# Patient Record
Sex: Male | Born: 1945 | Race: White | Hispanic: No | Marital: Married | Smoking: Never smoker
Health system: Southern US, Community
[De-identification: ages and names within clinical notes are randomized; demographics above are authoritative.]

## PROBLEM LIST (undated history)

## (undated) DIAGNOSIS — N189 Chronic kidney disease, unspecified: Secondary | ICD-10-CM

## (undated) DIAGNOSIS — G35 Multiple sclerosis: Secondary | ICD-10-CM

## (undated) HISTORY — PX: CHOLECYSTECTOMY: SHX55

## (undated) HISTORY — PX: BACK SURGERY: SHX140

## (undated) HISTORY — PX: EYE SURGERY: SHX253

## (undated) HISTORY — DX: Chronic kidney disease, unspecified: N18.9

---

## 2019-02-03 DIAGNOSIS — N312 Flaccid neuropathic bladder, not elsewhere classified: Secondary | ICD-10-CM | POA: Diagnosis present

## 2019-04-11 DIAGNOSIS — G35 Multiple sclerosis: Secondary | ICD-10-CM | POA: Insufficient documentation

## 2020-04-03 ENCOUNTER — Other Ambulatory Visit: Payer: Self-pay

## 2020-04-03 ENCOUNTER — Emergency Department (HOSPITAL_COMMUNITY)
Admission: EM | Admit: 2020-04-03 | Discharge: 2020-04-03 | Disposition: A | Discharge status: Home / Self Care | Payer: Medicare Other | Attending: Emergency Medicine | Admitting: Emergency Medicine

## 2020-04-03 ENCOUNTER — Encounter (HOSPITAL_COMMUNITY): Payer: Self-pay | Admitting: Emergency Medicine

## 2020-04-03 DIAGNOSIS — R008 Other abnormalities of heart beat: Secondary | ICD-10-CM | POA: Diagnosis not present

## 2020-04-03 DIAGNOSIS — F17200 Nicotine dependence, unspecified, uncomplicated: Secondary | ICD-10-CM | POA: Insufficient documentation

## 2020-04-03 DIAGNOSIS — R001 Bradycardia, unspecified: Secondary | ICD-10-CM | POA: Insufficient documentation

## 2020-04-03 DIAGNOSIS — I498 Other specified cardiac arrhythmias: Secondary | ICD-10-CM

## 2020-04-03 HISTORY — DX: Multiple sclerosis: G35

## 2020-04-03 LAB — BASIC METABOLIC PANEL
Anion gap: 9 (ref 5–15)
BUN: 10 mg/dL (ref 8–23)
CO2: 27 mmol/L (ref 22–32)
Calcium: 9.1 mg/dL (ref 8.9–10.3)
Chloride: 103 mmol/L (ref 98–111)
Creatinine, Ser: 0.81 mg/dL (ref 0.61–1.24)
GFR, Estimated: >60 mL/min (ref >60)
Glucose, Bld: 100 mg/dL — ABNORMAL HIGH (ref 70–99)
Potassium: 4.2 mmol/L (ref 3.5–5.1)
Sodium: 139 mmol/L (ref 135–145)

## 2020-04-03 LAB — CBC
HCT: 44.2 % (ref 39.0–52.0)
Hemoglobin: 14.1 g/dL (ref 13.0–17.0)
MCH: 31.7 pg (ref 26.0–34.0)
MCHC: 31.9 g/dL (ref 30.0–36.0)
MCV: 99.3 fL (ref 80.0–100.0)
Platelets: 236 10*3/uL (ref 150–400)
RBC: 4.45 MIL/uL (ref 4.22–5.81)
RDW: 13.1 % (ref 11.5–15.5)
WBC: 4.6 10*3/uL (ref 4.0–10.5)
nRBC: 0 % (ref 0.0–0.2)

## 2020-04-03 LAB — TSH: TSH: 1.718 u[IU]/mL (ref 0.350–4.500)

## 2020-04-03 LAB — TROPONIN I (HIGH SENSITIVITY): Troponin I (High Sensitivity): 5 ng/L (ref <18)

## 2020-04-03 LAB — MAGNESIUM: Magnesium: 2 mg/dL (ref 1.7–2.4)

## 2020-04-03 NOTE — Discharge Instructions (Addendum)
Contact a health care provider if you: Feel light-headed or dizzy. Almost faint. Feel weak or are easily fatigued during physical activity. Experience confusion or have memory problems. Get help right away if: You faint. You have: An irregular heartbeat (palpitations). Chest pain. Trouble breathing.

## 2020-04-03 NOTE — ED Provider Notes (Signed)
[facility] EMERGENCY DEPARTMENT Provider Note   CSN: [id number] Arrival date & time: 04/03/20  1247     History Chief Complaint  Patient presents with  . Bradycardia    Ronald Santiago is a 75 y.o. 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  The patient has had 2 prior episodes of the same with rates in the 40s and associated hypotension in July 2021 and August 2021.  Each time he was recommended to come to the ER however he did not want to go.  His wife who is at bedside states that it self resolved by the evening.  The patient has never been symptomatic from these episodes.  Today his nurse took his pulse which was in the 30s and stated that he needed to come to the emergency department immediately.  His blood pressure was in the 90s systolic.  He states that his normal blood pressure is about 110 systolic.  He denies feeling weak, lightheaded, presyncopal, having chest pain, shortness of breath, nausea or diaphoresis.  HPI     Past Medical History:  Diagnosis Date  . Multiple sclerosis, primary progressive (HCC)     There are no problems to display for this patient.   Past Surgical History:  Procedure Laterality Date  . BACK SURGERY    . CHOLECYSTECTOMY    . EYE SURGERY         History reviewed. No pertinent family history.  Social History   Tobacco Use  . Smoking status: Current Some Day Smoker  . Smokeless tobacco: Never Used    Home Medications Prior to Admission medications   Not on File    Allergies    Patient has no known allergies.  Review of Systems   Review of Systems Ten systems reviewed and are negative for acute change, except as noted in the HPI.   Physical Exam Updated Vital Signs BP 130/60   Pulse 66   Temp 98.2 F (36.8 C) (Oral)   Resp (!) 22    Ht 6' (1.829 m)   Wt 74.8 kg   SpO2 97%   BMI 22.38 kg/m   Physical Exam Vitals and nursing note reviewed.  Constitutional:      General: He is not in acute distress.    Appearance: He is well-developed and well-nourished. He is not diaphoretic.  HENT:     Head: Normocephalic and atraumatic.  Eyes:     General: No scleral icterus.    Conjunctiva/sclera: Conjunctivae normal.  Cardiovascular:     Rate and Rhythm: Normal rate and regular rhythm.     Heart sounds: Normal heart sounds.  Pulmonary:     Effort: Pulmonary effort is normal. No respiratory distress.     Breath sounds: Normal breath sounds.  Abdominal:     Palpations: Abdomen is soft.     Tenderness: There is no abdominal tenderness.     Comments: Suprapubic catheter in place  Musculoskeletal:        General: No edema.     Cervical back: Normal range of motion and neck supple.  Skin:    General: Skin is warm and dry.  Neurological:     Mental Status: He is alert.  Psychiatric:        Behavior: Behavior normal.     ED Results / Procedures / Treatments  Labs (all labs ordered are listed, but only abnormal results are displayed) Labs Reviewed  BASIC METABOLIC PANEL - Abnormal; Notable for the following components:      Result Value   Glucose, Bld 100 (*)    All other components within normal limits  CBC  MAGNESIUM  TSH  URINALYSIS, ROUTINE W REFLEX MICROSCOPIC  CBG MONITORING, ED  TROPONIN I (HIGH SENSITIVITY)  TROPONIN I (HIGH SENSITIVITY)    EKG EKG Interpretation  Date/Time:  Wednesday April 03 2020 18:29:45 EST Ventricular Rate:  67 PR Interval:    QRS Duration: 87 QT Interval:  400 QTC Calculation: 423 R Axis:   61 Text Interpretation: Sinus rhythm Ventricular trigeminy Abnormal ECG Confirmed by Gerhard Munch [phone]) on 04/03/2020 6:33:19 PM   Radiology No results found.  Procedures Procedures (including critical care time)  Medications Ordered in ED Medications - No data to  display  ED Course  I have reviewed the triage vital signs and the nursing notes.  Pertinent labs & imaging results that were available during my care of the patient were reviewed by me and considered in my medical decision making (see chart for details).    MDM Rules/Calculators/A&P                         75 year old male here with episodic bradycardia which is asymptomatic for the patient.  Patient appears to be having runs of ventricular bigeminy and trigeminy.  He does not feel presyncopal.  His blood pressures have been fairly stable here.  I ordered and reviewed labs which include troponin which is negative, CBC without abnormality, mag within normal limits, normal TSH, BMP without significant abnormality.  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  Patient seen and shared visit with Dr. Jeraldine Loots.  As the patient is asymptomatic do not feel that there is any significant need for intervention or admission at this time.  He may follow very closely in the outpatient setting with cardiology.  He should return to immediately if he becomes symptomatic in these states of slow heart rate. Final Clinical Impression(s) / ED Diagnoses Final diagnoses:  Bradycardia  Ventricular bigeminy    Rx / DC Orders ED Discharge Orders    None       Arthor Captain, PA-C 04/03/20 2347    Gerhard Munch, MD 04/04/20 [phone]

## 2020-04-03 NOTE — ED Notes (Signed)
Patient Alert and oriented to baseline. Stable and ambulatory to baseline. Patient verbalized understanding of the discharge instructions.  Patient belongings were taken by the patient.   

## 2020-04-03 NOTE — ED Triage Notes (Addendum)
Pt arrives to ED via [facility] with c/o of bradycardia. Pt lives at home where a [facility] nurse comes x1 a week, ran vitals today and found pts pulse to be 30. EMS found pt to be in ventricular bigeminy. Pt states he feels normal and at his baseline. Pt does have primary progressive MS. No CP, SOB, or increased lethargy. Has sacral wound and suprapubic catheter.

## 2020-06-20 DIAGNOSIS — R001 Bradycardia, unspecified: Secondary | ICD-10-CM | POA: Insufficient documentation

## 2020-06-20 DIAGNOSIS — I493 Ventricular premature depolarization: Secondary | ICD-10-CM | POA: Insufficient documentation

## 2020-06-24 ENCOUNTER — Emergency Department
Admission: EM | Admit: 2020-06-24 | Discharge: 2020-06-24 | Disposition: A | Discharge status: Home / Self Care | Payer: Medicare Other | Attending: Emergency Medicine | Admitting: Emergency Medicine

## 2020-06-24 ENCOUNTER — Emergency Department: Payer: Medicare Other

## 2020-06-24 ENCOUNTER — Other Ambulatory Visit: Payer: Self-pay

## 2020-06-24 DIAGNOSIS — Z9049 Acquired absence of other specified parts of digestive tract: Secondary | ICD-10-CM | POA: Diagnosis not present

## 2020-06-24 DIAGNOSIS — N390 Urinary tract infection, site not specified: Secondary | ICD-10-CM

## 2020-06-24 DIAGNOSIS — I700 Atherosclerosis of aorta: Secondary | ICD-10-CM | POA: Diagnosis not present

## 2020-06-24 DIAGNOSIS — F17200 Nicotine dependence, unspecified, uncomplicated: Secondary | ICD-10-CM | POA: Insufficient documentation

## 2020-06-24 DIAGNOSIS — R319 Hematuria, unspecified: Secondary | ICD-10-CM

## 2020-06-24 DIAGNOSIS — J984 Other disorders of lung: Secondary | ICD-10-CM | POA: Diagnosis not present

## 2020-06-24 DIAGNOSIS — D7389 Other diseases of spleen: Secondary | ICD-10-CM | POA: Diagnosis not present

## 2020-06-24 DIAGNOSIS — K7689 Other specified diseases of liver: Secondary | ICD-10-CM | POA: Insufficient documentation

## 2020-06-24 LAB — URINALYSIS, COMPLETE (UACMP) WITH MICROSCOPIC
Bacteria, UA: NONE SEEN
Bilirubin Urine: NEGATIVE
Glucose, UA: NEGATIVE mg/dL
Ketones, ur: NEGATIVE mg/dL
Nitrite: NEGATIVE
Protein, ur: 100 mg/dL — AB
Specific Gravity, Urine: 1.026 (ref 1.005–1.030)
Squamous Epithelial / HPF: NONE SEEN (ref 0–5)
WBC, UA: NONE SEEN WBC/hpf (ref 0–5)
pH: 5 (ref 5.0–8.0)

## 2020-06-24 LAB — CBC
HCT: 45.5 % (ref 39.0–52.0)
Hemoglobin: 15.8 g/dL (ref 13.0–17.0)
MCH: 33.3 pg (ref 26.0–34.0)
MCHC: 34.7 g/dL (ref 30.0–36.0)
MCV: 95.8 fL (ref 80.0–100.0)
Platelets: 244 10*3/uL (ref 150–400)
RBC: 4.75 MIL/uL (ref 4.22–5.81)
RDW: 13.5 % (ref 11.5–15.5)
WBC: 8 10*3/uL (ref 4.0–10.5)
nRBC: 0 % (ref 0.0–0.2)

## 2020-06-24 LAB — COMPREHENSIVE METABOLIC PANEL
ALT: 16 U/L (ref 0–44)
AST: 23 U/L (ref 15–41)
Albumin: 3.8 g/dL (ref 3.5–5.0)
Alkaline Phosphatase: 39 U/L (ref 38–126)
Anion gap: 9 (ref 5–15)
BUN: 12 mg/dL (ref 8–23)
CO2: 27 mmol/L (ref 22–32)
Calcium: 9.1 mg/dL (ref 8.9–10.3)
Chloride: 101 mmol/L (ref 98–111)
Creatinine, Ser: 0.9 mg/dL (ref 0.61–1.24)
GFR, Estimated: >60 mL/min (ref >60)
Glucose, Bld: 94 mg/dL (ref 70–99)
Potassium: 4.4 mmol/L (ref 3.5–5.1)
Sodium: 137 mmol/L (ref 135–145)
Total Bilirubin: 0.7 mg/dL (ref 0.3–1.2)
Total Protein: 7.7 g/dL (ref 6.5–8.1)

## 2020-06-24 MED ORDER — CEPHALEXIN 500 MG PO CAPS: 500.0000 mg | ORAL_CAPSULE | Freq: Two times a day (BID) | ORAL | 0 refills | Status: DC

## 2020-06-24 MED ORDER — PHENAZOPYRIDINE HCL 200 MG PO TABS: 200.0000 mg | ORAL_TABLET | Freq: Three times a day (TID) | ORAL | 0 refills | Status: DC | PRN

## 2020-06-24 MED ORDER — LEVOFLOXACIN 500 MG PO TABS: 500.0000 mg | ORAL_TABLET | Freq: Every day | ORAL | 0 refills | Status: AC

## 2020-06-24 MED ORDER — SODIUM CHLORIDE 0.9 % IV BOLUS
1000.0000 mL | Freq: Once | INTRAVENOUS | Status: AC
  Administered 2020-06-24: 1000 mL via INTRAVENOUS

## 2020-06-24 MED ORDER — IOHEXOL 300 MG/ML  SOLN
100.0000 mL | Freq: Once | INTRAMUSCULAR | Status: AC | PRN
  Administered 2020-06-24: 100 mL via INTRAVENOUS

## 2020-06-24 NOTE — ED Triage Notes (Addendum)
Pt comes with c/o blood in urine. Pt has suprapubic cath. Pt states last Tuesday his home health nurse came and attempted to change his foley multiple times. Pt states it was very painful and blood was present. Pt states she did it a 3rd time and left it in and advised them that if they didn't see any urine to seek medical attention.  Pt states 3 hours later he noticed urine but now it is cloudy and bloody. Pt states severe pain to penis area.  Pt denies any belly pain.

## 2020-06-24 NOTE — ED Notes (Signed)
Patient transported to CT 

## 2020-06-24 NOTE — ED Provider Notes (Addendum)
[facility] Emergency Department Provider Note  Time seen: 6:24 PM  I have reviewed the triage vital signs and the nursing notes.   HISTORY  Chief Complaint Hematuria   HPI Ronald Santiago is a 75 y.o. male with a past medical history of multiple sclerosis who presents to the emergency department for hematuria and lower abdominal discomfort.  According to the patient he had his catheter exchanged on Tuesday.  States normally when he has a suprapubic catheter exchange it is painless however during that exchange he states significant mental pain followed by bleeding.  The catheter was removed and a new catheter was placed patient has continued to have intermittent hematuria ever since.  Also states he is having lower abdominal pain and today noted blood coming from his penis.   Past Medical History:  Diagnosis Date  . Multiple sclerosis, primary progressive (HCC)     There are no problems to display for this patient.   Past Surgical History:  Procedure Laterality Date  . BACK SURGERY    . CHOLECYSTECTOMY    . EYE SURGERY      Prior to Admission medications   Not on File    No Known Allergies  No family history on file.  Social History Social History   Tobacco Use  . Smoking status: Current Some Day Smoker  . Smokeless tobacco: Never Used    Review of Systems Constitutional: Negative for fever. Cardiovascular: Negative for chest pain. Gastrointestinal: Lower abdominal discomfort. Genitourinary: Intermittent hematuria. Musculoskeletal: Negative for musculoskeletal complaints Neurological: Negative for headache All other ROS negative  ____________________________________________   PHYSICAL EXAM:  VITAL SIGNS: ED Triage Vitals  Enc Vitals Group     BP 06/24/20 1340 132/83     Pulse Rate 06/24/20 1340 (!) 58     Resp 06/24/20 1340 18     Temp 06/24/20 1340 98.2 F (36.8 C)     Temp Source 06/24/20 1340 Oral     SpO2 06/24/20 1340 93 %      Weight 06/24/20 1340 170 lb (77.1 kg)     Height 06/24/20 1340 6' (1.829 m)     Head Circumference --      Peak Flow --      Pain Score 06/24/20 1339 5     Pain Loc --      Pain Edu? --      Excl. in GC? --    Constitutional: Alert and oriented. Well appearing and in no distress. Eyes: Normal exam ENT      Head: Normocephalic and atraumatic.      Mouth/Throat: Mucous membranes are moist. Cardiovascular: Normal rate, regular rhythm.  Respiratory: Normal respiratory effort without tachypnea nor retractions. Breath sounds are clear  Gastrointestinal: Suprapubic catheter present very mild suprapubic tenderness.  No distention.  Abdomen otherwise benign. Musculoskeletal: Nontender with normal range of motion in all extremities.  Neurologic:  Normal speech and language. No gross focal neurologic deficits  Skin:  Skin is warm, dry and intact.  Psychiatric: Mood and affect are normal.   ____________________________________________   RADIOLOGY  CT scans essentially negative for acute abnormality besides mild bladder wall thickening.  Large stool burden.  ____________________________________________   INITIAL IMPRESSION / ASSESSMENT AND PLAN / ED COURSE  Pertinent labs & imaging results that were available during my care of the patient were reviewed by me and considered in my medical decision making (see chart for details).   Patient presents emergency department for intermittent hematuria and suprapubic discomfort.  Patient has a suprapubic catheter and states ever since it was replaced on Tuesday he has been having intermittent hematuria and discomfort.  Currently the patient appears well, very mild suprapubic tenderness.  Urine appears somewhat cloudy.  Urinalysis has resulted showing white blood cells but no significant red blood cells or bacteria.  We will send a urine culture.  I spoke to radiology regarding the patient's pain after Foley catheter was placed.  We decided proceed  with imaging of the abdomen including a CT cystogram to rule out bladder wall rupture or other abnormality.  Patient CT scans are essentially negative/reassuring besides mild bladder wall thickening along with large amount of leukocytes we will treat for possible urinary infection while the urine culture is pending.  Patient to follow-up with urology.  Patient states he has an allergy to Keflex in the past.  We will discontinue the Keflex prescription and changed to Levaquin.  Ronald Santiago was evaluated in Emergency Department on 06/24/2020 for the symptoms described in the history of present illness. He was evaluated in the context of the global COVID-19 pandemic, which necessitated consideration that the patient might be at risk for infection with the SARS-CoV-2 virus that causes COVID-19. Institutional protocols and algorithms that pertain to the evaluation of patients at risk for COVID-19 are in a state of rapid change based on information released by regulatory bodies including the CDC and federal and state organizations. These policies and algorithms were followed during the patient's care in the ED.  ____________________________________________   FINAL CLINICAL IMPRESSION(S) / ED DIAGNOSES  Hematuria UTI   Minna Antis, MD 06/24/20 Lacie Scotts, MD 06/24/20 1845

## 2020-06-27 LAB — URINE CULTURE: Culture: >=100000 — AB

## 2020-07-08 ENCOUNTER — Encounter: Payer: Self-pay | Admitting: Urology

## 2020-07-08 ENCOUNTER — Ambulatory Visit (INDEPENDENT_AMBULATORY_CARE_PROVIDER_SITE_OTHER): Payer: Medicare Other | Admitting: Urology

## 2020-07-08 ENCOUNTER — Other Ambulatory Visit: Payer: Self-pay

## 2020-07-08 VITALS — BP 110/73 | HR 56 | Ht 72.0 in | Wt 170.0 lb

## 2020-07-08 DIAGNOSIS — R300 Dysuria: Secondary | ICD-10-CM | POA: Diagnosis not present

## 2020-07-08 DIAGNOSIS — R310 Gross hematuria: Secondary | ICD-10-CM | POA: Diagnosis not present

## 2020-07-08 MED ORDER — CIPROFLOXACIN HCL 250 MG PO TABS: 250.0000 mg | ORAL_TABLET | Freq: Two times a day (BID) | ORAL | 0 refills | Status: DC

## 2020-07-08 NOTE — Progress Notes (Signed)
07/08/2020 12:57 PM   Ronald Santiago September 23, 1945 [id number]  Referring provider: Mick Sell, MD [address] [place],  [place] [address]  No chief complaint on file.   HPI: I was consulted to assess the patient who has a chronic suprapubic tube since December 2020 placed in [place].  Home health changes it uneventfully monthly.  On March 29 when it was changed the patient had blood from the penis blood in the urine and pain.  The bleeding gradually settle.  Bleeding came back April 4 and went to the emergency room.  He had a CT scan was placed on Levaquin which is now finished.  With certain movements or deep breath he feels some burning at the tip of the penis.  Otherwise he is back to baseline.  Sometimes his abdomen gets distended and then it goes down.  The catheter may or may not be draining well at the time but overall it appears to be draining well.  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  He has chronic sacral wounds.  He went to the emergency room with abdominal pain and blood from the penis.  ET scan demonstrated suprapubic catheter in place.  Mild bladder wall thickening.  No overdistended.  He had a positive urine culture sensitive to ciprofloxacin and no sensitivity done on Levaquin.  No other oral antibiotics sensitive to   PMH: Past Medical History:  Diagnosis Date  . Multiple sclerosis, primary progressive [facility])     Surgical History: Past Surgical History:  Procedure Laterality Date  . BACK SURGERY    . CHOLECYSTECTOMY    . EYE SURGERY      Home Medications:  Allergies as of 07/08/2020      Reactions   Cephalexin Anaphylaxis, Hives      Medication List       Accurate as of July 08, 2020 12:57 PM. If you have any questions, ask your nurse or doctor.        phenazopyridine 200 MG tablet Commonly known as: Pyridium Take 1 tablet (200 mg total) by mouth 3 (three) times daily as needed for pain.       Allergies:   Allergies  Allergen Reactions  . Cephalexin Anaphylaxis and Hives    Family History: No family history on file.  Social History:  reports that he has been smoking. He has never used smokeless tobacco. No history on file for alcohol use and drug use.  ROS:                                        Physical Exam: There were no vitals taken for this visit.  Constitutional:  Alert and oriented, No acute distress. HEENT: [place] AT, moist mucus membranes.  Trachea midline, no masses. Cardiovascular: No clubbing, cyanosis, or edema. Respiratory: Normal respiratory effort, no increased work of breathing. GI: Abdomen is soft, nontender, nondistended, no abdominal masses GU: No CVA tenderness.  Prepubic tube with clear urine Skin: No rashes, bruises or suspicious lesions. Lymph: No cervical or inguinal adenopathy. Neurologic: Grossly intact, no focal deficits, moving all 4 extremities. Psychiatric: Normal mood and affect.  Laboratory Data: Lab Results  Component Value Date   WBC 8.0 06/24/2020   HGB 15.8 06/24/2020   HCT 45.5 06/24/2020   MCV 95.8 06/24/2020   PLT 244 06/24/2020    Lab Results  Component Value Date   CREATININE  0.90 06/24/2020    No results found for: PSA  No results found for: TESTOSTERONE  No results found for: HGBA1C  Urinalysis    Component Value Date/Time   COLORURINE YELLOW (A) 06/24/2020 1342   APPEARANCEUR TURBID (A) 06/24/2020 1342   LABSPEC 1.026 06/24/2020 1342   PHURINE 5.0 06/24/2020 1342   GLUCOSEU NEGATIVE 06/24/2020 1342   HGBUR SMALL (A) 06/24/2020 1342   BILIRUBINUR NEGATIVE 06/24/2020 1342   KETONESUR NEGATIVE 06/24/2020 1342   PROTEINUR 100 (A) 06/24/2020 1342   NITRITE NEGATIVE 06/24/2020 1342   LEUKOCYTESUR LARGE (A) 06/24/2020 1342    Pertinent Imaging: Chart reviewed.  Urine reviewed.  Urine sent for culture.  Assessment & Plan: Patient is doing well and should continue with suprapubic catheter  changes.  Discomfort at tip of penis may be from residual cystitis with referred discomfort.  Because of the sensitivity I gave ciprofloxacin for another week.  We sent a urine for culture recognizing he could be colonized.  Recheck patient again in 6 months.  There are no diagnoses linked to this encounter.  No follow-ups on file.  Martina Sinner, MD  [facility] [address] [place], [place] [address] [phone]

## 2020-07-08 NOTE — Addendum Note (Signed)
Addended by: Milas Kocher A on: 07/08/2020 01:40 PM   Modules accepted: Orders

## 2020-07-11 LAB — CULTURE, URINE COMPREHENSIVE

## 2020-07-12 ENCOUNTER — Telehealth: Payer: Self-pay

## 2020-07-12 NOTE — Telephone Encounter (Signed)
-----   Message from Alfredo Martinez, MD sent at 07/12/2020 11:52 AM EDT -----   Please call patient and see if he still having pain at the tip of the penis since he took ciprofloxacin. If he still having the pain please have him try  fosfomycin 3 grm x1 dose.  There are not other medications that we can use orally          ----- Message ----- From: Lizbeth Bark, CMA Sent: 07/11/2020   2:14 PM EDT To: Alfredo Martinez, MD   ----- Message ----- From: Interface, [facility] Lab Results In Sent: 07/10/2020   7:37 AM EDT To: Jennette Kettle Clinical

## 2020-07-12 NOTE — Telephone Encounter (Signed)
Patient is no longer having pain.

## 2021-01-13 ENCOUNTER — Ambulatory Visit: Payer: Self-pay | Admitting: Urology

## 2021-01-24 ENCOUNTER — Other Ambulatory Visit: Payer: Self-pay

## 2021-01-24 ENCOUNTER — Emergency Department
Admission: EM | Admit: 2021-01-24 | Discharge: 2021-01-24 | Disposition: A | Discharge status: Home / Self Care | Payer: Medicare Other | Attending: Emergency Medicine | Admitting: Emergency Medicine

## 2021-01-24 DIAGNOSIS — Y833 Surgical operation with formation of external stoma as the cause of abnormal reaction of the patient, or of later complication, without mention of misadventure at the time of the procedure: Secondary | ICD-10-CM | POA: Diagnosis not present

## 2021-01-24 DIAGNOSIS — N99518 Other cystostomy complication: Secondary | ICD-10-CM | POA: Diagnosis not present

## 2021-01-24 DIAGNOSIS — F17200 Nicotine dependence, unspecified, uncomplicated: Secondary | ICD-10-CM | POA: Insufficient documentation

## 2021-01-24 DIAGNOSIS — N390 Urinary tract infection, site not specified: Secondary | ICD-10-CM | POA: Insufficient documentation

## 2021-01-24 DIAGNOSIS — T83010A Breakdown (mechanical) of cystostomy catheter, initial encounter: Secondary | ICD-10-CM

## 2021-01-24 DIAGNOSIS — N189 Chronic kidney disease, unspecified: Secondary | ICD-10-CM | POA: Diagnosis not present

## 2021-01-24 DIAGNOSIS — B9689 Other specified bacterial agents as the cause of diseases classified elsewhere: Secondary | ICD-10-CM | POA: Insufficient documentation

## 2021-01-24 LAB — URINALYSIS, ROUTINE W REFLEX MICROSCOPIC
Bacteria, UA: NONE SEEN
Bilirubin Urine: NEGATIVE
Glucose, UA: NEGATIVE mg/dL
Ketones, ur: NEGATIVE mg/dL
Nitrite: NEGATIVE
Protein, ur: 30 mg/dL — AB
RBC / HPF: >50 RBC/hpf — ABNORMAL HIGH (ref 0–5)
Specific Gravity, Urine: 1.008 (ref 1.005–1.030)
WBC, UA: >50 WBC/hpf — ABNORMAL HIGH (ref 0–5)
pH: 7 (ref 5.0–8.0)

## 2021-01-24 MED ORDER — CIPROFLOXACIN HCL 500 MG PO TABS
500.0000 mg | ORAL_TABLET | Freq: Once | ORAL | Status: AC
  Administered 2021-01-24: 500 mg via ORAL
  Filled 2021-01-24: qty 1

## 2021-01-24 MED ORDER — CIPROFLOXACIN HCL 500 MG PO TABS: 500.0000 mg | ORAL_TABLET | Freq: Two times a day (BID) | ORAL | 0 refills | Status: AC

## 2021-01-24 NOTE — ED Provider Notes (Signed)
[facility] Emergency Department Provider Note  ____________________________________________   Event Date/Time   First MD Initiated Contact with Patient 01/24/21 0217     (approximate)  I have reviewed the triage vital signs and the nursing notes.   HISTORY  Chief Complaint Urinary catheter problems     HPI Ronald Santiago is a 75 y.o. male with history of multiple sclerosis with a suprapubic catheter who presents to the emergency department with his suprapubic catheter being clogged.  States it was initially placed in 2020.  States that home health nurse came and replaced his catheter today at 5 PM.  States since that time he has not had any urine output other than a small amount of bloody appearing urine.  Wife attempted to irrigate his catheter multiple times and states that every time she irrigated his catheter fluid just came out of his penis.  He is starting to have lower abdominal discomfort.  Has a 8 [place] in place.        Past Medical History:  Diagnosis Date   Chronic kidney disease    Multiple sclerosis, primary progressive (HCC)     There are no problems to display for this patient.   Past Surgical History:  Procedure Laterality Date   BACK SURGERY     CHOLECYSTECTOMY     EYE SURGERY      Prior to Admission medications   Medication Sig Start Date End Date Taking? Authorizing Provider  ciprofloxacin (CIPRO) 500 MG tablet Take 1 tablet (500 mg total) by mouth 2 (two) times daily for 7 days. 01/24/21 01/31/21 Yes Kelci Petrella, Layla Maw, DO  baclofen (LIORESAL) 10 MG tablet Take 10 mg by mouth 4 (four) times daily. 05/21/20   [provider]  Cholecalciferol (D3-1000 PO) Take by mouth.    [provider]  clonazePAM (KLONOPIN) 1 MG tablet Take 1 mg by mouth at bedtime as needed. 07/02/20   [provider]  Docusate Sodium (DSS) 100 MG CAPS Take by mouth.    [provider]  gabapentin (NEURONTIN) 600 MG tablet Take  by mouth. 02/14/19   [provider]  Magnesium 400 MG TABS Take by mouth.    [provider]  MOVANTIK 25 MG TABS tablet Take 25 mg by mouth daily. 05/21/20   [provider]  nystatin (MYCOSTATIN/NYSTOP) powder SMARTSIG:1 Application Topical 2-3 Times Daily 06/13/20   [provider]  oxybutynin (DITROPAN) 5 MG tablet Take 5 mg by mouth 3 (three) times daily. 06/02/20   [provider]  Oxycodone HCl 10 MG TABS 10 mg. 04/11/19   [provider]  Probiotic Product (PROBIOTIC-10 PO) Take by mouth.    [provider]    Allergies Cephalexin  No family history on file.  Social History Social History   Tobacco Use   Smoking status: Some Days   Smokeless tobacco: Never    Review of Systems Constitutional: No fever. Eyes: No visual changes. ENT: No sore throat. Cardiovascular: Denies chest pain. Respiratory: Denies shortness of breath. Gastrointestinal: No nausea, vomiting, diarrhea. Genitourinary: Negative for dysuria. Musculoskeletal: Negative for back pain. Skin: Negative for rash. Neurological: Negative for focal weakness or numbness.  ____________________________________________   PHYSICAL EXAM:  VITAL SIGNS: ED Triage Vitals  Enc Vitals Group     BP 01/24/21 0201 101/63     Pulse Rate 01/24/21 0201 78     Resp 01/24/21 0201 20     Temp 01/24/21 0201 98.3 F (36.8 C)  Temp Source 01/24/21 0201 Oral     SpO2 01/24/21 0201 100 %     Weight 01/24/21 0202 180 lb (81.6 kg)     Height 01/24/21 0202 6' (1.829 m)     Head Circumference --      Peak Flow --      Pain Score 01/24/21 0201 7     Pain Loc --      Pain Edu? --      Excl. in GC? --    CONSTITUTIONAL: Alert and oriented and responds appropriately to questions.  Appears uncomfortable but not in distress, elderly HEAD: Normocephalic EYES: Conjunctivae clear, pupils appear equal, EOM appear intact ENT: normal nose; moist mucous membranes NECK:  Supple, normal ROM CARD: RRR; S1 and S2 appreciated; no murmurs, no clicks, no rubs, no gallops RESP: Normal chest excursion without splinting or tachypnea; breath sounds clear and equal bilaterally; no wheezes, no rhonchi, no rales, no hypoxia or respiratory distress, speaking full sentences ABD/GI: Normal bowel sounds; soft, slightly tender in the suprapubic area, suprapubic catheter in place without surrounding redness, warmth, drainage; small amount of bloody appearing urine in his catheter BACK: The back appears normal EXT: Normal ROM in all joints; no deformity noted, no edema; no cyanosis SKIN: Normal color for age and race; warm; no rash on exposed skin NEURO: Moves all extremities equally PSYCH: The patient's mood and manner are appropriate.  ____________________________________________   LABS (all labs ordered are listed, but only abnormal results are displayed)  Labs Reviewed  URINALYSIS, ROUTINE W REFLEX MICROSCOPIC - Abnormal; Notable for the following components:      Result Value   Color, Urine YELLOW (*)    APPearance CLOUDY (*)    Hgb urine dipstick LARGE (*)    Protein, ur 30 (*)    Leukocytes,Ua LARGE (*)    RBC / HPF >50 (*)    WBC, UA >50 (*)    All other components within normal limits  URINE CULTURE   ____________________________________________  EKG   ____________________________________________  RADIOLOGY I, Kinza Gouveia, personally viewed and evaluated these images (plain radiographs) as part of my medical decision making, as well as reviewing the written report by the radiologist.  ED MD interpretation:    Official radiology report(s): No results found.  ____________________________________________   PROCEDURES  Procedure(s) performed (including Critical Care):  Procedures   ____________________________________________   INITIAL IMPRESSION / ASSESSMENT AND PLAN / ED COURSE  As part of my medical decision making, I reviewed the  following data within the electronic MEDICAL RECORD NUMBER History obtained from family, Nursing notes reviewed and incorporated, Labs reviewed , Old chart reviewed, and Notes from prior ED visits         Patient here with suprapubic catheter being clogged.  Wife attempted to irrigate at home without success.  Nursing staff here has also attempted to irrigate.  Will likely need to have his catheter replaced as he has approximately 700 mL currently in his bladder on bladder scan.  ED PROGRESS  In the process of changing patient's catheter it appears that the tubing from the bag to the catheter may have been occluded due to the device that was holding the catheter on patient's leg.  Once we changed the bag he began having straw appearing urine draining freely and is feeling much better.  Suprapubic catheter was not exchanged in the ED.   4:07 AM  Pt continues to have urine output and has had over 2 L drained and reports  feeling better.  Urinalysis shows large leukocytes, greater than 50,000 red blood cells, greater than 50,000 white blood cells but no bacteria.  Previous urine cultures have grown Pseudomonas sensitive to fluoroquinolones.  Will discharge on Cipro.  I feel he is safe to be discharged home with outpatient follow-up.   At this time, I do not feel there is any life-threatening condition present. I have reviewed, interpreted and discussed all results (EKG, imaging, lab, urine as appropriate) and exam findings with patient/family. I have reviewed nursing notes and appropriate previous records.  I feel the patient is safe to be discharged home without further emergent workup and can continue workup as an outpatient as needed. Discussed usual and customary return precautions. Patient/family verbalize understanding and are comfortable with this plan.  Outpatient follow-up has been provided as needed. All questions have been answered.  ____________________________________________   FINAL  CLINICAL IMPRESSION(S) / ED DIAGNOSES  Final diagnoses:  Suprapubic catheter dysfunction, initial encounter (HCC)  Acute UTI     ED Discharge Orders          Ordered    ciprofloxacin (CIPRO) 500 MG tablet  2 times daily        01/24/21 0408            *Please note:  Ronald Santiago was evaluated in Emergency Department on 01/24/2021 for the symptoms described in the history of present illness. He was evaluated in the context of the global COVID-19 pandemic, which necessitated consideration that the patient might be at risk for infection with the SARS-CoV-2 virus that causes COVID-19. Institutional protocols and algorithms that pertain to the evaluation of patients at risk for COVID-19 are in a state of rapid change based on information released by regulatory bodies including the CDC and federal and state organizations. These policies and algorithms were followed during the patient's care in the ED.  Some ED evaluations and interventions may be delayed as a result of limited staffing during and the pandemic.*   Note:  This document was prepared using Dragon voice recognition software and may include unintentional dictation errors.    Tajuana Kniskern, Layla Maw, DO 01/24/21 [phone]

## 2021-01-24 NOTE — ED Triage Notes (Signed)
Pt had suprapubic catheter replaced yesterday by home health. Pt has catheter due to MS, states was changed at 1700 yesterday. Wife states it has not been draining normally, has had increased pain to penis and bladder area. Wife states bloody tinged fluid was coming from penis and not into catheter bag. As patient was loaded into EMS truck they noted blood tinged fluid in catheter tube.

## 2021-01-24 NOTE — ED Notes (Signed)
Pts SP catheter drainage bag d/c'd with positive urine drainage by using irrigation syringe. New drainage bag attached to pre-existing SP catheter and of urine returned. MD at bedside during and d/c of new SP catheter.

## 2021-01-24 NOTE — ED Notes (Signed)
Bladder scan shows of urine.

## 2021-01-24 NOTE — Discharge Instructions (Addendum)
I recommend taking over-the-counter probiotic daily while on antibiotics.

## 2021-01-26 LAB — URINE CULTURE: Culture: >=100000 — AB

## 2021-02-03 ENCOUNTER — Ambulatory Visit (INDEPENDENT_AMBULATORY_CARE_PROVIDER_SITE_OTHER): Payer: Medicare Other | Admitting: Urology

## 2021-02-03 ENCOUNTER — Other Ambulatory Visit: Payer: Self-pay

## 2021-02-03 ENCOUNTER — Encounter: Payer: Self-pay | Admitting: Urology

## 2021-02-03 DIAGNOSIS — R339 Retention of urine, unspecified: Secondary | ICD-10-CM | POA: Diagnosis not present

## 2021-02-03 DIAGNOSIS — N1330 Unspecified hydronephrosis: Secondary | ICD-10-CM

## 2021-02-03 MED ORDER — OXYBUTYNIN CHLORIDE 5 MG PO TABS: 5.0000 mg | ORAL_TABLET | Freq: Three times a day (TID) | ORAL | 11 refills | Status: DC

## 2021-02-03 NOTE — Progress Notes (Signed)
02/03/2021 1:02 PM   Ronald Santiago October 21, 1945 [id number]  Referring provider: Mick Sell, MD [address] [place],  [place] [address]  No chief complaint on file.   HPI: I was consulted to assess the patient who has a chronic suprapubic tube since December 2020 placed in [place].  Home health changes it uneventfully monthly.  On March 29 when it was changed the patient had blood from the penis blood in the urine and pain.  The bleeding gradually settle.  Bleeding came back April 4 and went to the emergency room.  He had a CT scan was placed on Levaquin which is now finished.  With certain movements or deep breath he feels some burning at the tip of the penis.  Otherwise he is back to baseline.  Sometimes his abdomen gets distended and then it goes down.  The catheter may or may not be draining well at the time but overall it appears to be draining well.   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  He has chronic sacral wounds.  He went to the emergency room with abdominal pain and blood from the penis.  CT scan demonstrated suprapubic catheter in place.  Mild bladder wall thickening.  No overdistended.  He had a positive urine culture sensitive to ciprofloxacin and no sensitivity done on Levaquin.  No other oral antibiotics sensitive to    Patient is doing well and should continue with suprapubic catheter changes.  Discomfort at tip of penis may be from residual cystitis with referred discomfort.  Because of the sensitivity I gave ciprofloxacin for another week.  We sent a urine for culture recognizing he could be colonized.  Recheck patient again in 6 months.  Today On January 24, 2021 he went to the emergency room with a clogged suprapubic catheter.  This occurs soon after nurse to change the tube. Wife irrigated unsuccessfully.  Clinically never had UTI symptoms.  Had a positive culture.  Is just finishing Augmentin.  Currently is asymptomatic in  regards to any infection.  Tube is draining well.  Still on oxybutynin.  Had a normal CT scan April 2022 and [facility] recommended annual ultrasound   PMH: Past Medical History:  Diagnosis Date   Chronic kidney disease    Multiple sclerosis, primary progressive (HCC)     Surgical History: Past Surgical History:  Procedure Laterality Date   BACK SURGERY     CHOLECYSTECTOMY     EYE SURGERY      Home Medications:  Allergies as of 02/03/2021       Reactions   Cephalexin Anaphylaxis, Hives        Medication List        Accurate as of February 03, 2021  1:02 PM. If you have any questions, ask your nurse or doctor.          baclofen 10 MG tablet Commonly known as: LIORESAL Take 10 mg by mouth 4 (four) times daily.   clonazePAM 1 MG tablet Commonly known as: KLONOPIN Take 1 mg by mouth at bedtime as needed.   D3-1000 PO Take by mouth.   DSS 100 MG Caps Take by mouth.   gabapentin 600 MG tablet Commonly known as: NEURONTIN Take by mouth.   Magnesium 400 MG Tabs Take by mouth.   Movantik 25 MG Tabs tablet Generic drug: naloxegol oxalate Take 25 mg by mouth daily.   nystatin powder Commonly known as: MYCOSTATIN/NYSTOP SMARTSIG:1 Application Topical 2-3 Times Daily   oxybutynin  5 MG tablet Commonly known as: DITROPAN Take 5 mg by mouth 3 (three) times daily.   Oxycodone HCl 10 MG Tabs 10 mg.   PROBIOTIC-10 PO Take by mouth.        Allergies:  Allergies  Allergen Reactions   Cephalexin Anaphylaxis and Hives    Family History: No family history on file.  Social History:  reports that he has been smoking. He has never used smokeless tobacco. No history on file for alcohol use and drug use.  ROS:                                        Physical Exam: There were no vitals taken for this visit.  Constitutional:  Alert and oriented, No acute distress. HEENT: [place] AT, moist mucus membranes.  Trachea midline, no  masses.  Laboratory Data: Lab Results  Component Value Date   WBC 8.0 06/24/2020   HGB 15.8 06/24/2020   HCT 45.5 06/24/2020   MCV 95.8 06/24/2020   PLT 244 06/24/2020    Lab Results  Component Value Date   CREATININE 0.90 06/24/2020    No results found for: PSA  No results found for: TESTOSTERONE  No results found for: HGBA1C  Urinalysis    Component Value Date/Time   COLORURINE YELLOW (A) 01/24/2021 0346   APPEARANCEUR CLOUDY (A) 01/24/2021 0346   LABSPEC 1.008 01/24/2021 0346   PHURINE 7.0 01/24/2021 0346   GLUCOSEU NEGATIVE 01/24/2021 0346   HGBUR LARGE (A) 01/24/2021 0346   BILIRUBINUR NEGATIVE 01/24/2021 0346   KETONESUR NEGATIVE 01/24/2021 0346   PROTEINUR 30 (A) 01/24/2021 0346   NITRITE NEGATIVE 01/24/2021 0346   LEUKOCYTESUR LARGE (A) 01/24/2021 0346    Pertinent Imaging:   Assessment & Plan: I did not want to retreat the positive culture to cause resistance.  He is asymptomatic.  I will see him in 6 months with a annual renal ultrasound.  90x3 oxybutynin sent to pharmacy.  Continue to change suprapubic tube monthly and irrigate at least once a week by his wife  There are no diagnoses linked to this encounter.  No follow-ups on file.  Martina Sinner, MD  [facility] [address], [address] [place], [place] [address] [phone]

## 2021-02-03 NOTE — Progress Notes (Signed)
This service is provided via telemedicine   No vital signs collected/recorded due to the encounter was a telemedicine visit.     Patient consents to a telephone visit:  Yes. Reviewed all medications, pharmacy, and medical history    Names of all persons participating in the telemedicine service and their role in the encounter: Milas Kocher, CMA

## 2021-04-10 ENCOUNTER — Emergency Department: Payer: Medicare Other

## 2021-04-10 ENCOUNTER — Inpatient Hospital Stay
Admission: EM | Admit: 2021-04-10 | Discharge: 2021-04-22 | DRG: 698 | Disposition: A | Discharge status: Home Health | Payer: Medicare Other | Attending: Internal Medicine | Admitting: Internal Medicine

## 2021-04-10 ENCOUNTER — Inpatient Hospital Stay: Payer: Medicare Other

## 2021-04-10 ENCOUNTER — Other Ambulatory Visit: Payer: Self-pay

## 2021-04-10 DIAGNOSIS — Z960 Presence of urogenital implants: Secondary | ICD-10-CM | POA: Diagnosis present

## 2021-04-10 DIAGNOSIS — Z79899 Other long term (current) drug therapy: Secondary | ICD-10-CM | POA: Diagnosis not present

## 2021-04-10 DIAGNOSIS — K56609 Unspecified intestinal obstruction, unspecified as to partial versus complete obstruction: Secondary | ICD-10-CM | POA: Diagnosis present

## 2021-04-10 DIAGNOSIS — Z7189 Other specified counseling: Secondary | ICD-10-CM | POA: Diagnosis not present

## 2021-04-10 DIAGNOSIS — I517 Cardiomegaly: Secondary | ICD-10-CM | POA: Diagnosis present

## 2021-04-10 DIAGNOSIS — R824 Acetonuria: Secondary | ICD-10-CM | POA: Diagnosis present

## 2021-04-10 DIAGNOSIS — N390 Urinary tract infection, site not specified: Secondary | ICD-10-CM | POA: Diagnosis present

## 2021-04-10 DIAGNOSIS — B964 Proteus (mirabilis) (morganii) as the cause of diseases classified elsewhere: Secondary | ICD-10-CM | POA: Diagnosis present

## 2021-04-10 DIAGNOSIS — K567 Ileus, unspecified: Secondary | ICD-10-CM | POA: Diagnosis present

## 2021-04-10 DIAGNOSIS — R008 Other abnormalities of heart beat: Secondary | ICD-10-CM | POA: Diagnosis present

## 2021-04-10 DIAGNOSIS — E876 Hypokalemia: Secondary | ICD-10-CM | POA: Diagnosis present

## 2021-04-10 DIAGNOSIS — G35 Multiple sclerosis: Secondary | ICD-10-CM | POA: Diagnosis present

## 2021-04-10 DIAGNOSIS — I700 Atherosclerosis of aorta: Secondary | ICD-10-CM | POA: Diagnosis present

## 2021-04-10 DIAGNOSIS — R809 Proteinuria, unspecified: Secondary | ICD-10-CM | POA: Diagnosis present

## 2021-04-10 DIAGNOSIS — N281 Cyst of kidney, acquired: Secondary | ICD-10-CM | POA: Diagnosis present

## 2021-04-10 DIAGNOSIS — Y846 Urinary catheterization as the cause of abnormal reaction of the patient, or of later complication, without mention of misadventure at the time of the procedure: Secondary | ICD-10-CM | POA: Diagnosis present

## 2021-04-10 DIAGNOSIS — Z9049 Acquired absence of other specified parts of digestive tract: Secondary | ICD-10-CM

## 2021-04-10 DIAGNOSIS — Z742 Need for assistance at home and no other household member able to render care: Secondary | ICD-10-CM

## 2021-04-10 DIAGNOSIS — Z20822 Contact with and (suspected) exposure to covid-19: Secondary | ICD-10-CM | POA: Diagnosis present

## 2021-04-10 DIAGNOSIS — L8994 Pressure ulcer of unspecified site, stage 4: Secondary | ICD-10-CM | POA: Diagnosis present

## 2021-04-10 DIAGNOSIS — Z1623 Resistance to quinolones and fluoroquinolones: Secondary | ICD-10-CM | POA: Diagnosis present

## 2021-04-10 DIAGNOSIS — G4700 Insomnia, unspecified: Secondary | ICD-10-CM | POA: Diagnosis present

## 2021-04-10 DIAGNOSIS — N179 Acute kidney failure, unspecified: Secondary | ICD-10-CM | POA: Diagnosis present

## 2021-04-10 DIAGNOSIS — L89314 Pressure ulcer of right buttock, stage 4: Secondary | ICD-10-CM | POA: Diagnosis present

## 2021-04-10 DIAGNOSIS — T83510A Infection and inflammatory reaction due to cystostomy catheter, initial encounter: Principal | ICD-10-CM | POA: Diagnosis present

## 2021-04-10 DIAGNOSIS — R112 Nausea with vomiting, unspecified: Secondary | ICD-10-CM | POA: Diagnosis present

## 2021-04-10 DIAGNOSIS — Z888 Allergy status to other drugs, medicaments and biological substances status: Secondary | ICD-10-CM | POA: Diagnosis not present

## 2021-04-10 LAB — COMPREHENSIVE METABOLIC PANEL
ALT: 13 U/L (ref 0–44)
AST: 27 U/L (ref 15–41)
Albumin: 3.7 g/dL (ref 3.5–5.0)
Alkaline Phosphatase: 49 U/L (ref 38–126)
Anion gap: 16 — ABNORMAL HIGH (ref 5–15)
BUN: 41 mg/dL — ABNORMAL HIGH (ref 8–23)
CO2: 27 mmol/L (ref 22–32)
Calcium: 9.1 mg/dL (ref 8.9–10.3)
Chloride: 94 mmol/L — ABNORMAL LOW (ref 98–111)
Creatinine, Ser: 1.56 mg/dL — ABNORMAL HIGH (ref 0.61–1.24)
GFR, Estimated: 47 mL/min — ABNORMAL LOW (ref >60)
Glucose, Bld: 189 mg/dL — ABNORMAL HIGH (ref 70–99)
Potassium: 3.9 mmol/L (ref 3.5–5.1)
Sodium: 137 mmol/L (ref 135–145)
Total Bilirubin: 0.8 mg/dL (ref 0.3–1.2)
Total Protein: 8.6 g/dL — ABNORMAL HIGH (ref 6.5–8.1)

## 2021-04-10 LAB — CBC
HCT: 46.8 % (ref 39.0–52.0)
Hemoglobin: 15.8 g/dL (ref 13.0–17.0)
MCH: 31.1 pg (ref 26.0–34.0)
MCHC: 33.8 g/dL (ref 30.0–36.0)
MCV: 92.1 fL (ref 80.0–100.0)
Platelets: 281 10*3/uL (ref 150–400)
RBC: 5.08 MIL/uL (ref 4.22–5.81)
RDW: 13.6 % (ref 11.5–15.5)
WBC: 9 10*3/uL (ref 4.0–10.5)
nRBC: 0 % (ref 0.0–0.2)

## 2021-04-10 LAB — URINALYSIS, ROUTINE W REFLEX MICROSCOPIC
Bilirubin Urine: NEGATIVE
Glucose, UA: NEGATIVE mg/dL
Hgb urine dipstick: NEGATIVE
Ketones, ur: 20 mg/dL — AB
Nitrite: NEGATIVE
Protein, ur: >=300 mg/dL — AB
Specific Gravity, Urine: 1.02 (ref 1.005–1.030)
pH: 9 — ABNORMAL HIGH (ref 5.0–8.0)

## 2021-04-10 LAB — RESP PANEL BY RT-PCR (FLU A&B, COVID) ARPGX2
Influenza A by PCR: NEGATIVE
Influenza B by PCR: NEGATIVE
SARS Coronavirus 2 by RT PCR: NEGATIVE

## 2021-04-10 LAB — LIPASE, BLOOD: Lipase: 28 U/L (ref 11–51)

## 2021-04-10 MED ORDER — ACETAMINOPHEN 325 MG RE SUPP: 650.0000 mg | Freq: Four times a day (QID) | RECTAL | Status: DC | PRN

## 2021-04-10 MED ORDER — BACLOFEN 10 MG PO TABS
40.0000 mg | ORAL_TABLET | Freq: Every day | ORAL | Status: DC
  Administered 2021-04-11 – 2021-04-21 (×11): 40 mg via ORAL
  Filled 2021-04-10 (×13): qty 4

## 2021-04-10 MED ORDER — BACLOFEN 10 MG PO TABS
10.0000 mg | ORAL_TABLET | Freq: Four times a day (QID) | ORAL | Status: DC
  Filled 2021-04-10 (×2): qty 1

## 2021-04-10 MED ORDER — MORPHINE SULFATE (PF) 2 MG/ML IV SOLN
2.0000 mg | INTRAVENOUS | Status: DC | PRN
  Administered 2021-04-10 – 2021-04-15 (×10): 2 mg via INTRAVENOUS
  Filled 2021-04-10 (×10): qty 1

## 2021-04-10 MED ORDER — SODIUM CHLORIDE 0.9 % IV SOLN: INTRAVENOUS | Status: DC

## 2021-04-10 MED ORDER — BACLOFEN 10 MG PO TABS
20.0000 mg | ORAL_TABLET | Freq: Two times a day (BID) | ORAL | Status: DC
  Administered 2021-04-11 – 2021-04-22 (×23): 20 mg via ORAL
  Filled 2021-04-10 (×26): qty 2

## 2021-04-10 MED ORDER — OXYBUTYNIN CHLORIDE 5 MG PO TABS: 5.0000 mg | ORAL_TABLET | Freq: Three times a day (TID) | ORAL | Status: DC

## 2021-04-10 MED ORDER — ACETAMINOPHEN 325 MG PO TABS
650.0000 mg | ORAL_TABLET | Freq: Four times a day (QID) | ORAL | Status: DC | PRN
  Administered 2021-04-12 – 2021-04-16 (×2): 650 mg via ORAL
  Filled 2021-04-10 (×2): qty 2

## 2021-04-10 MED ORDER — METOCLOPRAMIDE HCL 5 MG/ML IJ SOLN
10.0000 mg | Freq: Once | INTRAMUSCULAR | Status: AC
  Administered 2021-04-10: 10 mg via INTRAVENOUS
  Filled 2021-04-10: qty 2

## 2021-04-10 MED ORDER — CLONAZEPAM 1 MG PO TABS
1.0000 mg | ORAL_TABLET | Freq: Every evening | ORAL | Status: DC | PRN
  Administered 2021-04-11 – 2021-04-17 (×4): 1 mg via ORAL
  Filled 2021-04-10 (×5): qty 1

## 2021-04-10 MED ORDER — IOHEXOL 300 MG/ML  SOLN
100.0000 mL | Freq: Once | INTRAMUSCULAR | Status: AC | PRN
  Administered 2021-04-10: 100 mL via INTRAVENOUS

## 2021-04-10 MED ORDER — ONDANSETRON HCL 4 MG/2ML IJ SOLN
4.0000 mg | Freq: Four times a day (QID) | INTRAMUSCULAR | Status: DC | PRN
  Administered 2021-04-11: 4 mg via INTRAVENOUS
  Filled 2021-04-10: qty 2

## 2021-04-10 MED ORDER — DOCUSATE SODIUM 100 MG PO CAPS: 300.0000 mg | ORAL_CAPSULE | Freq: Every day | ORAL | Status: DC

## 2021-04-10 MED ORDER — SODIUM CHLORIDE 0.9 % IV BOLUS
1000.0000 mL | Freq: Once | INTRAVENOUS | Status: AC
  Administered 2021-04-10: 1000 mL via INTRAVENOUS

## 2021-04-10 MED ORDER — HYDROMORPHONE HCL 1 MG/ML IJ SOLN
1.0000 mg | Freq: Once | INTRAMUSCULAR | Status: AC
  Administered 2021-04-10: 1 mg via INTRAVENOUS
  Filled 2021-04-10: qty 1

## 2021-04-10 MED ORDER — GABAPENTIN 600 MG PO TABS: 600.0000 mg | ORAL_TABLET | Freq: Every day | ORAL | Status: DC

## 2021-04-10 MED ORDER — MAGNESIUM HYDROXIDE 400 MG/5ML PO SUSP: 30.0000 mL | Freq: Every day | ORAL | Status: DC | PRN

## 2021-04-10 MED ORDER — TRAZODONE HCL 50 MG PO TABS
25.0000 mg | ORAL_TABLET | Freq: Every evening | ORAL | Status: DC | PRN
  Administered 2021-04-19 – 2021-04-21 (×3): 25 mg via ORAL
  Filled 2021-04-10 (×3): qty 1

## 2021-04-10 MED ORDER — ACETAMINOPHEN 325 MG PO TABS: 650.0000 mg | ORAL_TABLET | Freq: Four times a day (QID) | ORAL | Status: DC | PRN

## 2021-04-10 MED ORDER — ONDANSETRON HCL 4 MG PO TABS: 4.0000 mg | ORAL_TABLET | Freq: Four times a day (QID) | ORAL | Status: DC | PRN

## 2021-04-10 MED ORDER — NYSTATIN 100000 UNIT/GM EX POWD
Freq: Two times a day (BID) | CUTANEOUS | Status: DC
  Administered 2021-04-13: 1 via TOPICAL
  Filled 2021-04-10: qty 15

## 2021-04-10 MED ORDER — ENOXAPARIN SODIUM 40 MG/0.4ML IJ SOSY
40.0000 mg | PREFILLED_SYRINGE | INTRAMUSCULAR | Status: DC
  Administered 2021-04-10 – 2021-04-21 (×12): 40 mg via SUBCUTANEOUS
  Filled 2021-04-10 (×12): qty 0.4

## 2021-04-10 MED ORDER — GABAPENTIN 600 MG PO TABS
1200.0000 mg | ORAL_TABLET | Freq: Three times a day (TID) | ORAL | Status: DC
  Administered 2021-04-11 – 2021-04-22 (×35): 1200 mg via ORAL
  Filled 2021-04-10 (×35): qty 2

## 2021-04-10 MED ORDER — ACETAMINOPHEN 650 MG RE SUPP: 650.0000 mg | Freq: Four times a day (QID) | RECTAL | Status: DC | PRN

## 2021-04-10 MED ORDER — OXYBUTYNIN CHLORIDE 5 MG PO TABS
10.0000 mg | ORAL_TABLET | Freq: Three times a day (TID) | ORAL | Status: DC
  Administered 2021-04-11 – 2021-04-22 (×35): 10 mg via ORAL
  Filled 2021-04-10 (×36): qty 2

## 2021-04-10 MED ORDER — ENOXAPARIN SODIUM 40 MG/0.4ML IJ SOSY: 40.0000 mg | PREFILLED_SYRINGE | INTRAMUSCULAR | Status: DC

## 2021-04-10 NOTE — ED Triage Notes (Signed)
Pt arrives via [facility] from home for abd pain, n/v- vomit was described as black but not coffee ground- pt has a hx of ms and trigeminy- pt also c/o of a HA- cbg was 229- pt was given 4mg  zofran and 200 ml NS

## 2021-04-10 NOTE — ED Notes (Signed)
Wife voicing additional concerns including decreased urinary output and sacral wound.  Reports sacral wound has been assessed by PCP via tele-visit and has been swapped by home health RN.  Also, Pt recently had some upper teeth pulled and wife is concerned for an abscess d/t Pt vomiting PO antibiotics.  Primary RN aware of these concerns.

## 2021-04-10 NOTE — Consult Note (Signed)
[facility] SURGICAL CONSULTATION NOTE (initial) - [id number]   HISTORY OF PRESENT ILLNESS (HPI):  76 y.o. male presented to [facility] ED today for evaluation of abdominal pain. Patient reports the acute onset of generalized abdominal pain since early Tuesday morning. He did have a fever on Tuesday as well which was reportedly 102F. His wife gave him Motrin with improvement in this. In the last 24 hours, he had the onset of nausea and "about 17" episodes of emesis. This was described as bilious and then transitioned to "very dark" in color. No CP, SOB. His last BM was reportedly on Monday morning, but he has been passing flatus, including this morning. Previous intra-abdominal surgeries include cholecystectomy in 1980s. Additionally, he does have a history of multiple sclerosis and does have an indwelling catheter. He is on chronic opioids and multiple pain medications. He does also take Movantik and multiple stool softeners; however, he reports it is normal for him to go 2-4 days without having a BM. Work up in the ED revealed a normal WBC at 9.0K, Hgb stable at 15.8, AKI with sCr - 1.56. UA concerning for UTI. He also underwent CT Abdomen/Pelvis which was concerning for significant gastric and small bowel dilation concerning for possible obstruction.   Surgery is consulted by emergency medicine physician Dr. Valora Piccolo, MD in this context for evaluation and management of SBO.  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 Authorizing Provider  amoxicillin-clavulanate (AUGMENTIN) 875-125 MG tablet Take 1 tablet by mouth 2 (two) times daily. 12/25/20    [provider]  baclofen (LIORESAL) 10 MG tablet Take 10 mg by mouth 4 (four) times daily. 05/21/20   [provider]  Cholecalciferol (D3-1000 PO) Take by mouth.    [provider]  clonazePAM (KLONOPIN) 1 MG tablet Take 1 mg by mouth at bedtime as needed. 07/02/20   [provider]  Docusate Sodium (DSS) 100 MG CAPS Take by mouth.    [provider]  gabapentin (NEURONTIN) 600 MG tablet Take by mouth. 02/14/19   [provider]  Magnesium 400 MG TABS Take by mouth.    [provider]  MOVANTIK 25 MG TABS tablet Take 25 mg by mouth daily. 05/21/20   [provider]  nystatin (MYCOSTATIN/NYSTOP) powder SMARTSIG:1 Application Topical 2-3 Times Daily 06/13/20   [provider]  oxybutynin (DITROPAN) 5 MG tablet Take 1 tablet (5 mg total) by mouth 3 (three) times daily. 02/03/21   Bjorn Loser, MD  Oxycodone HCl 10 MG TABS 10 mg. 04/11/19   [provider]  Probiotic Product (PROBIOTIC-10 PO) Take by mouth.    [provider]     ALLERGIES:  Allergies  Allergen Reactions   Cephalexin Anaphylaxis and Hives     SOCIAL HISTORY:  Social History   Socioeconomic History   Marital status: Married    Spouse name: Not on file   Number of children: Not on file   Years of education: Not on file   Highest education level: Not on file  Occupational History   Not on file  Tobacco Use   Smoking status: Some Days   Smokeless tobacco: Never  Substance and Sexual Activity   Alcohol use: Not on file   Drug use: Not on file   Sexual activity: Not on file  Other Topics Concern   Not on file  Social History Narrative   Not on file   Social Determinants of Health   Financial Resource Strain: Not on file  Food Insecurity: Not on file  Transportation Needs: Not on file  Physical Activity: Not on file  Stress: Not on file  Social Connections: Not on file  Intimate Partner Violence: Not on file      FAMILY HISTORY:  No family history on file.    REVIEW OF SYSTEMS:  Review of Systems  Constitutional:  Positive for fever. Negative for chills.  HENT:  Negative for congestion and sore throat.   Respiratory:  Negative for cough and shortness of breath.   Cardiovascular:  Negative for chest pain and palpitations.  Gastrointestinal:  Positive for abdominal pain, nausea and vomiting. Negative for blood in stool and constipation.  Genitourinary:  Negative for dysuria and urgency.  All other systems reviewed and are negative.  VITAL SIGNS:  Temp:  [98.2 F (36.8 C)] 98.2 F (36.8 C) (01/19 1150) Pulse Rate:  [29-100] 100 (01/19 1334) Resp:  [11-16] 14 (01/19 1334) BP: (116-146)/(66-88) 146/67 (01/19 1330) SpO2:  [93 %-96 %] 95 % (01/19 1334) Weight:  [90.1 kg] 90.1 kg (01/19 1150)     Height: 6' (182.9 cm) Weight: 90.1 kg BMI (Calculated): 26.94   INTAKE/OUTPUT:  No intake/output data recorded.  PHYSICAL EXAM:  Physical Exam Vitals and nursing note reviewed. Exam conducted with a chaperone present.  Constitutional:      General: He is not in acute distress.    Appearance: He is well-developed. He is ill-appearing.     Interventions: Nasal cannula in place.     Comments: Patient resting in bed, appears ill, NAD. Wife is at bedside  HENT:     Head: Normocephalic and atraumatic.  Eyes:     General: No scleral icterus.    Extraocular Movements: Extraocular movements intact.  Cardiovascular:     Rate and Rhythm: Normal rate and regular rhythm.  Pulmonary:     Effort: Pulmonary effort is normal. No respiratory distress.  Abdominal:     General: Abdomen is protuberant. A surgical scar is present. There is distension.     Palpations: Abdomen is soft.     Tenderness: There is abdominal tenderness in the right lower quadrant, epigastric area, periumbilical area, suprapubic area and left lower quadrant. There is no guarding or rebound.     Comments: Abdomen is soft, he is tender  diffusely but seems worse in an "inverted T" shape, mildly distended, no rebound/guarding. He does not appear peritonitic. He does have previous laparoscopic cholecystectomy scars. Suprapubic catheter in place   Genitourinary:    Comments: Chronic indwelling suprapubic catheter in place Skin:    General: Skin is warm and dry.     Coloration: Skin is not jaundiced.  Neurological:     General: No focal deficit present.     Mental Status: He is alert and oriented to person, place, and time.  Psychiatric:        Mood and Affect: Mood normal.        Behavior: Behavior normal.     Labs:  CBC Latest Ref Rng & Units 04/10/2021 06/24/2020 04/03/2020  WBC 4.0 - 10.5 K/uL 9.0 8.0  4.6  Hemoglobin 13.0 - 17.0 g/dL 15.8 15.8 14.1  Hematocrit 39.0 - 52.0 % 46.8 45.5 44.2  Platelets 150 - 400 K/uL 281 244 236   CMP Latest Ref Rng & Units 04/10/2021 06/24/2020 04/03/2020  Glucose 70 - 99 mg/dL 189(H) 94 100(H)  BUN 8 - 23 mg/dL 41(H) 12 10  Creatinine 0.61 - 1.24 mg/dL 1.56(H) 0.90 0.81  Sodium 135 - 145 mmol/L 137 137 139  Potassium 3.5 - 5.1 mmol/L 3.9 4.4 4.2  Chloride 98 - 111 mmol/L 94(L) 101 103  CO2 22 - 32 mmol/L 27 27 27   Calcium 8.9 - 10.3 mg/dL 9.1 9.1 9.1  Total Protein 6.5 - 8.1 g/dL 8.6(H) 7.7 -  Total Bilirubin 0.3 - 1.2 mg/dL 0.8 0.7 -  Alkaline Phos 38 - 126 U/L 49 39 -  AST 15 - 41 U/L 27 23 -  ALT 0 - 44 U/L 13 16 -     Imaging studies:   CT Abdomen/Pelvis (04/10/2021) personally reviewed showing stomach and small bowel distension with potential subtle gradual transition point, overall picture is concerning for obstruction, and radiologist report reviewed below:  IMPRESSION: 1. Marked distention of the stomach, duodenum, and jejunum. Although a discrete transition zone cannot be identified, the distal and terminal ileum is completely decompressed. Imaging features are compatible with small bowel obstruction although etiology is not apparent on this exam. No small bowel wall  thickening or pneumatosis. No intraperitoneal free fluid. 2. The appendix is fluid-filled and distended up to 9 mm diameter but there is no periappendiceal edema or inflammation. 3. Hepatic and renal cysts. 4. Aortic Atherosclerosis (ICD10-I70.0).   Assessment/Plan: (ICD-10's: K31.609) 76 y.o. male with abdominal pain, nausea, and emesis found to have probable small bowel obstruction -vs- ileus in setting of UTI, complicated by pertinent comorbidities including MS.   - Appreciate medicine admission - We will plan to manage this conservatively as outlined below. He, and his wife, understand that should he fail to improve (or deteriorate), we may need to proceed with surgical intervention - Recommend placement of NGT for decompression; LIS; monitor and record output   - Recommend NPO and IVF resuscitation   - continue Abx for UTI  - Monitor abdominal examination; on-going bowel function - Serial KUB as needed - Pain control prn; antiemetics prn - Monitor renal function; AKI   - Further management per primary service; we will follow  All of the above findings and recommendations were discussed with the patient and his family (wife at bedside), and all of their questions were answered to their expressed satisfaction.  Thank you for the opportunity to participate in this patient's care.   -- Edison Simon, PA-C [facility] 04/10/2021, 3:13 PM [phone] M-F: 7am - 4pm

## 2021-04-10 NOTE — ED Provider Notes (Signed)
[facility] Provider Note    Event Date/Time   First MD Initiated Contact with Patient 04/10/21 1517     (approximate)   History   Abdominal Pain   HPI  Ronald Santiago is a 76 y.o. male with stated past medical history of primary progressive multiple sclerosis and history of cholecystectomy who presents for nausea/vomiting over the last 24 hours.  Patient describes a generalized 10/10, nonradiating abdominal pain that he describes as sharp/burning with associated nausea/vomiting of initially yellow and now dark vomitus.  Denies any blood in his vomit.  Patient denies any symptoms similar to this in the past.  Patient endorses chills without fever.  Patient denies any chest pain/shortness of breath.  Patient states that he mostly uses his left hand at baseline as his right arm and bilateral lower extremities are severely debilitated.     Physical Exam   Triage Vital Signs: ED Triage Vitals  Enc Vitals Group     BP 04/10/21 1150 116/68     Pulse Rate 04/10/21 1150 92     Resp 04/10/21 1150 16     Temp 04/10/21 1150 98.2 F (36.8 C)     Temp Source 04/10/21 1150 Oral     SpO2 04/10/21 1150 95 %     Weight 04/10/21 1150 198 lb 11.2 oz (90.1 kg)     Height 04/10/21 1150 6' (1.829 m)     Head Circumference --      Peak Flow --      Pain Score 04/10/21 1412 6     Pain Loc --      Pain Edu? --      Excl. in GC? --     Most recent vital signs: Vitals:   04/10/21 1334 04/10/21 1617  BP:  127/64  Pulse: 100 88  Resp: 14 20  Temp:    SpO2: 95% 92%    General: Awake, no distress.  CV:  Good peripheral perfusion.  Resp:  Normal effort.  Abd:  Mildly distended with generalized tenderness to palpation Other:  3/5 strength in bilateral lower extremities and right upper extremity   ED Results / Procedures / Treatments   Labs (all labs ordered are listed, but only abnormal results are displayed) Labs Reviewed  COMPREHENSIVE METABOLIC PANEL - Abnormal;  Notable for the following components:      Result Value   Chloride 94 (*)    Glucose, Bld 189 (*)    BUN 41 (*)    Creatinine, Ser 1.56 (*)    Total Protein 8.6 (*)    GFR, Estimated 47 (*)    Anion gap 16 (*)    All other components within normal limits  URINALYSIS, ROUTINE W REFLEX MICROSCOPIC - Abnormal; Notable for the following components:   Color, Urine YELLOW (*)    APPearance CLOUDY (*)    pH 9.0 (*)    Ketones, ur 20 (*)    Protein, ur >=300 (*)    Leukocytes,Ua LARGE (*)    Bacteria, UA MANY (*)    All other components within normal limits  RESP PANEL BY RT-PCR (FLU A&B, COVID) ARPGX2  LIPASE, BLOOD  CBC     EKG ED ECG REPORT I, Merwyn Katos, the attending physician, personally viewed and interpreted this ECG.  Date: 04/10/2021 EKG Time: 1153 Rate: 90 Rhythm: normal sinus rhythm QRS Axis: normal Intervals: normal ST/T Wave abnormalities: normal Narrative Interpretation: no evidence of acute ischemia   RADIOLOGY ED MD interpretation:  CT of the abdomen and pelvis with IV contrast shows marked distention of the stomach, duodenum, and jejunum without a discrete transition zone but compatible with a small bowel obstruction.  Agree with radiology assessment  Official radiology report(s): CT Abdomen Pelvis W Contrast  Result Date: 04/10/2021 CLINICAL DATA:  Abdominal pain.  Nausea and vomiting. EXAM: CT ABDOMEN AND PELVIS WITH CONTRAST TECHNIQUE: Multidetector CT imaging of the abdomen and pelvis was performed using the standard protocol following bolus administration of intravenous contrast. RADIATION DOSE REDUCTION: This exam was performed according to the departmental dose-optimization program which includes automated exposure control, adjustment of the mA and/or kV according to patient size and/or use of iterative reconstruction technique. CONTRAST:  OMNIPAQUE IOHEXOL 300 MG/ML  SOLN COMPARISON:  CT stone study 06/24/2020 FINDINGS: Lower chest: Dependent  atelectasis noted both lower lungs. Hepatobiliary: Similar appearance multiple hepatic cysts. Gallbladder surgically absent. No intrahepatic or extrahepatic biliary dilation. Pancreas: No focal mass lesion. No dilatation of the main duct. No intraparenchymal cyst. No peripancreatic edema. Spleen: Several hypoattenuating lesions in the spleen appear stable in the interval, likely cysts or pseudocyst. Adrenals/Urinary Tract: No adrenal nodule or mass. Small cyst noted left kidney. Additional tiny hypoattenuating lesions in both kidneys are too small to characterize but likely benign. No evidence for hydroureter. Suprapubic tube decompresses the urinary bladder. Air in the bladder lumen is compatible with the presence of the catheter. Stomach/Bowel: Stomach is markedly distended with gas and fluid. Duodenum is dilated. Proximal jejunal loops are dilated up to 4.6 cm diameter. Although a discrete transition zone cannot be identified, the distal and terminal ileum is completely decompressed. The appendix is fluid-filled and distended up to 9 mm diameter but there is no periappendiceal edema or inflammation. Air in stool is seen scattered along the length of a nondilated colon. Vascular/Lymphatic: There is mild atherosclerotic calcification of the abdominal aorta without aneurysm. There is no gastrohepatic or hepatoduodenal ligament lymphadenopathy. No retroperitoneal or mesenteric lymphadenopathy. Orientation of the SMV and SMA is similar to the prior study. No pelvic sidewall lymphadenopathy. Reproductive: The prostate gland and seminal vesicles are unremarkable. Other: No intraperitoneal free fluid. Musculoskeletal: No worrisome lytic or sclerotic osseous abnormality. IMPRESSION: 1. Marked distention of the stomach, duodenum, and jejunum. Although a discrete transition zone cannot be identified, the distal and terminal ileum is completely decompressed. Imaging features are compatible with small bowel obstruction  although etiology is not apparent on this exam. No small bowel wall thickening or pneumatosis. No intraperitoneal free fluid. 2. The appendix is fluid-filled and distended up to 9 mm diameter but there is no periappendiceal edema or inflammation. 3. Hepatic and renal cysts. 4. Aortic Atherosclerosis (ICD10-I70.0). Electronically Signed   By: Kennith Center M.D.   On: 04/10/2021 14:51   DG Chest Port 1 View  Result Date: 04/10/2021 CLINICAL DATA:  Shortness of breath EXAM: PORTABLE CHEST 1 VIEW COMPARISON:  Abdominal pain, nausea, vomiting, shortness of breath FINDINGS: The heart size and mediastinal contours are within normal limits. Mild, diffuse interstitial pulmonary opacity. Elevation of the left hemidiaphragm. The visualized skeletal structures are unremarkable. IMPRESSION: 1. Mild, diffuse interstitial pulmonary opacity, which may reflect edema or infection. No focal airspace opacity. 2.  Elevation of the left hemidiaphragm. Electronically Signed   By: Jearld Lesch M.D.   On: 04/10/2021 14:04   DG Abd Portable 1 View  Result Date: 04/10/2021 CLINICAL DATA:  NG tube placement. EXAM: PORTABLE ABDOMEN - 1 VIEW COMPARISON:  None. FINDINGS: Nasogastric tube with the tip  projecting over the fundus of the stomach. Gaseous distension of the small bowel and colon. No bowel dilatation to suggest obstruction. No evidence of pneumoperitoneum, portal venous gas or pneumatosis. No pathologic calcifications along the expected course of the ureters. No acute osseous abnormality. IMPRESSION: Nasogastric tube with the tip projecting over the fundus of the stomach. Electronically Signed   By: Elige Ko M.D.   On: 04/10/2021 16:11      PROCEDURES:  Critical Care performed: No  .1-3 Lead EKG Interpretation Performed by: Merwyn Katos, MD Authorized by: Merwyn Katos, MD     Interpretation: normal     ECG rate:  87   ECG rate assessment: normal     Rhythm: sinus rhythm     Ectopy: none      Conduction: normal     MEDICATIONS ORDERED IN ED: Medications  oxybutynin (DITROPAN) tablet 5 mg (has no administration in time range)  baclofen (LIORESAL) tablet 10 mg (has no administration in time range)  clonazePAM (KLONOPIN) tablet 1 mg (has no administration in time range)  gabapentin (NEURONTIN) tablet 600 mg (has no administration in time range)  enoxaparin (LOVENOX) injection 40 mg (has no administration in time range)  0.9 %  sodium chloride infusion (has no administration in time range)  acetaminophen (TYLENOL) tablet 650 mg (has no administration in time range)    Or  acetaminophen (TYLENOL) suppository 650 mg (has no administration in time range)  traZODone (DESYREL) tablet 25 mg (has no administration in time range)  magnesium hydroxide (MILK OF MAGNESIA) suspension 30 mL (has no administration in time range)  ondansetron (ZOFRAN) tablet 4 mg (has no administration in time range)    Or  ondansetron (ZOFRAN) injection 4 mg (has no administration in time range)  morphine 2 MG/ML injection 2 mg (has no administration in time range)  sodium chloride 0.9 % bolus 1,000 mL (1,000 mLs Intravenous New Bag/Given 04/10/21 1412)  metoCLOPramide (REGLAN) injection 10 mg (10 mg Intravenous Given 04/10/21 1413)  HYDROmorphone (DILAUDID) injection 1 mg (1 mg Intravenous Given 04/10/21 1415)  iohexol (OMNIPAQUE) 300 MG/ML solution 100 mL (100 mLs Intravenous Contrast Given 04/10/21 1427)     IMPRESSION / MDM / ASSESSMENT AND PLAN / ED COURSE  I reviewed the triage vital signs and the nursing notes.                              Differential diagnosis includes, but is not limited to, appendicitis, SBO, sepsis, gastroenteritis The patient is on the cardiac monitor to evaluate for evidence of arrhythmia and/or significant heart rate changes. Given History, Exam I believe patient needs labs and imaging to evaluate for SBO vs other acute abdomen. ED Workup: CBC, BMP, LFTs, CT Abdomen/Pelvis ED  Findings: CT: Small Bowel Obstruction CMP concerning for creatinine of 1.56 up from baseline 1.9, anion gap of 16 UA shows evidence of leukocytes and bacteria likely due to patient suprapubic catheter History, Exam, and Workup show no overt evidence of mesenteric ischemia, bowel gangrene, abscess, peritonitis. ED Interventions: Analgesia. Defer ABX at this time. Consult: General Surgery Disposition: Admit       FINAL CLINICAL IMPRESSION(S) / ED DIAGNOSES   Final diagnoses:  SBO (small bowel obstruction) (HCC)  Nausea and vomiting, unspecified vomiting type     Rx / DC Orders   ED Discharge Orders     None        Note:  This document  was prepared using Conservation officer, historic buildings and may include unintentional dictation errors.   Merwyn Katos, MD 04/10/21 [phone]

## 2021-04-10 NOTE — H&P (Addendum)
[name]   PATIENT NAME: Ronald Santiago    MR#:  [id number]  DATE OF BIRTH:  1945/04/12  DATE OF ADMISSION:  04/10/2021  PRIMARY CARE PHYSICIAN: Leonel Ramsay, MD   Patient is coming from: Home  REQUESTING/REFERRING PHYSICIAN: Valora Piccolo, MD  CHIEF COMPLAINT:   Chief Complaint  Patient presents with   Abdominal Pain    HISTORY OF PRESENT ILLNESS:  Ronald Santiago is a 76 y.o. 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  There was no bright red blood and he denied any melena or bright red bleeding per rectum.  He has been having dry cough over the last couple days without dyspnea or wheezing or hemoptysis.  No chest pain or palpitations.  He admitted to headache.  His last bowel movement was fairly small on Tuesday and since then he had no bowel movements.  He has a right ischial large decubitus ulcer that is cared for by home health nurse.  He admitted to fever of 102 on Tuesday and chills.  No fever today.  ED Course: When the patient came to the ER pulse oximetry was 95% on 2 L of O2 by nasal cannula and vital signs were otherwise normal.  Labs revealed chloride of 94 and glucose of 189, BUN of 41 creatinine 1.56 previously normal, anion gap of 16 to obtain of 8.6.  CBC was within normal.  UA was positive for UTI and showed proteinuria and ketonuria.  EKG as reviewed by me : EKG showed normal sinus rhythm with a rate of 90 with ventricular bigeminy left atrial enlargement. Imaging: Portable chest ray showed mild diffuse interstitial pulmonary opacity that may reflect edema or infection and left hemidiaphragm elevation.  Abdominal pelvic CT scan revealed: 1. Marked distention of the stomach, duodenum, and jejunum. Although a discrete transition zone cannot  be identified, the distal and terminal ileum is completely decompressed. Imaging features are compatible with small bowel obstruction although etiology is not apparent on this exam. No small bowel wall thickening or pneumatosis. No intraperitoneal free fluid. 2. The appendix is fluid-filled and distended up to 9 mm diameter but there is no periappendiceal edema or inflammation. 3. Hepatic and renal cysts. 4. Aortic Atherosclerosis.  The patient was given 1 mg of IV Dilaudid, 10 mg of IV Reglan and 1 L bolus of IV normal saline.  He will be admitted to a medical bed for further evaluation and management.  PAST MEDICAL HISTORY:   Past Medical History:  Diagnosis Date   Chronic kidney disease    Multiple sclerosis, primary progressive ([place])     PAST SURGICAL HISTORY:   Past Surgical History:  Procedure Laterality Date   BACK SURGERY     CHOLECYSTECTOMY     EYE SURGERY      SOCIAL HISTORY:   Social History   Tobacco Use   Smoking status: Some Days   Smokeless tobacco: Never  Substance Use Topics   Alcohol use: Not on file    FAMILY HISTORY:  No family history on file.  DRUG ALLERGIES:   Allergies  Allergen Reactions   Cephalexin Anaphylaxis and Hives    REVIEW OF SYSTEMS:   ROS As per history of present illness. All pertinent systems were reviewed above. Constitutional, HEENT, cardiovascular, respiratory, GI, GU, musculoskeletal,  neuro, psychiatric, endocrine, integumentary and hematologic systems were reviewed and are otherwise negative/unremarkable except for positive findings mentioned above in the HPI.   MEDICATIONS AT HOME:   Prior to Admission medications   Medication Sig Start Date End Date Taking? Authorizing Provider  amoxicillin-clavulanate (AUGMENTIN) 875-125 MG tablet Take 1 tablet by mouth 2 (two) times daily. 12/25/20   [provider]  baclofen (LIORESAL) 10 MG tablet Take 10 mg by mouth 4 (four) times daily. 05/21/20   [provider]  Cholecalciferol (D3-1000 PO) Take by mouth.    [provider]  clonazePAM (KLONOPIN) 1 MG tablet Take 1 mg by mouth at bedtime as needed. 07/02/20   [provider]  Docusate Sodium (DSS) 100 MG CAPS Take by mouth.    [provider]  gabapentin (NEURONTIN) 600 MG tablet Take by mouth. 02/14/19   [provider]  Magnesium 400 MG TABS Take by mouth.    [provider]  MOVANTIK 25 MG TABS tablet Take 25 mg by mouth daily. 05/21/20   [provider]  nystatin (MYCOSTATIN/NYSTOP) powder SMARTSIG:1 Application Topical 2-3 Times Daily 06/13/20   [provider]  oxybutynin (DITROPAN) 5 MG tablet Take 1 tablet (5 mg total) by mouth 3 (three) times daily. 02/03/21   Bjorn Loser, MD  Oxycodone HCl 10 MG TABS 10 mg. 04/11/19   [provider]  Probiotic Product (PROBIOTIC-10 PO) Take by mouth.    [provider]      VITAL SIGNS:  Blood pressure (!) 146/67, pulse 100, temperature 98.2 F (36.8 C), temperature source Oral, resp. rate 14, height 6' (1.829 m), weight 90.1 kg, SpO2 95 %.  PHYSICAL EXAMINATION:  Physical Exam  GENERAL:  76 y.o.-year-old Caucasian male patient lying in the bed with no acute distress.  EYES: Pupils equal, round, reactive to light and accommodation. No scleral icterus. Extraocular muscles intact.  HEENT: Head atraumatic, normocephalic. Oropharynx and nasopharynx clear.  NECK:  Supple, no jugular venous distention. No thyroid enlargement, no tenderness.  LUNGS: Normal breath sounds bilaterally, no wheezing, rales,rhonchi or crepitation. No use of accessory muscles of respiration.  CARDIOVASCULAR: Regular rate and rhythm, S1, S2 normal. No murmurs, rubs, or gallops.  ABDOMEN: Soft, nondistended, nontender. Bowel sounds present. No organomegaly or mass.  EXTREMITIES: No pedal edema, cyanosis, or clubbing.  NEUROLOGIC: Cranial nerves II through XII are intact. Muscle strength  5/5 in all extremities. Sensation intact. Gait not checked.  PSYCHIATRIC: The patient is alert and oriented x 3.  Normal affect and good eye contact. SKIN: Clean right ischial large decubitus ulcer shown below.  No other lesions.     LABORATORY PANEL:   CBC Recent Labs  Lab 04/10/21 1155  WBC 9.0  HGB 15.8  HCT 46.8  PLT 281   ------------------------------------------------------------------------------------------------------------------  Chemistries  Recent Labs  Lab 04/10/21 1155  NA 137  K 3.9  CL 94*  CO2 27  GLUCOSE 189*  BUN 41*  CREATININE 1.56*  CALCIUM 9.1  AST 27  ALT 13  ALKPHOS 49  BILITOT 0.8   ------------------------------------------------------------------------------------------------------------------  Cardiac Enzymes No results for input(s): TROPONINI in the last 168 hours. ------------------------------------------------------------------------------------------------------------------  RADIOLOGY:  CT Abdomen Pelvis W Contrast  Result Date: 04/10/2021 CLINICAL DATA:  Abdominal pain.  Nausea and vomiting. EXAM: CT ABDOMEN AND PELVIS WITH CONTRAST TECHNIQUE: Multidetector CT imaging of the abdomen and pelvis was performed using the standard protocol following bolus administration of intravenous contrast. RADIATION DOSE REDUCTION: This exam was performed according to  the departmental dose-optimization program which includes automated exposure control, adjustment of the mA and/or kV according to patient size and/or use of iterative reconstruction technique. CONTRAST:  152mL OMNIPAQUE IOHEXOL 300 MG/ML  SOLN COMPARISON:  CT stone study 06/24/2020 FINDINGS: Lower chest: Dependent atelectasis noted both lower lungs. Hepatobiliary: Similar appearance multiple hepatic cysts. Gallbladder surgically absent. No intrahepatic or extrahepatic biliary dilation. Pancreas: No focal mass lesion. No dilatation of the main duct. No intraparenchymal cyst. No  peripancreatic edema. Spleen: Several hypoattenuating lesions in the spleen appear stable in the interval, likely cysts or pseudocyst. Adrenals/Urinary Tract: No adrenal nodule or mass. Small cyst noted left kidney. Additional tiny hypoattenuating lesions in both kidneys are too small to characterize but likely benign. No evidence for hydroureter. Suprapubic tube decompresses the urinary bladder. Air in the bladder lumen is compatible with the presence of the catheter. Stomach/Bowel: Stomach is markedly distended with gas and fluid. Duodenum is dilated. Proximal jejunal loops are dilated up to 4.6 cm diameter. Although a discrete transition zone cannot be identified, the distal and terminal ileum is completely decompressed. The appendix is fluid-filled and distended up to 9 mm diameter but there is no periappendiceal edema or inflammation. Air in stool is seen scattered along the length of a nondilated colon. Vascular/Lymphatic: There is mild atherosclerotic calcification of the abdominal aorta without aneurysm. There is no gastrohepatic or hepatoduodenal ligament lymphadenopathy. No retroperitoneal or mesenteric lymphadenopathy. Orientation of the SMV and SMA is similar to the prior study. No pelvic sidewall lymphadenopathy. Reproductive: The prostate gland and seminal vesicles are unremarkable. Other: No intraperitoneal free fluid. Musculoskeletal: No worrisome lytic or sclerotic osseous abnormality. IMPRESSION: 1. Marked distention of the stomach, duodenum, and jejunum. Although a discrete transition zone cannot be identified, the distal and terminal ileum is completely decompressed. Imaging features are compatible with small bowel obstruction although etiology is not apparent on this exam. No small bowel wall thickening or pneumatosis. No intraperitoneal free fluid. 2. The appendix is fluid-filled and distended up to 9 mm diameter but there is no periappendiceal edema or inflammation. 3. Hepatic and renal cysts.  4. Aortic Atherosclerosis (ICD10-I70.0). Electronically Signed   By: Misty Stanley M.D.   On: 04/10/2021 14:51   DG Chest Port 1 View  Result Date: 04/10/2021 CLINICAL DATA:  Shortness of breath EXAM: PORTABLE CHEST 1 VIEW COMPARISON:  Abdominal pain, nausea, vomiting, shortness of breath FINDINGS: The heart size and mediastinal contours are within normal limits. Mild, diffuse interstitial pulmonary opacity. Elevation of the left hemidiaphragm. The visualized skeletal structures are unremarkable. IMPRESSION: 1. Mild, diffuse interstitial pulmonary opacity, which may reflect edema or infection. No focal airspace opacity. 2.  Elevation of the left hemidiaphragm. Electronically Signed   By: Delanna Ahmadi M.D.   On: 04/10/2021 14:04      IMPRESSION AND PLAN:  Principal Problem:   SBO (small bowel obstruction) ([place])  1.  Small bowel obstruction. - The patient will be admitted to a medical bed. - We will continue hydration with IV normal saline. - We will optimize electrolytes. -Pain management will be provided. - We will follow two-view abdomen x-ray in AM. - General surgery consultation will be obtained. - Dr. Hampton Abbot was notified about the patient.  2.  Acute kidney injury likely prerenal due to nausea and vomiting. - The patient better with IV normal saline and will follow his BMP. -We will avoid nephrotoxins  3.  Primary progressive multiple sclerosis. - We will continue his baclofen, Ditropan, Movantik as well as Klonopin.  4.  Stage IV ischial decubitus ulcer. - His ulcer does not look infected. - We will continue same home care. - Wound care consult will be obtained.  DVT prophylaxis: Lovenox. Code Status: full code. Family Communication:  The plan of care was discussed in details with the patient (and family). I answered all questions. The patient agreed to proceed with the above mentioned plan. Further management will depend upon hospital course. Disposition Plan: Back to  previous home environment Consults called: General surgery. All the records are reviewed and case discussed with ED provider.  Status is: Inpatient   At the time of the admission, it appears that the appropriate admission status for this patient is inpatient.  This is judged to be reasonable and necessary in order to provide the required intensity of service to ensure the patient's safety given the presenting symptoms, physical exam findings and initial radiographic and laboratory data in the context of comorbid conditions.  The patient requires inpatient status due to high intensity of service, high risk of further deterioration and high frequency of surveillance required.  I certify that at the time of admission, it is my clinical judgment that the patient will require inpatient hospital care extending more than 2 midnights.                            Dispo: The patient is from: Home              Anticipated d/c is to: Home              Patient currently is not medically stable to d/c.              Difficult to place patient: No  Christel Mormon M.D on 04/10/2021 at 3:31 PM  [facility]   From 7 PM-7 AM, contact night-coverage [email or web address]  CC: Primary care physician; Leonel Ramsay, MD

## 2021-04-10 NOTE — ED Notes (Signed)
Pt transported to CT ?

## 2021-04-10 NOTE — ED Notes (Signed)
Pt's wife reported CT tech ran over the Pt's catheter drainage bag and it started leaking.  This writer placed a new standard drainage bag.

## 2021-04-11 ENCOUNTER — Inpatient Hospital Stay: Payer: Medicare Other

## 2021-04-11 LAB — BASIC METABOLIC PANEL
Anion gap: 9 (ref 5–15)
BUN: 44 mg/dL — ABNORMAL HIGH (ref 8–23)
CO2: 32 mmol/L (ref 22–32)
Calcium: 8.3 mg/dL — ABNORMAL LOW (ref 8.9–10.3)
Chloride: 101 mmol/L (ref 98–111)
Creatinine, Ser: 1.05 mg/dL (ref 0.61–1.24)
GFR, Estimated: >60 mL/min (ref >60)
Glucose, Bld: 145 mg/dL — ABNORMAL HIGH (ref 70–99)
Potassium: 3.2 mmol/L — ABNORMAL LOW (ref 3.5–5.1)
Sodium: 142 mmol/L (ref 135–145)

## 2021-04-11 LAB — PHOSPHORUS: Phosphorus: 3 mg/dL (ref 2.5–4.6)

## 2021-04-11 LAB — CBC
HCT: 44.6 % (ref 39.0–52.0)
Hemoglobin: 14.9 g/dL (ref 13.0–17.0)
MCH: 30.9 pg (ref 26.0–34.0)
MCHC: 33.4 g/dL (ref 30.0–36.0)
MCV: 92.5 fL (ref 80.0–100.0)
Platelets: 285 10*3/uL (ref 150–400)
RBC: 4.82 MIL/uL (ref 4.22–5.81)
RDW: 13.8 % (ref 11.5–15.5)
WBC: 9.3 10*3/uL (ref 4.0–10.5)
nRBC: 0 % (ref 0.0–0.2)

## 2021-04-11 LAB — MAGNESIUM: Magnesium: 2.8 mg/dL — ABNORMAL HIGH (ref 1.7–2.4)

## 2021-04-11 MED ORDER — PANTOPRAZOLE SODIUM 40 MG IV SOLR
40.0000 mg | Freq: Two times a day (BID) | INTRAVENOUS | Status: DC
Start: 2021-04-11 — End: 2021-04-11

## 2021-04-11 MED ORDER — POTASSIUM CHLORIDE 10 MEQ/100ML IV SOLN
10.0000 meq | INTRAVENOUS | Status: AC
  Administered 2021-04-11 (×4): 10 meq via INTRAVENOUS
  Filled 2021-04-11 (×4): qty 100

## 2021-04-11 MED ORDER — CIPROFLOXACIN IN D5W 400 MG/200ML IV SOLN
400.0000 mg | Freq: Two times a day (BID) | INTRAVENOUS | Status: DC
  Administered 2021-04-11 – 2021-04-13 (×5): 400 mg via INTRAVENOUS
  Filled 2021-04-11 (×5): qty 200

## 2021-04-11 MED ORDER — PANTOPRAZOLE SODIUM 40 MG IV SOLR
40.0000 mg | Freq: Two times a day (BID) | INTRAVENOUS | Status: DC
  Administered 2021-04-11 – 2021-04-19 (×16): 40 mg via INTRAVENOUS
  Filled 2021-04-11 (×16): qty 40

## 2021-04-11 MED ORDER — SODIUM CHLORIDE 0.9 % IV SOLN: 1.0000 g | INTRAVENOUS | Status: DC

## 2021-04-11 NOTE — TOC Progression Note (Signed)
Transition of Care [facility]) - Progression Note    Patient Details  Name: Ronald Santiago MRN: [id number] Date of Birth: 22-Apr-1945  [facility] of [facility]) CM/SW Contact  Eileen Stanford, LCSW Phone Number: 04/11/2021, 3:36 PM  Clinical Narrative: Pt states he doesn't feel like he could talk right now. CSW to check back with pt tomorrow. Per [place] pt is active with [employer] [employer]).           Expected Discharge Plan and Services                                                 Social Determinants of Health (SDOH) Interventions    Readmission Risk Interventions No flowsheet data found.

## 2021-04-11 NOTE — Progress Notes (Signed)
[facility] Progress Note  Patient: Ronald Santiago    [id number]  DOA: 04/10/2021     Date of Service: the patient was seen and examined on 04/11/2021  Chief Complaint  Patient presents with   Abdominal Pain   Brief hospital course: Ronald Santiago is a 76 y.o. 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  He has a right ischial large decubitus ulcer that is cared for by home health nurse.  He admitted to fever of 102 on Tuesday and chills.  No fever on the day of admission.  ED Course: When the patient came to the ER pulse oximetry was 95% on 2 L of O2 by nasal cannula and vital signs were otherwise normal.  Labs revealed chloride of 94 and glucose of 189, BUN of 41 creatinine 1.56 previously normal, anion gap of 16 to obtain of 8.6.  CBC was within normal.  UA was positive for UTI and showed proteinuria and ketonuria.   EKG as reviewed by me : EKG showed normal sinus rhythm with a rate of 90 with ventricular bigeminy left atrial enlargement. Imaging: Portable chest ray showed mild diffuse interstitial pulmonary opacity that may reflect edema or infection and left hemidiaphragm elevation.  Abdominal pelvic CT scan revealed: 1. Marked distention of the stomach, duodenum, and jejunum. Although a discrete transition zone cannot be identified, the distal and terminal ileum is completely decompressed. Imaging features are compatible with small bowel obstruction although etiology is not apparent on this exam. No small bowel wall thickening or pneumatosis. No intraperitoneal free fluid. 2. The appendix is fluid-filled and distended up to 9 mm diameter but there is no periappendiceal edema or inflammation. 3. Hepatic and renal cysts. 4. Aortic Atherosclerosis.   The patient was  given 1 mg of IV Dilaudid, 10 mg of IV Reglan and 1 L bolus of IV normal saline.  He will be admitted to a medical bed for further evaluation and management.  Assessment and Plan: Principal Problem:   SBO (small bowel obstruction) ([facility])   Small bowel obstruction. continue hydration with IV normal saline. optimize electrolytes. Pain management will be provided. abdomen x-ray did not show any difference in the SBO General surgery consult appreciated   Hypokalemia, potassium repleted.  UTI, started ciprofloxacin Follow urine culture  Acute kidney injury likely prerenal due to nausea and vomiting. - The patient better with IV normal saline and will follow his BMP. -We will avoid nephrotoxins  Primary progressive multiple sclerosis. - We will continue his baclofen, Ditropan, Movantik as well as Klonopin.  Stage IV ischial decubitus ulcer. - His ulcer does not look infected. - We will continue same home care. - Wound care consult will be obtained.   Body mass index is 26.95 kg/m.  Interventions:       Diet: NPO DVT Prophylaxis: Subcutaneous Lovenox   Advance goals of care discussion: Full code  Family Communication: family was present at bedside, at the time of interview.  The pt provided permission to discuss medical plan with the family. Opportunity was given to ask question and all questions were answered satisfactorily.   Disposition:  Pt is from Home, admitted with SBO, still has SBO, NG tube with LIS and NPO, IVF, which precludes a safe discharge. Discharge to Home, when clinically stable, may require 3 to 5  days.  Subjective: No significant events overnight, patient was admitted with SBO, feels improvement in the abdominal distention, NG tube intact with LIS. Patient denied any chest pain or palpitation, no shortness of breath.  Physical Exam: General:  alert oriented to time, place, and person.  Appear in mild distress, affect appropriate Eyes: PERRLA ENT:  Oral Mucosa Clear, dry  Neck: no JVD,  Cardiovascular: S1 and S2 Present, no Murmur,  Respiratory: good respiratory effort, Bilateral Air entry equal and Decreased, no Crackles, no wheezes Abdomen: Bowel Sound sluggish, firm, slightly distended, mild generalized tenderness Skin: No rashes Extremities: no Pedal edema, no calf tenderness Neurologic: without any new focal findings Gait not checked due to patient safety concerns  Vitals:   04/10/21 1334 04/10/21 1617 04/10/21 2043 04/11/21 0745  BP:  127/64 133/83 140/78  Pulse: 100 88 85 86  Resp: 14 20 20 18   Temp:   98.3 F (36.8 C) 98.6 F (37 C)  TempSrc:   Oral Oral  SpO2: 95% 92% 92% 92%  Weight:      Height:        Intake/Output Summary (Last 24 hours) at 04/11/2021 1522 Last data filed at 04/11/2021 1000 Gross per 24 hour  Intake 1000 ml  Output 2300 ml  Net -1300 ml   Filed Weights   04/10/21 1150  Weight: 90.1 kg    Data Reviewed: I have personally reviewed and interpreted daily labs, tele strips, imagings as discussed above. I reviewed all nursing notes, pharmacy notes, vitals, pertinent old records I have discussed plan of care as described above with RN and patient/family.  CBC: Recent Labs  Lab 04/10/21 1155 04/11/21 0534  WBC 9.0 9.3  HGB 15.8 14.9  HCT 46.8 44.6  MCV 92.1 92.5  PLT 281 [id number]   Basic Metabolic Panel: Recent Labs  Lab 04/10/21 1155 04/11/21 0534  NA 137 142  K 3.9 3.2*  CL 94* 101  CO2 27 32  GLUCOSE 189* 145*  BUN 41* 44*  CREATININE 1.56* 1.05  CALCIUM 9.1 8.3*  MG  --  2.8*  PHOS  --  3.0    Studies: DG Abd 1 View  Result Date: 04/11/2021 CLINICAL DATA:  Small bowel obstruction. EXAM: ABDOMEN - 1 VIEW COMPARISON:  04/10/2021 FINDINGS: The enteric tube tip and side port are below the level of the GE junction within the gastric fundus. Cholecystectomy clips. Suprapubic bladder catheter identified. No change in diffuse small bowel distension compatible with obstruction.  IMPRESSION: No change in small bowel obstruction pattern. Electronically Signed   By: Kerby Moors M.D.   On: 04/11/2021 07:19   DG Abd Portable 1 View  Result Date: 04/10/2021 CLINICAL DATA:  NG tube placement. EXAM: PORTABLE ABDOMEN - 1 VIEW COMPARISON:  None. FINDINGS: Nasogastric tube with the tip projecting over the fundus of the stomach. Gaseous distension of the small bowel and colon. No bowel dilatation to suggest obstruction. No evidence of pneumoperitoneum, portal venous gas or pneumatosis. No pathologic calcifications along the expected course of the ureters. No acute osseous abnormality. IMPRESSION: Nasogastric tube with the tip projecting over the fundus of the stomach. Electronically Signed   By: Kathreen Devoid M.D.   On: 04/10/2021 16:11    Scheduled Meds:  baclofen  20 mg Oral BID   baclofen  40 mg Oral QHS   docusate sodium  300 mg Oral QHS   enoxaparin (LOVENOX) injection  40 mg Subcutaneous Q24H   gabapentin  1,200 mg Oral TID  nystatin   Topical BID   oxybutynin  10 mg Oral TID   Continuous Infusions:  sodium chloride 100 mL/hr at 04/11/21 0754   ciprofloxacin 400 mg (04/11/21 1211)   potassium chloride 10 mEq (04/11/21 1444)   PRN Meds: acetaminophen **OR** acetaminophen, clonazePAM, magnesium hydroxide, morphine injection, ondansetron **OR** ondansetron (ZOFRAN) IV, traZODone  Time spent: 35 minutes  Author: Val Riles. MD [facility] 04/11/2021 3:22 PM  To reach On-call, see care teams to locate the attending and reach out to them via [email or web address]. If 7PM-7AM, please contact night-coverage If you still have difficulty reaching the attending provider, please page the [facility] (Director on Call) for [facility] on amion for assistance.

## 2021-04-11 NOTE — Progress Notes (Signed)
[facility] SURGICAL PROGRESS NOTE (cpt [phone])  Hospital Day(s): 1.   Interval History: Patient seen and examined, no acute events or new complaints overnight. Patient reports he is feeling somewhat better this morning but had a tough time with his XR this morning. Still with abdominal discomfort; worse at umbilicus, but abdomen is softer. He denied any fever, chills, nausea, or emesis. His labs are reassuring aside from mild hypokalemia to 3.2. NGT with 1000 ccs out. KUB remains unchanged. He is NPO.   Review of Systems:  Constitutional: denies fever, chills  HEENT: denies cough or congestion  Respiratory: denies any shortness of breath  Cardiovascular: denies chest pain or palpitations  Gastrointestinal: + abdominal pain, denied N/V Genitourinary: denies burning with urination or urinary frequency Musculoskeletal: denies pain, decreased motor or sensation  Vital signs in last 24 hours: [min-max] current  Temp:  [98.2 F (36.8 C)-98.3 F (36.8 C)] 98.3 F (36.8 C) (01/19 2043) Pulse Rate:  [29-100] 85 (01/19 2043) Resp:  [11-20] 20 (01/19 2043) BP: (116-146)/(64-88) 133/83 (01/19 2043) SpO2:  [92 %-96 %] 92 % (01/19 2043) Weight:  [90.1 kg] 90.1 kg (01/19 1150)     Height: 6' (182.9 cm) Weight: 90.1 kg BMI (Calculated): 26.94   Intake/Output last 2 shifts:  01/19 0701 - 01/20 0700 In: 1000 [IV Piggyback:1000] Out: 1000 [Emesis/NG output:1000]   Physical Exam:  Constitutional: alert, cooperative and no distress  HENT: normocephalic without obvious abnormality  Eyes: PERRL, EOM's grossly intact and symmetric  Respiratory: Breathing non-labored at rest  Cardiovascular: Regular rate and sinus rhythm  Gastrointestinal: Soft, soreness worse at umbilicus, distension improved, no rebound/guarding Genitourinary: Suprapubic foley in place  Labs:  CBC Latest Ref Rng & Units 04/11/2021 04/10/2021 06/24/2020  WBC 4.0 - 10.5 K/uL 9.3 9.0 8.0  Hemoglobin 13.0 - 17.0 g/dL  14.9 15.8 15.8  Hematocrit 39.0 - 52.0 % 44.6 46.8 45.5  Platelets 150 - 400 K/uL 285 281 244   CMP Latest Ref Rng & Units 04/11/2021 04/10/2021 06/24/2020  Glucose 70 - 99 mg/dL 145(H) 189(H) 94  BUN 8 - 23 mg/dL 44(H) 41(H) 12  Creatinine 0.61 - 1.24 mg/dL 1.05 1.56(H) 0.90  Sodium 135 - 145 mmol/L 142 137 137  Potassium 3.5 - 5.1 mmol/L 3.2(L) 3.9 4.4  Chloride 98 - 111 mmol/L 101 94(L) 101  CO2 22 - 32 mmol/L 32 27 27  Calcium 8.9 - 10.3 mg/dL 8.3(L) 9.1 9.1  Total Protein 6.5 - 8.1 g/dL - 8.6(H) 7.7  Total Bilirubin 0.3 - 1.2 mg/dL - 0.8 0.7  Alkaline Phos 38 - 126 U/L - 49 39  AST 15 - 41 U/L - 27 23  ALT 0 - 44 U/L - 13 16    Imaging studies:   KUB (04/11/2021) personally reviewed with persistent appearance to small bowel, and radiologist report reviewed:  IMPRESSION: No change in small bowel obstruction pattern.   Assessment/Plan: (ICD-10's: K53.609) 76 y.o. male with probable small bowel obstruction -vs- ileus in setting of UTI -vs- chronic constipation -vs- complication of MS  - We will continue conservative management as outlined below. He, and his wife, understand that should he fail to improve (or deteriorate), we may need to proceed with surgical intervention - Continue NGT for decompression; LIS; monitor and record output              - Recommend NPO and IVF resuscitation              - continue Abx for UTI             -  Monitor abdominal examination; on-going bowel function - Serial KUB as needed; may need gastrografin over the weekend if fail to improve - Pain control prn; antiemetics prn - Monitor renal function; AKI improved             - Further management per primary service; we will follow  All of the above findings and recommendations were discussed with the patient, patient's family (wife), and the medical team, and all of patient's and family's questions were answered to their expressed satisfaction.  -- Edison Simon, PA-C [facility] 04/11/2021, 7:33 AM [id number] M-F: 7am - 4pm

## 2021-04-11 NOTE — Consult Note (Signed)
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 Have used silver hydrofiber and [place]uka honey in the past, were in the process of changing to hydrofera blue when patient was admitted.  I explain that we use only the silver hydrofiber in house and they are fine with this POC at this time. We will change dressing daily and keep HOB at or a 30 degree angle to facilitate healing as tolerated. We explore the previus pressure injury that has healed on the sacrum and I will order the use of a prophylactic foam dressing in that location to protect the skin.  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  Wife is using Desitin cream in the inguinal area for intertriginous dermatitis. This is typically effective for him. She will reach out to the MD hospitalist should she feel it is not responding.  [facility] nursing team will not follow, but will remain available to this patient, the nursing and medical teams.  Please re-consult if needed. Thanks, Maudie Flakes, MSN, RN, [place], Arther Abbott  Pager# [phone]

## 2021-04-12 ENCOUNTER — Inpatient Hospital Stay: Payer: Medicare Other

## 2021-04-12 LAB — PHOSPHORUS: Phosphorus: 2.4 mg/dL — ABNORMAL LOW (ref 2.5–4.6)

## 2021-04-12 LAB — BASIC METABOLIC PANEL
Anion gap: 13 (ref 5–15)
BUN: 32 mg/dL — ABNORMAL HIGH (ref 8–23)
CO2: 29 mmol/L (ref 22–32)
Calcium: 8.2 mg/dL — ABNORMAL LOW (ref 8.9–10.3)
Chloride: 100 mmol/L (ref 98–111)
Creatinine, Ser: 0.82 mg/dL (ref 0.61–1.24)
GFR, Estimated: >60 mL/min (ref >60)
Glucose, Bld: 128 mg/dL — ABNORMAL HIGH (ref 70–99)
Potassium: 3.2 mmol/L — ABNORMAL LOW (ref 3.5–5.1)
Sodium: 142 mmol/L (ref 135–145)

## 2021-04-12 LAB — CBC
HCT: 43.9 % (ref 39.0–52.0)
Hemoglobin: 14.5 g/dL (ref 13.0–17.0)
MCH: 31.5 pg (ref 26.0–34.0)
MCHC: 33 g/dL (ref 30.0–36.0)
MCV: 95.2 fL (ref 80.0–100.0)
Platelets: 280 10*3/uL (ref 150–400)
RBC: 4.61 MIL/uL (ref 4.22–5.81)
RDW: 13.7 % (ref 11.5–15.5)
WBC: 7.2 10*3/uL (ref 4.0–10.5)
nRBC: 0 % (ref 0.0–0.2)

## 2021-04-12 LAB — MAGNESIUM: Magnesium: 3 mg/dL — ABNORMAL HIGH (ref 1.7–2.4)

## 2021-04-12 MED ORDER — GUAIFENESIN-DM 100-10 MG/5ML PO SYRP
10.0000 mL | ORAL_SOLUTION | Freq: Four times a day (QID) | ORAL | Status: DC | PRN
  Administered 2021-04-12 – 2021-04-17 (×3): 10 mL via ORAL
  Filled 2021-04-12 (×3): qty 10

## 2021-04-12 MED ORDER — POTASSIUM CHLORIDE 10 MEQ/100ML IV SOLN
10.0000 meq | INTRAVENOUS | Status: AC
  Administered 2021-04-12 (×2): 10 meq via INTRAVENOUS
  Filled 2021-04-12: qty 100

## 2021-04-12 MED ORDER — ALBUMIN HUMAN 25 % IV SOLN
25.0000 g | Freq: Once | INTRAVENOUS | Status: AC
  Administered 2021-04-12: 25 g via INTRAVENOUS
  Filled 2021-04-12: qty 100

## 2021-04-12 MED ORDER — LORATADINE 10 MG PO TABS
10.0000 mg | ORAL_TABLET | Freq: Every day | ORAL | Status: DC
  Administered 2021-04-12 – 2021-04-22 (×11): 10 mg via ORAL
  Filled 2021-04-12 (×11): qty 1

## 2021-04-12 MED ORDER — POTASSIUM PHOSPHATES 15 MMOLE/5ML IV SOLN
30.0000 mmol | Freq: Once | INTRAVENOUS | Status: AC
  Administered 2021-04-12: 30 mmol via INTRAVENOUS
  Filled 2021-04-12: qty 10

## 2021-04-12 NOTE — Progress Notes (Signed)
CC: SBO Subjective: Feels a bit better NGT 600cc Kub w persistent bowel dilation, no free air  Objective: Vital signs in last 24 hours: Temp:  [98.2 F (36.8 C)-99.1 F (37.3 C)] 98.2 F (36.8 C) (01/21 0829) Pulse Rate:  [51-92] 79 (01/21 0829) Resp:  [16-20] 18 (01/21 0829) BP: (131-142)/(64-80) 131/69 (01/21 0829) SpO2:  [93 %-95 %] 94 % (01/21 0829) Last BM Date: 04/10/21  Intake/Output from previous day: 01/20 0701 - 01/21 0700 In: 2000 [I.V.:800; NG/GT:700; IV Piggyback:500] Out: 2100 [Urine:1500; Emesis/NG output:600] Intake/Output this shift: No intake/output data recorded.  Physical exam:  NAD alert, chronically ill Abd: soft, decrease bs mildly distended. Non tender w/o peritonitis  Lab Results: CBC  Recent Labs    04/11/21 0534 04/12/21 0605  WBC 9.3 7.2  HGB 14.9 14.5  HCT 44.6 43.9  PLT 285 280   BMET Recent Labs    04/11/21 0534 04/12/21 0605  NA 142 142  K 3.2* 3.2*  CL 101 100  CO2 32 29  GLUCOSE 145* 128*  BUN 44* 32*  CREATININE 1.05 0.82  CALCIUM 8.3* 8.2*   PT/INR No results for input(s): LABPROT, INR in the last 72 hours. ABG No results for input(s): PHART, HCO3 in the last 72 hours.  Invalid input(s): PCO2, PO2  Studies/Results: DG Abd 1 View  Result Date: 04/12/2021 CLINICAL DATA:  Small-bowel obstruction re-evaluation. EXAM: ABDOMEN - 1 VIEW COMPARISON:  Abdominal radiograph 04/19/2021. FINDINGS: Redemonstrated multiple gaseous distended loops of small bowel within the central abdomen, not significantly changed from prior exam. Enteric tube visualized within the upper abdomen. Cholecystectomy clips. Lumbar spine degenerative changes. IMPRESSION: Similar-appearing small-bowel obstruction. Electronically Signed   By: Lovey Newcomer M.D.   On: 04/12/2021 09:23   DG Abd 1 View  Result Date: 04/11/2021 CLINICAL DATA:  Small bowel obstruction. EXAM: ABDOMEN - 1 VIEW COMPARISON:  04/10/2021 FINDINGS: The enteric tube tip and side port  are below the level of the GE junction within the gastric fundus. Cholecystectomy clips. Suprapubic bladder catheter identified. No change in diffuse small bowel distension compatible with obstruction. IMPRESSION: No change in small bowel obstruction pattern. Electronically Signed   By: Kerby Moors M.D.   On: 04/11/2021 07:19   DG Abd Portable 1 View  Result Date: 04/10/2021 CLINICAL DATA:  NG tube placement. EXAM: PORTABLE ABDOMEN - 1 VIEW COMPARISON:  None. FINDINGS: Nasogastric tube with the tip projecting over the fundus of the stomach. Gaseous distension of the small bowel and colon. No bowel dilatation to suggest obstruction. No evidence of pneumoperitoneum, portal venous gas or pneumatosis. No pathologic calcifications along the expected course of the ureters. No acute osseous abnormality. IMPRESSION: Nasogastric tube with the tip projecting over the fundus of the stomach. Electronically Signed   By: Kathreen Devoid M.D.   On: 04/10/2021 16:11    Anti-infectives: Anti-infectives (From admission, onward)    Start     Dose/Rate Route Frequency Ordered Stop   04/11/21 1200  ciprofloxacin (CIPRO) IVPB 400 mg        400 mg 200 mL/hr over 60 Minutes Intravenous Every 12 hours 04/11/21 1102     04/11/21 1115  cefTRIAXone (ROCEPHIN) 1 g in sodium chloride 0.9 % 100 mL IVPB  Status:  Discontinued        1 g 200 mL/hr over 30 Minutes Intravenous Every 24 hours 04/11/21 1027 04/11/21 1101       Assessment/Plan:  SBO continue NGT May need gastrografin Not peritonitic nor septic No need  for emergent surgical intervention We will follow  Caroleen Hamman, MD, [facility]  04/12/2021

## 2021-04-12 NOTE — Plan of Care (Signed)
  Problem: Clinical Measurements: Goal: Ability to maintain clinical measurements within normal limits will improve Outcome: Progressing   

## 2021-04-12 NOTE — Progress Notes (Signed)
[facility] Progress Note  Patient: Ronald Santiago    [id number]  DOA: 04/10/2021     Date of Service: the patient was seen and examined on 04/12/2021  Chief Complaint  Patient presents with   Abdominal Pain   Brief hospital course: Clee Comar is a 76 y.o. 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  He has a right ischial large decubitus ulcer that is cared for by home health nurse.  He admitted to fever of 102 on Tuesday and chills.  No fever on the day of admission.  ED Course: When the patient came to the ER pulse oximetry was 95% on 2 L of O2 by nasal cannula and vital signs were otherwise normal.  Labs revealed chloride of 94 and glucose of 189, BUN of 41 creatinine 1.56 previously normal, anion gap of 16 to obtain of 8.6.  CBC was within normal.  UA was positive for UTI and showed proteinuria and ketonuria.   EKG as reviewed by me : EKG showed normal sinus rhythm with a rate of 90 with ventricular bigeminy left atrial enlargement. Imaging: Portable chest ray showed mild diffuse interstitial pulmonary opacity that may reflect edema or infection and left hemidiaphragm elevation.  Abdominal pelvic CT scan revealed: 1. Marked distention of the stomach, duodenum, and jejunum. Although a discrete transition zone cannot be identified, the distal and terminal ileum is completely decompressed. Imaging features are compatible with small bowel obstruction although etiology is not apparent on this exam. No small bowel wall thickening or pneumatosis. No intraperitoneal free fluid. 2. The appendix is fluid-filled and distended up to 9 mm diameter but there is no periappendiceal edema or inflammation. 3. Hepatic and renal cysts. 4. Aortic Atherosclerosis.   The patient was  given 1 mg of IV Dilaudid, 10 mg of IV Reglan and 1 L bolus of IV normal saline.  He will be admitted to a medical bed for further evaluation and management.  Assessment and Plan: Principal Problem:   SBO (small bowel obstruction) (HCC)   Small bowel obstruction. continue hydration with IV normal saline. optimize electrolytes. Pain management will be provided. abdomen x-ray did not show any difference in the SBO General surgery consult appreciated Patient may need Gastrografin, no emergent surgical intervention at this time.  Hypokalemia, potassium repleted.  UTI, started ciprofloxacin Follow urine culture growing Proteus vulgaris, susceptibility pending  Acute kidney injury likely prerenal due to nausea and vomiting. - The patient better with IV normal saline and will follow his BMP. -We will avoid nephrotoxins  Primary progressive multiple sclerosis. - We will continue his baclofen, Ditropan, Movantik as well as Klonopin.  Stage IV ischial decubitus ulcer. - His ulcer does not look infected. - We will continue same home care. - Wound care consult will be obtained. Frequent turning every 2 hourly  Body mass index is 26.95 kg/m.  Interventions:       Diet: NPO DVT Prophylaxis: Subcutaneous Lovenox   Advance goals of care discussion: Full code  Family Communication: family was present at bedside, at the time of interview.  The pt provided permission to discuss medical plan with the family. Opportunity was given to ask question and all questions were answered satisfactorily.   Disposition:  Pt is from Home, admitted with SBO, still has SBO, NG tube with LIS  and NPO, IVF, which precludes a safe discharge. Discharge to Home, when clinically stable, may require 3 to 5 days.  Subjective: No significant events overnight, patient still has off-and-on abdominal pain,  NG tube intact with intermittent suction, patient is not passing gas and no BM yet. Patient denies any  chest pain or palpitations, no shortness of breath.   Physical Exam: General:  alert oriented to time, place, and person.  Appear in mild distress, affect appropriate Eyes: PERRLA ENT: Oral Mucosa Clear, dry  Neck: no JVD,  Cardiovascular: S1 and S2 Present, no Murmur,  Respiratory: good respiratory effort, Bilateral Air entry equal and Decreased, no Crackles, no wheezes Abdomen: Bowel Sound sluggish, firm, slightly distended, mild generalized tenderness Skin: No rashes Extremities: no Pedal edema, no calf tenderness Neurologic: without any new focal findings Gait not checked due to patient safety concerns  Vitals:   04/11/21 1537 04/11/21 2025 04/12/21 0458 04/12/21 0829  BP: (!) 142/80 137/64 132/78 131/69  Pulse: 92 (!) 51 75 79  Resp: 18 16 20 18   Temp: 99.1 F (37.3 C) 98.5 F (36.9 C) 98.9 F (37.2 C) 98.2 F (36.8 C)  TempSrc:    Oral  SpO2: 95% 95% 93% 94%  Weight:      Height:        Intake/Output Summary (Last 24 hours) at 04/12/2021 1533 Last data filed at 04/12/2021 [id number] Gross per 24 hour  Intake 2000.03 ml  Output 800 ml  Net 1200.03 ml   Filed Weights   04/10/21 1150  Weight: 90.1 kg    Data Reviewed: I have personally reviewed and interpreted daily labs, tele strips, imagings as discussed above. I reviewed all nursing notes, pharmacy notes, vitals, pertinent old records I have discussed plan of care as described above with RN and patient/family.  CBC: Recent Labs  Lab 04/10/21 1155 04/11/21 0534 04/12/21 0605  WBC 9.0 9.3 7.2  HGB 15.8 14.9 14.5  HCT 46.8 44.6 43.9  MCV 92.1 92.5 95.2  PLT 281 285 [id number]   Basic Metabolic Panel: Recent Labs  Lab 04/10/21 1155 04/11/21 0534 04/12/21 0605  NA 137 142 142  K 3.9 3.2* 3.2*  CL 94* 101 100  CO2 27 32 29  GLUCOSE 189* 145* 128*  BUN 41* 44* 32*  CREATININE 1.56* 1.05 0.82  CALCIUM 9.1 8.3* 8.2*  MG  --  2.8* 3.0*  PHOS  --  3.0 2.4*    Studies: DG Abd 1 View  Result Date:  04/12/2021 CLINICAL DATA:  Small-bowel obstruction re-evaluation. EXAM: ABDOMEN - 1 VIEW COMPARISON:  Abdominal radiograph 04/19/2021. FINDINGS: Redemonstrated multiple gaseous distended loops of small bowel within the central abdomen, not significantly changed from prior exam. Enteric tube visualized within the upper abdomen. Cholecystectomy clips. Lumbar spine degenerative changes. IMPRESSION: Similar-appearing small-bowel obstruction. Electronically Signed   By: Lovey Newcomer M.D.   On: 04/12/2021 09:23    Scheduled Meds:  baclofen  20 mg Oral BID   baclofen  40 mg Oral QHS   enoxaparin (LOVENOX) injection  40 mg Subcutaneous Q24H   gabapentin  1,200 mg Oral TID   nystatin   Topical BID   oxybutynin  10 mg Oral TID   pantoprazole (PROTONIX) IV  40 mg Intravenous Q12H   Continuous Infusions:  sodium chloride 100 mL/hr at 04/12/21 0948   albumin human     ciprofloxacin 400 mg (04/12/21 1159)   potassium chloride 10 mEq (04/12/21 1448)   potassium PHOSPHATE IVPB (in mmol) 30 mmol (  04/12/21 1501)   PRN Meds: acetaminophen **OR** acetaminophen, clonazePAM, magnesium hydroxide, morphine injection, ondansetron **OR** ondansetron (ZOFRAN) IV, traZODone  Time spent: 35 minutes  Author: Val Riles. MD [facility] 04/12/2021 3:33 PM  To reach On-call, see care teams to locate the attending and reach out to them via [email or web address]. If 7PM-7AM, please contact night-coverage If you still have difficulty reaching the attending provider, please page the [facility] (Director on Call) for [facility] on amion for assistance.

## 2021-04-13 ENCOUNTER — Inpatient Hospital Stay: Payer: Medicare Other

## 2021-04-13 LAB — CBC
HCT: 42.5 % (ref 39.0–52.0)
Hemoglobin: 14.1 g/dL (ref 13.0–17.0)
MCH: 31.6 pg (ref 26.0–34.0)
MCHC: 33.2 g/dL (ref 30.0–36.0)
MCV: 95.3 fL (ref 80.0–100.0)
Platelets: 248 10*3/uL (ref 150–400)
RBC: 4.46 MIL/uL (ref 4.22–5.81)
RDW: 13.2 % (ref 11.5–15.5)
WBC: 7 10*3/uL (ref 4.0–10.5)
nRBC: 0 % (ref 0.0–0.2)

## 2021-04-13 LAB — URINE CULTURE: Culture: >=100000 — AB

## 2021-04-13 LAB — MAGNESIUM: Magnesium: 2.9 mg/dL — ABNORMAL HIGH (ref 1.7–2.4)

## 2021-04-13 LAB — PHOSPHORUS: Phosphorus: 2 mg/dL — ABNORMAL LOW (ref 2.5–4.6)

## 2021-04-13 LAB — BASIC METABOLIC PANEL
Anion gap: 8 (ref 5–15)
BUN: 23 mg/dL (ref 8–23)
CO2: 25 mmol/L (ref 22–32)
Calcium: 7.9 mg/dL — ABNORMAL LOW (ref 8.9–10.3)
Chloride: 105 mmol/L (ref 98–111)
Creatinine, Ser: 0.59 mg/dL — ABNORMAL LOW (ref 0.61–1.24)
GFR, Estimated: >60 mL/min (ref >60)
Glucose, Bld: 122 mg/dL — ABNORMAL HIGH (ref 70–99)
Potassium: 3 mmol/L — ABNORMAL LOW (ref 3.5–5.1)
Sodium: 138 mmol/L (ref 135–145)

## 2021-04-13 MED ORDER — POTASSIUM CHLORIDE 10 MEQ/100ML IV SOLN
10.0000 meq | INTRAVENOUS | Status: AC
  Administered 2021-04-13 (×4): 10 meq via INTRAVENOUS
  Filled 2021-04-13: qty 100

## 2021-04-13 MED ORDER — DIPHENHYDRAMINE HCL 50 MG/ML IJ SOLN: 25.0000 mg | Freq: Four times a day (QID) | INTRAMUSCULAR | Status: DC | PRN

## 2021-04-13 MED ORDER — SODIUM CHLORIDE 0.9 % IV SOLN
2.0000 g | Freq: Three times a day (TID) | INTRAVENOUS | Status: DC
  Administered 2021-04-13 – 2021-04-18 (×15): 2 g via INTRAVENOUS
  Filled 2021-04-13 (×17): qty 2

## 2021-04-13 MED ORDER — MELATONIN 5 MG PO TABS
5.0000 mg | ORAL_TABLET | Freq: Every day | ORAL | Status: DC
  Administered 2021-04-13 – 2021-04-21 (×9): 5 mg via ORAL
  Filled 2021-04-13 (×9): qty 1

## 2021-04-13 MED ORDER — POTASSIUM PHOSPHATES 15 MMOLE/5ML IV SOLN
30.0000 mmol | Freq: Once | INTRAVENOUS | Status: AC
  Administered 2021-04-13: 30 mmol via INTRAVENOUS
  Filled 2021-04-13: qty 10

## 2021-04-13 NOTE — TOC Initial Note (Signed)
Transition of Care [facility]) - Initial/Assessment Note    Patient Details  Name: Ronald Santiago MRN: [id number] Date of Birth: 1945/09/14  [facility] CM/SW Contact:    Ronald Price, RN Phone Number: 04/13/2021, 4:21 PM  Clinical Narrative:  1/22: Ronald Santiago with spouse, Ronald Santiago, who was in the patient's room. PCP and Pharmacy confirmed with what is in chart. Patient already open with [facility] services with PT/OT/HH aide. Able to pay for and obtain medications. Patient has a suprapubic catheter present. Pressure wound present PTA. Spouse said transportation was becoming an issue as patient MS progression is worsening his ability to transfer to wheelchair to even use Medical Transport. This is an area they may need help with and spouse said it was day by day right now. Explained TOC will monitor for progress/needs and  a weekday CM will reach out to her as well. Patient is unable to participate in therapies yet due to blocked bowel and she is concerned about that making him weaker. TOC to follow this situation. Ronald Davies RN CM        PCP: Dr. Adrian Prows    [id number] - [place], [place] (Spouse)  [phone] (Mobile)       Patient Goals and CMS Choice        Expected Discharge Plan and Services                                                Prior Living Arrangements/Services                       Activities of Daily Living Home Assistive Devices/Equipment: Electric scooter ADL Screening (condition at time of admission) Patient's cognitive ability adequate to safely complete daily activities?: Yes Is the patient deaf or have difficulty hearing?: No Does the patient have difficulty seeing, even when wearing glasses/contacts?: No Does the patient have difficulty concentrating, remembering, or making decisions?: No Patient able to express need for assistance with ADLs?:  Yes Does the patient have difficulty dressing or bathing?: Yes Independently performs ADLs?: No Communication: Independent Dressing (OT): Needs assistance Is this a change from baseline?: Pre-admission baseline Grooming: Needs assistance Is this a change from baseline?: Pre-admission baseline Feeding: Independent Bathing: Needs assistance Is this a change from baseline?: Pre-admission baseline Toileting: Needs assistance Is this a change from baseline?: Pre-admission baseline In/Out Bed: Needs assistance Is this a change from baseline?: Pre-admission baseline Walks in Home: Needs assistance, Dependent Is this a change from baseline?: Pre-admission baseline Does the patient have difficulty walking or climbing stairs?: Yes Weakness of Legs: Both Weakness of Arms/Hands: Both  Permission Sought/Granted                  Emotional Assessment              Admission diagnosis:  SBO (small bowel obstruction) (HCC) [K56.609] Nausea and vomiting, unspecified vomiting type [R11.2] Patient Active Problem List   Diagnosis Date Noted   SBO (small bowel obstruction) ([place]) 04/10/2021   PCP:  Leonel Ramsay, MD Pharmacy:   [id number] - [place], [place] [address] [place] [address] Phone: [phone] Fax: [phone]     Social Determinants of Health (  SDOH) Interventions    Readmission Risk Interventions No flowsheet data found.

## 2021-04-13 NOTE — Progress Notes (Signed)
[facility] Progress Note  Patient: Ronald Santiago    [id number]  DOA: 04/10/2021     Date of Service: the patient was seen and examined on 04/13/2021  Chief Complaint  Patient presents with   Abdominal Pain   Brief hospital course: Olof Fadely is a 76 y.o. 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  He has a right ischial large decubitus ulcer that is cared for by home health nurse.  He admitted to fever of 102 on Tuesday and chills.  No fever on the day of admission.  ED Course: When the patient came to the ER pulse oximetry was 95% on 2 L of O2 by nasal cannula and vital signs were otherwise normal.  Labs revealed chloride of 94 and glucose of 189, BUN of 41 creatinine 1.56 previously normal, anion gap of 16 to obtain of 8.6.  CBC was within normal.  UA was positive for UTI and showed proteinuria and ketonuria.   EKG as reviewed by me : EKG showed normal sinus rhythm with a rate of 90 with ventricular bigeminy left atrial enlargement. Imaging: Portable chest ray showed mild diffuse interstitial pulmonary opacity that may reflect edema or infection and left hemidiaphragm elevation.  Abdominal pelvic CT scan revealed: 1. Marked distention of the stomach, duodenum, and jejunum. Although a discrete transition zone cannot be identified, the distal and terminal ileum is completely decompressed. Imaging features are compatible with small bowel obstruction although etiology is not apparent on this exam. No small bowel wall thickening or pneumatosis. No intraperitoneal free fluid. 2. The appendix is fluid-filled and distended up to 9 mm diameter but there is no periappendiceal edema or inflammation. 3. Hepatic and renal cysts. 4. Aortic Atherosclerosis.   The patient was  given 1 mg of IV Dilaudid, 10 mg of IV Reglan and 1 L bolus of IV normal saline.  He will be admitted to a medical bed for further evaluation and management.  Assessment and Plan: Principal Problem:   SBO (small bowel obstruction) (HCC)   Small bowel obstruction. continue hydration with IV normal saline. optimize electrolytes. Pain management will be provided. abdomen x-ray did not show any difference in the SBO General surgery consult appreciated Patient may need Gastrografin, no emergent surgical intervention at this time.  Hypokalemia, potassium repleted.  UTI,  S/p ciprofloxacin 1/19--1/22 urine culture growing Proteus vulgaris, resistant to Cipro, sensitive to cefepime and imipenem 1/22 started cefepime 2 g IV daily   Hypokalemia, potassium repleted. Hypophosphatemia, phosphorus repleted.  Acute kidney injury likely prerenal due to nausea and vomiting. AKI resolved with IV fluids, creatinine within normal range  Primary progressive multiple sclerosis. - We will continue his baclofen, Ditropan, Movantik as well as Klonopin.  Stage IV ischial decubitus ulcer. - His ulcer does not look infected. - We will continue same home care. - Wound care consult will be obtained. Frequent turning every 2 hourly  Body mass index is 26.95 kg/m.  Interventions:       Diet: NPO DVT Prophylaxis: Subcutaneous Lovenox   Advance goals of care discussion: Full code  Family Communication: family was present at bedside, at the time of interview.  The pt provided permission to discuss medical plan with the family. Opportunity was given to ask question and all questions were answered satisfactorily.   Disposition:  Pt is from Home, admitted with SBO, still has SBO, NG tube with LIS and NPO, IVF, which precludes a safe discharge. Discharge to Home, when clinically stable, may require 3 to 5 days.  Subjective: No significant events overnight, patient stated that he is passing gas and  had 1 bowel movement.  Mild abdominal pain, denied any chest pain or palpitations, no shortness of breath.  Patient was having difficulty sleeping at nighttime, agreed for the melatonin tonight.   Physical Exam: General:  alert oriented to time, place, and person.  Appear in mild distress, affect appropriate Eyes: PERRLA ENT: Oral Mucosa Clear, dry  Neck: no JVD,  Cardiovascular: S1 and S2 Present, no Murmur,  Respiratory: good respiratory effort, Bilateral Air entry equal and Decreased, no Crackles, no wheezes Abdomen: Bowel Sound sluggish, firm, slightly distended, mild generalized tenderness Skin: No rashes Extremities: no Pedal edema, no calf tenderness Neurologic: without any new focal findings Gait not checked due to patient safety concerns  Vitals:   04/12/21 0829 04/12/21 2038 04/13/21 0459 04/13/21 0809  BP: 131/69 139/61 (!) 144/78 125/72  Pulse: 79 74 83 76  Resp: 18 16 16 17   Temp: 98.2 F (36.8 C) 98.1 F (36.7 C) 98.4 F (36.9 C) 98 F (36.7 C)  TempSrc: Oral Oral Oral Oral  SpO2: 94% 94% 96% 91%  Weight:      Height:        Intake/Output Summary (Last 24 hours) at 04/13/2021 1432 Last data filed at 04/13/2021 1133 Gross per 24 hour  Intake 2030.76 ml  Output 2250 ml  Net -219.24 ml   Filed Weights   04/10/21 1150  Weight: 90.1 kg    Data Reviewed: I have personally reviewed and interpreted daily labs, tele strips, imagings as discussed above. I reviewed all nursing notes, pharmacy notes, vitals, pertinent old records I have discussed plan of care as described above with RN and patient/family.  CBC: Recent Labs  Lab 04/10/21 1155 04/11/21 0534 04/12/21 0605 04/13/21 0625  WBC 9.0 9.3 7.2 7.0  HGB 15.8 14.9 14.5 14.1  HCT 46.8 44.6 43.9 42.5  MCV 92.1 92.5 95.2 95.3  PLT 281 285 280 [id number]   Basic Metabolic Panel: Recent Labs  Lab 04/10/21 1155 04/11/21 0534 04/12/21 0605 04/13/21 0625  NA 137 142 142 138  K 3.9 3.2* 3.2* 3.0*  CL 94*  101 100 105  CO2 27 32 29 25  GLUCOSE 189* 145* 128* 122*  BUN 41* 44* 32* 23  CREATININE 1.56* 1.05 0.82 0.59*  CALCIUM 9.1 8.3* 8.2* 7.9*  MG  --  2.8* 3.0* 2.9*  PHOS  --  3.0 2.4* 2.0*    Studies: DG ABD ACUTE 2+V W 1V CHEST  Result Date: 04/13/2021 CLINICAL DATA:  Small bowel obstruction. EXAM: DG ABDOMEN ACUTE WITH 1 VIEW CHEST COMPARISON:  04/12/2021 FINDINGS: The enteric tube tip and side port course below the level of the GE junction terminating in the gastric fundus. Dilated small bowel loops are again noted and appear unchanged from the previous exam. Lungs remain clear. Mild asymmetric elevation of the left hemidiaphragm. IMPRESSION: 1. No change in small bowel obstruction pattern. 2. The enteric tube tip is in the gastric fundus. Electronically Signed   By: Kerby Moors M.D.   On: 04/13/2021 09:19    Scheduled Meds:  baclofen  20 mg Oral BID   baclofen  40 mg Oral QHS   enoxaparin (LOVENOX) injection  40 mg Subcutaneous Q24H   gabapentin  1,200  mg Oral TID   loratadine  10 mg Oral Daily   melatonin  5 mg Oral QHS   nystatin   Topical BID   oxybutynin  10 mg Oral TID   pantoprazole (PROTONIX) IV  40 mg Intravenous Q12H   Continuous Infusions:  sodium chloride 100 mL/hr at 04/13/21 0402   ceFEPime (MAXIPIME) IV     potassium PHOSPHATE IVPB (in mmol) 30 mmol (04/13/21 1240)   PRN Meds: acetaminophen **OR** acetaminophen, clonazePAM, diphenhydrAMINE, guaiFENesin-dextromethorphan, magnesium hydroxide, morphine injection, ondansetron **OR** ondansetron (ZOFRAN) IV, traZODone  Time spent: 35 minutes  Author: Val Riles. MD [facility] 04/13/2021 2:32 PM  To reach On-call, see care teams to locate the attending and reach out to them via [email or web address]. If 7PM-7AM, please contact night-coverage If you still have difficulty reaching the attending provider, please page the [facility] (Director on Call) for [facility] on amion for assistance.

## 2021-04-13 NOTE — Progress Notes (Signed)
CC: sbo Subjective: Had large BM and feels better xray pers revieweed still dilated loops Wbc nml  Objective: Vital signs in last 24 hours: Temp:  [98 F (36.7 C)-98.7 F (37.1 C)] 98.4 F (36.9 C) (01/22 2212) Pulse Rate:  [45-83] 72 (01/22 2212) Resp:  [16-18] 16 (01/22 2212) BP: (125-144)/(64-78) 130/71 (01/22 2212) SpO2:  [91 %-96 %] 94 % (01/22 2212) Last BM Date: 04/13/21  Intake/Output from previous day: 01/21 0701 - 01/22 0700 In: 2030.8 [I.V.:1566.8; IV Piggyback:464] Out: 2300 [Urine:1000; Emesis/NG output:1300] Intake/Output this shift: No intake/output data recorded.  Physical exam:  NAD alert, chronically ill Abd: soft, decrease bs mildly distended. Non tender w/o peritonitis  Lab Results: CBC  Recent Labs    04/12/21 0605 04/13/21 0625  WBC 7.2 7.0  HGB 14.5 14.1  HCT 43.9 42.5  PLT 280 248   BMET Recent Labs    04/12/21 0605 04/13/21 0625  NA 142 138  K 3.2* 3.0*  CL 100 105  CO2 29 25  GLUCOSE 128* 122*  BUN 32* 23  CREATININE 0.82 0.59*  CALCIUM 8.2* 7.9*   PT/INR No results for input(s): LABPROT, INR in the last 72 hours. ABG No results for input(s): PHART, HCO3 in the last 72 hours.  Invalid input(s): PCO2, PO2  Studies/Results: DG Abd 1 View  Result Date: 04/12/2021 CLINICAL DATA:  Small-bowel obstruction re-evaluation. EXAM: ABDOMEN - 1 VIEW COMPARISON:  Abdominal radiograph 04/19/2021. FINDINGS: Redemonstrated multiple gaseous distended loops of small bowel within the central abdomen, not significantly changed from prior exam. Enteric tube visualized within the upper abdomen. Cholecystectomy clips. Lumbar spine degenerative changes. IMPRESSION: Similar-appearing small-bowel obstruction. Electronically Signed   By: Annia Belt M.D.   On: 04/12/2021 09:23   DG ABD ACUTE 2+V W 1V CHEST  Result Date: 04/13/2021 CLINICAL DATA:  Small bowel obstruction. EXAM: DG ABDOMEN ACUTE WITH 1 VIEW CHEST COMPARISON:  04/12/2021 FINDINGS: The  enteric tube tip and side port course below the level of the GE junction terminating in the gastric fundus. Dilated small bowel loops are again noted and appear unchanged from the previous exam. Lungs remain clear. Mild asymmetric elevation of the left hemidiaphragm. IMPRESSION: 1. No change in small bowel obstruction pattern. 2. The enteric tube tip is in the gastric fundus. Electronically Signed   By: Signa Kell M.D.   On: 04/13/2021 09:19    Anti-infectives: Anti-infectives (From admission, onward)    Start     Dose/Rate Route Frequency Ordered Stop   04/13/21 1500  ceFEPIme (MAXIPIME) 2 g in sodium chloride 0.9 % 100 mL IVPB        2 g 200 mL/hr over 30 Minutes Intravenous Every 8 hours 04/13/21 1412     04/11/21 1200  ciprofloxacin (CIPRO) IVPB 400 mg  Status:  Discontinued        400 mg 200 mL/hr over 60 Minutes Intravenous Every 12 hours 04/11/21 1102 04/13/21 1412   04/11/21 1115  cefTRIAXone (ROCEPHIN) 1 g in sodium chloride 0.9 % 100 mL IVPB  Status:  Discontinued        1 g 200 mL/hr over 30 Minutes Intravenous Every 24 hours 04/11/21 1027 04/11/21 1101       Assessment/Plan: SBO continue NGT Improving slowly Not peritonitic nor septic No need for emergent surgical intervention We will follow   Sterling Big, MD, FACS  04/13/2021

## 2021-04-14 ENCOUNTER — Ambulatory Visit: Payer: Medicare Other | Admitting: Cardiovascular Disease

## 2021-04-14 ENCOUNTER — Inpatient Hospital Stay: Payer: Medicare Other

## 2021-04-14 DIAGNOSIS — Z7189 Other specified counseling: Secondary | ICD-10-CM | POA: Diagnosis not present

## 2021-04-14 DIAGNOSIS — K56609 Unspecified intestinal obstruction, unspecified as to partial versus complete obstruction: Secondary | ICD-10-CM | POA: Diagnosis not present

## 2021-04-14 LAB — BASIC METABOLIC PANEL
Anion gap: 5 (ref 5–15)
BUN: 21 mg/dL (ref 8–23)
CO2: 21 mmol/L — ABNORMAL LOW (ref 22–32)
Calcium: 7.6 mg/dL — ABNORMAL LOW (ref 8.9–10.3)
Chloride: 110 mmol/L (ref 98–111)
Creatinine, Ser: 0.6 mg/dL — ABNORMAL LOW (ref 0.61–1.24)
GFR, Estimated: >60 mL/min (ref >60)
Glucose, Bld: 118 mg/dL — ABNORMAL HIGH (ref 70–99)
Potassium: 3.6 mmol/L (ref 3.5–5.1)
Sodium: 136 mmol/L (ref 135–145)

## 2021-04-14 LAB — CBC
HCT: 40.9 % (ref 39.0–52.0)
Hemoglobin: 13.5 g/dL (ref 13.0–17.0)
MCH: 30.5 pg (ref 26.0–34.0)
MCHC: 33 g/dL (ref 30.0–36.0)
MCV: 92.5 fL (ref 80.0–100.0)
Platelets: 216 10*3/uL (ref 150–400)
RBC: 4.42 MIL/uL (ref 4.22–5.81)
RDW: 12.9 % (ref 11.5–15.5)
WBC: 7.2 10*3/uL (ref 4.0–10.5)
nRBC: 0 % (ref 0.0–0.2)

## 2021-04-14 LAB — PHOSPHORUS: Phosphorus: 2.1 mg/dL — ABNORMAL LOW (ref 2.5–4.6)

## 2021-04-14 LAB — MAGNESIUM: Magnesium: 2.7 mg/dL — ABNORMAL HIGH (ref 1.7–2.4)

## 2021-04-14 MED ORDER — POTASSIUM PHOSPHATES 15 MMOLE/5ML IV SOLN
15.0000 mmol | Freq: Once | INTRAVENOUS | Status: AC
  Administered 2021-04-14: 15 mmol via INTRAVENOUS
  Filled 2021-04-14 (×2): qty 5

## 2021-04-14 NOTE — Progress Notes (Addendum)
[facility] SURGICAL PROGRESS NOTE (cpt [phone])  Hospital Day(s): 4.   Interval History: Patient seen and examined, no acute events or new complaints overnight. Patient reports he continues to feel better. He denied any fever, chills, abdominal pain, nausea, emesis. His laboratory work up this morning is relatively reassuring aside from mild hypermagnesemia and hypophosphatemia. NGT with 800 ccs out recorded; bilious. KUB pending this morning. He is NPO. He did have two more BMs recorded.   Review of Systems:  Constitutional: denies fever, chills  HEENT: denies cough or congestion  Respiratory: denies any shortness of breath  Cardiovascular: denies chest pain or palpitations  Gastrointestinal: denied abdominal pain N/V Genitourinary: denies burning with urination or urinary frequency Musculoskeletal: denies pain, decreased motor or sensation  Vital signs in last 24 hours: [min-max] current  Temp:  [97.7 F (36.5 C)-98.7 F (37.1 C)] 97.7 F (36.5 C) (01/23 0748) Pulse Rate:  [45-78] 78 (01/23 0748) Resp:  [16-18] 18 (01/23 0748) BP: (121-144)/(64-85) 144/85 (01/23 0748) SpO2:  [91 %-96 %] 95 % (01/23 0748)     Height: 6' (182.9 cm) Weight: 90.1 kg BMI (Calculated): 26.94   Intake/Output last 2 shifts:  01/22 0701 - 01/23 0700 In: 2533.4 [I.V.:2333.4; IV Piggyback:200] Out: 1400 [Urine:600; Emesis/NG output:800]   Physical Exam:  Constitutional: alert, cooperative and no distress  HENT: normocephalic without obvious abnormality; NGT in place; bilious  Eyes: PERRL, EOM's grossly intact and symmetric  Respiratory: Breathing non-labored at rest  Cardiovascular: Regular rate and sinus rhythm  Gastrointestinal: Soft, non-tender this morning, he does not appear distended but still tympanic, no rebound/guarding Genitourinary: Suprapubic foley in place  Labs:  CBC Latest Ref Rng & Units 04/14/2021 04/13/2021 04/12/2021  WBC 4.0 - 10.5 K/uL 7.2 7.0 7.2  Hemoglobin 13.0 -  17.0 g/dL 13.5 14.1 14.5  Hematocrit 39.0 - 52.0 % 40.9 42.5 43.9  Platelets 150 - 400 K/uL 216 248 280   CMP Latest Ref Rng & Units 04/14/2021 04/13/2021 04/12/2021  Glucose 70 - 99 mg/dL 118(H) 122(H) 128(H)  BUN 8 - 23 mg/dL 21 23 32(H)  Creatinine 0.61 - 1.24 mg/dL 0.60(L) 0.59(L) 0.82  Sodium 135 - 145 mmol/L 136 138 142  Potassium 3.5 - 5.1 mmol/L 3.6 3.0(L) 3.2(L)  Chloride 98 - 111 mmol/L 110 105 100  CO2 22 - 32 mmol/L 21(L) 25 29  Calcium 8.9 - 10.3 mg/dL 7.6(L) 7.9(L) 8.2(L)  Total Protein 6.5 - 8.1 g/dL - - -  Total Bilirubin 0.3 - 1.2 mg/dL - - -  Alkaline Phos 38 - 126 U/L - - -  AST 15 - 41 U/L - - -  ALT 0 - 44 U/L - - -    Imaging studies:   KUB (04/14/2021) pending   Assessment/Plan: (ICD-10's: K56.609) 76 y.o. male with probable small bowel obstruction -vs- ileus in setting of UTI -vs- chronic constipation -vs- complication of MS  - He is certainly clinically improving and having return of bowel function. NGT output still somewhat high overnight. I will await repeat KUB this morning. If his bowel dilation is improved; we will proceed with clamping trial of NGT.               - Recommend NPO and IVF resuscitation              - Continue Abx for UTI             - Monitor abdominal examination; on-going bowel function - Serial KUB as needed -  Pain control prn; antiemetics prn - Monitor renal function; AKI improved             - Further management per primary service; we will follow  All of the above findings and recommendations were discussed with the patient, patient's family (wife), and the medical team, and all of patient's and family's questions were answered to their expressed satisfaction.  -- Edison Simon, PA-C [facility] 04/14/2021, 7:53 AM [phone] M-F: 7am - 4pm

## 2021-04-14 NOTE — Consult Note (Addendum)
Consultation Note Date: 04/14/2021   Patient Name: Ronald Santiago  DOB: July 02, 1945  MRN: [id number]  Age / Sex: 76 y.o., male  PCP: Mick Sell, MD Referring Physician: Gillis Santa, MD  Reason for Consultation: Establishing goals of care  HPI/Patient Profile: Madhav Mohon is a 76 y.o. 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  There was no bright red blood and he denied any melena or bright red bleeding per rectum.  He has been having dry cough over the last couple days without dyspnea or wheezing or hemoptysis.  No chest pain or palpitations.  He admitted to headache.  His last bowel movement was fairly small on Tuesday and since then he had no bowel movements.  He has a right ischial large decubitus ulcer that is cared for by home health nurse.   Clinical Assessment and Goals of Care: Patient is resting in bed with NGt in place. His wife is at his bedside. He has 2 children from a previous marriage and she has children from a previous marriage as well.  They tell me that they were her boyfriend and girlfriend back in their early teens and then went there own way for much of their life.  She states they found each other again on Facebook, got together, got married, and moved to [place].  Patient states he has had MS since 31 years.  He states he has been told for years that his prognosis is limited.  He states he knows MS is progressive, but states that had he listen to his doctors, he would have been dead years ago.    He states 2 years ago he was able to use forearm crutches and could walk half a mile at a time.  He states he began using a walker and was able to do laps around the house.  He  states now that he has the decubitus ulcers on his backside, he has been told to remain in the bed 22 hours out of each day.  This has been of great impact to his quality of life.  He states he realizes with being in bed, this deconditions him.  He no longer has home PT/OT as he states he knows the exercises that need to be done to try to preserve strength.  He states he has good days and bad days.  He states on good days he gets in his wheelchair and he will go outside.  On bad days he spends his time in bed.  Upon broaching goals of care, wife states "we both have advanced directives, so I know his wishes as long as they have not changed".  She states the advance directive says that he would not want to be kept alive by artificial means; he agrees with this statement. He states he would not want a feeding tube.  When trying to discuss the advance directives in more detail, patient did not speak.  Wife states he used to have a DNR status, but once they got back together, he threw his DNR form in the trash.  Upon trying to discuss this with patient, he just states he did not want it anymore.  Encouraged them to speak together so that she is sure of his wishes.  She states that she is a woman of faith, and she does believe that there are things worse than death. Discussed them talking about any boundaries he may have in care.  He then asked his wife to bathe him, and stated "we need to go ahead and get with it."  Wife does discuss that she needs help with his mobility, or  wheelchair conversion.  She states that his new wheelchair is too heavy to go on the back of the vehicle.  Offered for outpatient PMT to follow, patient did not speak.   SUMMARY OF RECOMMENDATIONS   Would recommend outpatient palliative if patient is amenable.  Full code/full scope.       Primary Diagnoses: Present on Admission:  SBO (small bowel obstruction) (HCC)   I have reviewed the medical record, interviewed the patient and  family, and examined the patient. The following aspects are pertinent.  Past Medical History:  Diagnosis Date   Chronic kidney disease    Multiple sclerosis, primary progressive (HCC)    Social History   Socioeconomic History   Marital status: Married    Spouse name: Not on file   Number of children: Not on file   Years of education: Not on file   Highest education level: Not on file  Occupational History   Not on file  Tobacco Use   Smoking status: Some Days   Smokeless tobacco: Never  Substance and Sexual Activity   Alcohol use: Not on file   Drug use: Not on file   Sexual activity: Not on file  Other Topics Concern   Not on file  Social History Narrative   Not on file   Social Determinants of Health   Financial Resource Strain: Not on file  Food Insecurity: Not on file  Transportation Needs: Not on file  Physical Activity: Not on file  Stress: Not on file  Social Connections: Not on file   History reviewed. No pertinent family history. Scheduled Meds:  baclofen  20 mg Oral BID   baclofen  40 mg Oral QHS   enoxaparin (LOVENOX) injection  40 mg Subcutaneous Q24H   gabapentin  1,200 mg Oral TID   loratadine  10 mg Oral Daily   melatonin  5 mg Oral QHS   nystatin   Topical BID   oxybutynin  10 mg Oral TID   pantoprazole (PROTONIX) IV  40 mg Intravenous Q12H   Continuous Infusions:  sodium chloride 100 mL/hr at 04/14/21 9509   ceFEPime (MAXIPIME) IV 2 g (04/14/21 0631)   potassium PHOSPHATE IVPB (in mmol)     PRN Meds:.acetaminophen **OR** acetaminophen, clonazePAM, diphenhydrAMINE, guaiFENesin-dextromethorphan, magnesium hydroxide, morphine injection, ondansetron **OR** ondansetron (ZOFRAN) IV, traZODone Medications Prior to Admission:  Prior to Admission medications   Medication Sig Start Date End Date Taking? Authorizing Provider  amoxicillin-clavulanate (AUGMENTIN) 875-125 MG tablet Take 1 tablet by mouth 2 (two) times daily. 12/25/20  Yes [provider]  baclofen (LIORESAL) 20 MG tablet Take 20-40 mg by mouth 3 (three) times daily. 05/21/20  Yes [provider]  cetirizine (ZYRTEC) 10 MG  tablet Take 10 mg by mouth daily.   Yes [provider]  Cholecalciferol (D3-1000 PO) Take 1,000 Units by mouth daily.   Yes [provider]  clonazePAM (KLONOPIN) 1 MG tablet Take 1 mg by mouth at bedtime as needed. 07/02/20  Yes [provider]  Docusate Sodium (DSS) 100 MG CAPS Take 300 mg by mouth at bedtime.   Yes [provider]  gabapentin (NEURONTIN) 600 MG tablet Take 1,200 mg by mouth 3 (three) times daily. 02/14/19  Yes [provider]  Magnesium 400 MG TABS Take 400 mg by mouth daily.   Yes [provider]  MOVANTIK 25 MG TABS tablet Take 25 mg by mouth daily. 05/21/20  Yes [provider]  nystatin (MYCOSTATIN/NYSTOP) powder SMARTSIG:1 Application Topical 2-3 Times Daily 06/13/20  Yes [provider]  oxybutynin (DITROPAN-XL) 10 MG 24 hr tablet Take 10 mg by mouth 3 (three) times daily.   Yes [provider]  oxyCODONE (OXY IR/ROXICODONE) 5 MG immediate release tablet Take 10 mg by mouth 3 (three) times daily. 03/13/21  Yes [provider]  Probiotic Product (PROBIOTIC-10 PO) Take by mouth.   Yes [provider]  Pumpkin Seed 500 MG CAPS Take 1,000 mg by mouth daily.   Yes [provider]  oxybutynin (DITROPAN) 5 MG tablet Take 1 tablet (5 mg total) by mouth 3 (three) times daily. Patient not taking: Reported on 04/10/2021 02/03/21   Alfredo Martinez, MD  Oxycodone HCl 10 MG TABS Take 10 mg by mouth 3 (three) times daily. Patient not taking: Reported on 04/10/2021 04/11/19   [provider]   Allergies  Allergen Reactions   Cephalexin Anaphylaxis and Hives   Review of Systems  Constitutional:        States "I have felt better".   Physical Exam Pulmonary:     Effort: Pulmonary effort is normal.  Neurological:      Mental Status: He is alert.    Vital Signs: BP (!) 144/85    Pulse 78    Temp 97.7 F (36.5 C)    Resp 18    Ht 6' (1.829 m)    Wt 90.1 kg    SpO2 95%    BMI 26.95 kg/m  Pain Scale: 0-10   Pain Score: 0-No pain   SpO2: SpO2: 95 % O2 Device:SpO2: 95 % O2 Flow Rate: .O2 Flow Rate (L/min): 2 L/min  IO: Intake/output summary:  Intake/Output Summary (Last 24 hours) at 04/14/2021 1351 Last data filed at 04/14/2021 1610 Gross per 24 hour  Intake 2533.36 ml  Output 1050 ml  Net 1483.36 ml    LBM: Last BM Date: 04/14/21 Baseline Weight: Weight: 90.1 kg Most recent weight: Weight: 90.1 kg       Time In: 1:10 Time Out: 1:45 Time Total: 35 min Greater than 50%  of this time was spent counseling and coordinating care related to the above assessment and plan.  Signed by: Morton Stall, NP   Please contact Palliative Medicine Team phone at [phone] for questions and concerns.  For individual provider: See Loretha Stapler

## 2021-04-14 NOTE — Plan of Care (Signed)

## 2021-04-14 NOTE — Progress Notes (Signed)
[facility] Progress Note  Patient: Ronald Santiago    TMA:[id number]  DOA: 04/10/2021     Date of Service: the patient was seen and examined on 04/14/2021  Chief Complaint  Patient presents with   Abdominal Pain   Brief hospital course: Ronald Santiago is a 76 y.o. 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  He has a right ischial large decubitus ulcer that is cared for by home health nurse.  He admitted to fever of 102 on Tuesday and chills.  No fever on the day of admission.  ED Course: When the patient came to the ER pulse oximetry was 95% on 2 L of O2 by nasal cannula and vital signs were otherwise normal.  Labs revealed chloride of 94 and glucose of 189, BUN of 41 creatinine 1.56 previously normal, anion gap of 16 to obtain of 8.6.  CBC was within normal.  UA was positive for UTI and showed proteinuria and ketonuria.   EKG as reviewed by me : EKG showed normal sinus rhythm with a rate of 90 with ventricular bigeminy left atrial enlargement. Imaging: Portable chest ray showed mild diffuse interstitial pulmonary opacity that may reflect edema or infection and left hemidiaphragm elevation.  Abdominal pelvic CT scan revealed: 1. Marked distention of the stomach, duodenum, and jejunum. Although a discrete transition zone cannot be identified, the distal and terminal ileum is completely decompressed. Imaging features are compatible with small bowel obstruction although etiology is not apparent on this exam. No small bowel wall thickening or pneumatosis. No intraperitoneal free fluid. 2. The appendix is fluid-filled and distended up to 9 mm diameter but there is no periappendiceal edema or inflammation. 3. Hepatic and renal cysts. 4. Aortic Atherosclerosis.   The patient was  given 1 mg of IV Dilaudid, 10 mg of IV Reglan and 1 L bolus of IV normal saline.  He will be admitted to a medical bed for further evaluation and management.  Assessment and Plan: Principal Problem:   SBO (small bowel obstruction) (HCC)   Small bowel obstruction. continue hydration with IV normal saline. optimize electrolytes. Pain management will be provided. abdomen x-ray did not show any difference in the SBO General surgery consult appreciated Patient may need Gastrografin, no emergent surgical intervention at this time. 1/23AXR persistent small bowel obstruction   UTI,  S/p ciprofloxacin 1/19--1/22 urine culture growing Proteus vulgaris, resistant to Cipro, sensitive to cefepime and imipenem 1/22 started cefepime 2 g IV daily   Hypokalemia, potassium repleted. Hypophosphatemia, phosphorus repleted.  Acute kidney injury likely prerenal due to nausea and vomiting. AKI resolved with IV fluids, creatinine within normal range  Primary progressive multiple sclerosis. - We will continue his baclofen, Ditropan, Movantik as well as Klonopin.  Stage IV ischial decubitus ulcer. - His ulcer does not look infected. - We will continue same home care. - Wound care consult will be obtained. Frequent turning every 2 hourly  Body mass index is 26.95 kg/m.  Interventions:       Diet: NPO DVT Prophylaxis: Subcutaneous Lovenox   Advance goals of care discussion: Full code  Family Communication: family was present at bedside, at the time of interview.  The pt provided permission to discuss medical plan with the family. Opportunity was given to ask question and all questions were answered satisfactorily.  Disposition:  Pt is from Home, admitted with SBO, still has SBO, NG tube with LIS and NPO, IVF, which precludes a safe discharge. Discharge to Home, when clinically stable, may require 3 to 5 days.  Subjective: No significant events overnight, patient was complaining of  generalized body ache and pain is 6/10, pain gets better after IV morphine.  Patient is passing gas and had 1 bowel movement yesterday morning and 2 bowel movement today morning.  Patient did not get enough sleep last night.  Denied any chest pain or palpitations, no shortness of breath.    Physical Exam: General:  alert oriented to time, place, and person.  Appear in mild distress, affect appropriate Eyes: PERRLA ENT: Oral Mucosa Clear, dry  Neck: no JVD,  Cardiovascular: S1 and S2 Present, no Murmur,  Respiratory: good respiratory effort, Bilateral Air entry equal and Decreased, no Crackles, no wheezes Abdomen: Bowel Sound sluggish, firm, slightly distended, mild generalized tenderness Skin: No rashes Extremities: no Pedal edema, no calf tenderness Neurologic: without any new focal findings Gait not checked due to patient safety concerns  Vitals:   04/13/21 1602 04/13/21 2212 04/14/21 0400 04/14/21 0748  BP: 138/64 130/71 121/68 (!) 144/85  Pulse: (!) 45 72 72 78  Resp: 18 16 18 18   Temp: 98.7 F (37.1 C) 98.4 F (36.9 C) 98 F (36.7 C) 97.7 F (36.5 C)  TempSrc: Oral Oral Oral   SpO2: 96% 94% 96% 95%  Weight:      Height:        Intake/Output Summary (Last 24 hours) at 04/14/2021 1513 Last data filed at 04/14/2021 1353 Gross per 24 hour  Intake 3046.95 ml  Output 1050 ml  Net 1996.95 ml   Filed Weights   04/10/21 1150  Weight: 90.1 kg    Data Reviewed: I have personally reviewed and interpreted daily labs, tele strips, imagings as discussed above. I reviewed all nursing notes, pharmacy notes, vitals, pertinent old records I have discussed plan of care as described above with RN and patient/family.  CBC: Recent Labs  Lab 04/10/21 1155 04/11/21 0534 04/12/21 0605 04/13/21 0625 04/14/21 0454  WBC 9.0 9.3 7.2 7.0 7.2  HGB 15.8 14.9 14.5 14.1 13.5  HCT 46.8 44.6 43.9 42.5 40.9  MCV 92.1 92.5 95.2 95.3 92.5  PLT 281 285 280 248 [id number]   Basic Metabolic  Panel: Recent Labs  Lab 04/10/21 1155 04/11/21 0534 04/12/21 0605 04/13/21 0625 04/14/21 0454  NA 137 142 142 138 136  K 3.9 3.2* 3.2* 3.0* 3.6  CL 94* 101 100 105 110  CO2 27 32 29 25 21*  GLUCOSE 189* 145* 128* 122* 118*  BUN 41* 44* 32* 23 21  CREATININE 1.56* 1.05 0.82 0.59* 0.60*  CALCIUM 9.1 8.3* 8.2* 7.9* 7.6*  MG  --  2.8* 3.0* 2.9* 2.7*  PHOS  --  3.0 2.4* 2.0* 2.1*    Studies: DG ABD ACUTE 2+V W 1V CHEST  Result Date: 04/14/2021 CLINICAL DATA:  Pain, small-bowel obstruction EXAM: DG ABDOMEN ACUTE WITH 1 VIEW CHEST COMPARISON:  04/13/2021 FINDINGS: Nasogastric tube coiled in proximal stomach. Normal heart size, mediastinal contours, and pulmonary vascularity. Atherosclerotic calcification aorta. Bronchitic changes and minimal bibasilar atelectasis. No pulmonary infiltrate, pleural effusion or pneumothorax. Persistent gaseous distension of small bowel loops consistent with small bowel obstruction. Small amounts of colonic gas. No bowel wall thickening or free air. Suprapubic catheter. Bones demineralized. IMPRESSION: Persistent small bowel obstruction. Bronchitic changes with minimal bibasilar atelectasis. Electronically Signed  By: Lavonia Dana M.D.   On: 04/14/2021 08:41    Scheduled Meds:  baclofen  20 mg Oral BID   baclofen  40 mg Oral QHS   enoxaparin (LOVENOX) injection  40 mg Subcutaneous Q24H   gabapentin  1,200 mg Oral TID   loratadine  10 mg Oral Daily   melatonin  5 mg Oral QHS   nystatin   Topical BID   oxybutynin  10 mg Oral TID   pantoprazole (PROTONIX) IV  40 mg Intravenous Q12H   Continuous Infusions:  sodium chloride Stopped (04/14/21 0801)   ceFEPime (MAXIPIME) IV 2 g (04/14/21 1433)   potassium PHOSPHATE IVPB (in mmol)     PRN Meds: acetaminophen **OR** acetaminophen, clonazePAM, diphenhydrAMINE, guaiFENesin-dextromethorphan, magnesium hydroxide, morphine injection, ondansetron **OR** ondansetron (ZOFRAN) IV, traZODone  Time spent: 35  minutes  Author: Val Riles. MD [facility] 04/14/2021 3:13 PM  To reach On-call, see care teams to locate the attending and reach out to them via [email or web address]. If 7PM-7AM, please contact night-coverage If you still have difficulty reaching the attending provider, please page the [facility] (Director on Call) for [facility] on amion for assistance.

## 2021-04-14 NOTE — Progress Notes (Signed)
After approximately four and a half hours of clamped NGT, patient had ~51mL residual output. Zach PA notified.

## 2021-04-15 DIAGNOSIS — K56609 Unspecified intestinal obstruction, unspecified as to partial versus complete obstruction: Secondary | ICD-10-CM | POA: Diagnosis not present

## 2021-04-15 LAB — MAGNESIUM: Magnesium: 2.4 mg/dL (ref 1.7–2.4)

## 2021-04-15 LAB — CBC
HCT: 38.2 % — ABNORMAL LOW (ref 39.0–52.0)
Hemoglobin: 12.7 g/dL — ABNORMAL LOW (ref 13.0–17.0)
MCH: 30.6 pg (ref 26.0–34.0)
MCHC: 33.2 g/dL (ref 30.0–36.0)
MCV: 92 fL (ref 80.0–100.0)
Platelets: 205 10*3/uL (ref 150–400)
RBC: 4.15 MIL/uL — ABNORMAL LOW (ref 4.22–5.81)
RDW: 12.9 % (ref 11.5–15.5)
WBC: 5.3 10*3/uL (ref 4.0–10.5)
nRBC: 0 % (ref 0.0–0.2)

## 2021-04-15 LAB — BASIC METABOLIC PANEL
Anion gap: 7 (ref 5–15)
BUN: 18 mg/dL (ref 8–23)
CO2: 20 mmol/L — ABNORMAL LOW (ref 22–32)
Calcium: 7.8 mg/dL — ABNORMAL LOW (ref 8.9–10.3)
Chloride: 108 mmol/L (ref 98–111)
Creatinine, Ser: 0.54 mg/dL — ABNORMAL LOW (ref 0.61–1.24)
GFR, Estimated: >60 mL/min (ref >60)
Glucose, Bld: 99 mg/dL (ref 70–99)
Potassium: 3.8 mmol/L (ref 3.5–5.1)
Sodium: 135 mmol/L (ref 135–145)

## 2021-04-15 LAB — PHOSPHORUS: Phosphorus: 2 mg/dL — ABNORMAL LOW (ref 2.5–4.6)

## 2021-04-15 MED ORDER — OXYCODONE HCL 5 MG PO TABS
5.0000 mg | ORAL_TABLET | Freq: Four times a day (QID) | ORAL | Status: DC | PRN
  Administered 2021-04-16 – 2021-04-20 (×9): 5 mg via ORAL
  Filled 2021-04-15 (×9): qty 1

## 2021-04-15 MED ORDER — K PHOS MONO-SOD PHOS DI & MONO 155-852-130 MG PO TABS
500.0000 mg | ORAL_TABLET | Freq: Three times a day (TID) | ORAL | Status: AC
  Administered 2021-04-15 (×3): 500 mg via ORAL
  Filled 2021-04-15 (×3): qty 2

## 2021-04-15 MED ORDER — SODIUM CHLORIDE 0.9 % IV SOLN: INTRAVENOUS | Status: DC | PRN

## 2021-04-15 NOTE — Progress Notes (Signed)
PT Cancellation Note  Patient Details Name: Ronald Santiago MRN: [id number] DOB: [birth year]   Cancelled Treatment:    Reason Eval/Treat Not Completed: Patient and spouse both present and both agreed that they do not wish to receive PT services in the hospital. Will complete PT orders at this time but will reassess pt pending a change in status upon receipt of new PT orders.    Ovidio Hanger PT, DPT 04/15/21, 4:03 PM

## 2021-04-15 NOTE — Progress Notes (Signed)
[facility] SURGICAL PROGRESS NOTE (cpt [phone])  Hospital Day(s): 5.   Interval History: Patient seen and examined, no acute events or new complaints overnight. Patient reports he continues to make improvements. He denied any fever, chills, abdominal pain, nausea, emesis. His laboratory work up this morning is reassuring aside from mild hypophosphatemia. NGT removed yesterday (01/23) after passing clamping trial. He has been on CLD and tolerating this without issues. Continues to endorse bowel function and has BM recorded.   Review of Systems:  Constitutional: denies fever, chills  HEENT: denies cough or congestion  Respiratory: denies any shortness of breath  Cardiovascular: denies chest pain or palpitations  Gastrointestinal: denied abdominal pain N/V Genitourinary: denies burning with urination or urinary frequency Musculoskeletal: denies pain, decreased motor or sensation  Vital signs in last 24 hours: [min-max] current  Temp:  [97.9 F (36.6 C)-99.5 F (37.5 C)] 97.9 F (36.6 C) (01/24 0530) Pulse Rate:  [69-80] 69 (01/24 0530) Resp:  [16-18] 16 (01/24 0530) BP: (122-135)/(65-80) 131/76 (01/24 0530) SpO2:  [95 %-97 %] 96 % (01/24 0530)     Height: 6' (182.9 cm) Weight: 90.1 kg BMI (Calculated): 26.94   Intake/Output last 2 shifts:  01/23 0701 - 01/24 0700 In: 1293.6 [P.O.:780; I.V.:413.5; IV Piggyback:100.1] Out: 300 [Urine:300]   Physical Exam:  Constitutional: alert, cooperative and no distress  HENT: normocephalic without obvious abnormality Eyes: PERRL, EOM's grossly intact and symmetric  Respiratory: Breathing non-labored at rest  Cardiovascular: Regular rate and sinus rhythm  Gastrointestinal: Soft, non-tender this morning, he does not appear distended but still tympanic, no rebound/guarding Genitourinary: Suprapubic foley in place  Labs:  CBC Latest Ref Rng & Units 04/15/2021 04/14/2021 04/13/2021  WBC 4.0 - 10.5 K/uL 5.3 7.2 7.0  Hemoglobin 13.0 -  17.0 g/dL 12.7(L) 13.5 14.1  Hematocrit 39.0 - 52.0 % 38.2(L) 40.9 42.5  Platelets 150 - 400 K/uL 205 216 248   CMP Latest Ref Rng & Units 04/15/2021 04/14/2021 04/13/2021  Glucose 70 - 99 mg/dL 99 118(H) 122(H)  BUN 8 - 23 mg/dL 18 21 23   Creatinine 0.61 - 1.24 mg/dL 0.54(L) 0.60(L) 0.59(L)  Sodium 135 - 145 mmol/L 135 136 138  Potassium 3.5 - 5.1 mmol/L 3.8 3.6 3.0(L)  Chloride 98 - 111 mmol/L 108 110 105  CO2 22 - 32 mmol/L 20(L) 21(L) 25  Calcium 8.9 - 10.3 mg/dL 7.8(L) 7.6(L) 7.9(L)  Total Protein 6.5 - 8.1 g/dL - - -  Total Bilirubin 0.3 - 1.2 mg/dL - - -  Alkaline Phos 38 - 126 U/L - - -  AST 15 - 41 U/L - - -  ALT 0 - 44 U/L - - -    Imaging studies: No new pertinent imaging studies    Assessment/Plan: (ICD-10's: K35.609) 76 y.o. male with probable small bowel obstruction -vs- ileus in setting of UTI -vs- chronic constipation -vs- complication of MS              - Will advance to full liquid diet; may leave here until at least dinner vs tomorrow morning, then advance to soft diet   - Discontinue fluids as diet advances              - Monitor abdominal examination; on-going bowel function - Pain control prn; antiemetics prn - Monitor renal function; AKI resolved             - Further management per primary service; we will follow    - Discharge planning; pending advancement of  diet, anticipate ready for home tomorrow (01/25)  All of the above findings and recommendations were discussed with the patient, patient's family (wife), and the medical team, and all of patient's and family's questions were answered to their expressed satisfaction.  -- Edison Simon, PA-C [facility] 04/15/2021, 8:14 AM [phone] M-F: 7am - 4pm

## 2021-04-15 NOTE — Evaluation (Signed)
Occupational Therapy Evaluation Patient Details Name: Ronald Santiago MRN: [id number] DOB: [birth year] Today's Date: 04/15/2021   History of Present Illness Ronald Santiago is a 76 y.o. Caucasian male with medical history significant for primary progressive multiple sclerosis, R ischial decubitus ulcer, and chronic kidney disease, who presented to the ER with acute onset of recurrent nausea and vomiting and abdominal pain. Surgery consulted for probable small bowel obstruction -vs- ileus in setting of UTI -vs- chronic constipation -vs- complication of MS. NGT placed. Pt admitted for further mgt.   Clinical Impression   Ronald Santiago was seen for OT evaluation this date. Prior to hospital admission, pt required some assistance with transfers to/from his power WC. Otherwise he was able to complete ADL tasks with set-up assist from his spouse. Pt lives with his spouse in a 1 level home with a ramped entrance. Currently pt demonstrates impairments as described below (See OT problem list) which functionally limit his ability to perform ADL/self-care tasks. Pt currently requires MOD A for UB ADL management at bed level. He has been unable to attempt functional transfers while acutely hospitalized 2/2 not having his leg brace and grab bars as he does at home. Pt declines functional mobility at this time. Anticipate MAX A +2 for bed/functional mobility. MAX A for bed level LB ADL management. Pt would benefit from skilled OT services to address noted impairments and functional limitations (see below for any additional details) in order to maximize safety and independence while minimizing falls risk and caregiver burden. Upon hospital discharge, recommend HHOT to maximize pt safety and return to functional independence during meaningful occupations of daily life.        Recommendations for follow up therapy are one component of a multi-disciplinary discharge planning process, led by the attending physician.  Recommendations may  be updated based on patient status, additional functional criteria and insurance authorization.   Follow Up Recommendations  Home health OT    Assistance Recommended at Discharge Frequent or constant Supervision/Assistance  Patient can return home with the following Two people to help with walking and/or transfers;A lot of help with bathing/dressing/bathroom;Assistance with cooking/housework;Assistance with feeding;Assist for transportation;Help with stairs or ramp for entrance    Functional Status Assessment  Patient has had a recent decline in their functional status and demonstrates the ability to make significant improvements in function in a reasonable and predictable amount of time.  Equipment Recommendations  None recommended by OT (Pt has necessary equipment.)    Recommendations for Other Services       Precautions / Restrictions Precautions Precautions: Fall Restrictions Weight Bearing Restrictions: No Other Position/Activity Restrictions: suprapubic catheter      Mobility Bed Mobility Overal bed mobility: Needs Assistance             General bed mobility comments: Deferred. Pt declines 2/2 R hip wound pain.    Transfers                          Balance Overall balance assessment: Needs assistance                                         ADL either performed or assessed with clinical judgement   ADL Overall ADL's : Needs assistance/impaired  General ADL Comments: Functionally limited 2/2 generalized weakness in setting of prolonged hospitalization in addition to baseline functional deficits. MOD A for UB ADL management including feeding, grooming, etc. Anticipate MAX A +2 for bed/functional mobility.     Vision Baseline Vision/History: 1 Wears glasses Patient Visual Report: No change from baseline       Perception     Praxis      Pertinent Vitals/Pain Pain  Assessment Pain Assessment: 0-10 Pain Score: 3  Pain Location: R hip wound Pain Descriptors / Indicators: Constant, Discomfort Pain Intervention(s): Limited activity within patient's tolerance, Monitored during session     Hand Dominance Left   Extremity/Trunk Assessment Upper Extremity Assessment Upper Extremity Assessment: RUE deficits/detail;LUE deficits/detail RUE Deficits / Details: Active shoulder flexion/elbow extension against gravity. Grossly 2+ to 3/5. Grip notably diminished 2/5. LUE Deficits / Details: Improved functional strength in LUE (baseline per pt). Has adapted utensils, etc.   Lower Extremity Assessment Lower Extremity Assessment: Generalized weakness       Communication Communication Communication: No difficulties   Cognition Arousal/Alertness: Awake/alert Behavior During Therapy: WFL for tasks assessed/performed Overall Cognitive Status: Within Functional Limits for tasks assessed                                       General Comments       Exercises Other Exercises Other Exercises: Pt/caregiver educated on role of OT in acute setting, considerations for DC home with HH vs. STR, Safe use of AE/DME and routines modifications to support safety and functional independence upon hospital DC.   Shoulder Instructions      Home Living Family/patient expects to be discharged to:: Private residence Living Arrangements: Spouse/significant other Available Help at Discharge: Family;Available 24 hours/day Type of Home: House Home Access: Ramped entrance     Home Layout: One level     Bathroom Shower/Tub: Tub/shower unit;Walk-in shower     Bathroom Accessibility: Yes How Accessible: Accessible via wheelchair Home Equipment: Wheelchair - power;Tub bench;Hand held shower head;Grab bars - tub/shower;Grab bars - toilet   Additional Comments: Multiple grab bars placed t/o the home to facilitate t/fs.      Prior Functioning/Environment  Prior Level of Function : Needs assist  Cognitive Assist : ADLs (cognitive);Mobility (cognitive)     Physical Assist : ADLs (physical);Mobility (physical) Mobility (physical): Transfers ADLs (physical): IADLs;Toileting;Dressing;Bathing;Grooming;Feeding Mobility Comments: t/fs to Power [facility] for functional mobility. Generally requires only supervision for t/f to/from power WC from bed, commodde, etc. ADLs Comments: Up until ~ 1 week PTA, pt was able to complete grooming independently, taking a weekly shower with assist from spouse to t/f in/out but no physical assist for bathing. Generally limited with amt of time in WC 2/2 pressure ulcer.        OT Problem List: Decreased strength;Decreased coordination;Decreased range of motion;Decreased activity tolerance;Decreased safety awareness;Impaired balance (sitting and/or standing);Decreased knowledge of use of DME or AE;Impaired UE functional use      OT Treatment/Interventions: Self-care/ADL training;Therapeutic exercise;Therapeutic activities;DME and/or AE instruction;Patient/family education;Energy conservation;Cognitive remediation/compensation    OT Goals(Current goals can be found in the care plan section) Acute Rehab OT Goals Patient Stated Goal: To get my strength back OT Goal Formulation: With patient Time For Goal Achievement: 04/29/21 Potential to Achieve Goals: Good ADL Goals Pt Will Perform Grooming: bed level;sitting;with set-up;with supervision;with caregiver independent in assisting Pt Will Perform Lower Body Dressing: with adaptive equipment;with caregiver independent in  assisting;with mod assist;sitting/lateral leans Pt Will Transfer to Toilet: bedside commode;squat pivot transfer;with min assist  OT Frequency: Min 2X/week    Co-evaluation              AM-PAC OT "6 Clicks" Daily Activity     Outcome Measure Help from another person eating meals?: A Little Help from another person taking care of personal grooming?: A  Little Help from another person toileting, which includes using toliet, bedpan, or urinal?: A Lot Help from another person bathing (including washing, rinsing, drying)?: A Lot Help from another person to put on and taking off regular upper body clothing?: A Lot Help from another person to put on and taking off regular lower body clothing?: A Lot 6 Click Score: 14   End of Session    Activity Tolerance: Patient limited by fatigue;Patient limited by pain Patient left: in bed;with call bell/phone within reach;with bed alarm set;with family/visitor present  OT Visit Diagnosis: Other abnormalities of gait and mobility (R26.89);Other symptoms and signs involving the nervous system (R29.898);Muscle weakness (generalized) (M62.81)                Time: 1330-1400 OT Time Calculation (min): 30 min Charges:  OT General Charges $OT Visit: 1 Visit OT Evaluation $OT Eval High Complexity: 1 High OT Treatments $Self Care/Home Management : 8-22 mins  Rockney Ghee, M.S., OTR/L Feeding Team - [facility] Ascom: [phone] 04/15/21, 4:07 PM

## 2021-04-15 NOTE — Progress Notes (Signed)
[facility] Progress Note  Patient: Ronald Santiago    [id number]  DOA: 04/10/2021     Date of Service: the patient was seen and examined on 04/15/2021  Chief Complaint  Patient presents with   Abdominal Pain   Brief hospital course: Ronald Santiago is a 76 y.o. 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  He has a right ischial large decubitus ulcer that is cared for by home health nurse.  He admitted to fever of 102 on Tuesday and chills.  No fever on the day of admission. In the ED,  VSS, labs showed BUN of 41, creatinine 1.56 previously normal, UA was positive for UTI.  CT abdomen/pelvis showed SBO.  General surgery consulted.  Patient admitted for further management.    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  Stage IV ischial decubitus ulcer No signs of infection Wound care Frequent turning every 2 hours     Body mass index is 26.95 kg/m.  Interventions:       Diet: Full liquid diet DVT Prophylaxis: Subcutaneous Lovenox   Advance goals of care discussion: Full code  Family Communication: Discussed with wife at bedside   Disposition:   Discharge to home with possible home health PT/OT/aide-wife requiring a lot of assistance PT/OT ordered   Subjective:  Denies any new complaints, denies any worsening abdominal pain, nausea/vomiting, fever/chills, chest pain, shortness of breath.  Wife would need more home health assistance to care for patient.    Physical Exam: General: NAD  Cardiovascular: S1, S2 present Respiratory: CTAB Abdomen: Soft, nontender, nondistended, bowel sounds present Musculoskeletal: No bilateral pedal edema noted Skin: Noted decubitus ulcer Psychiatry: Normal mood   Vitals:   04/14/21 1545 04/14/21 2052 04/15/21 0530 04/15/21 0903  BP: 135/80 122/65 131/76 131/72  Pulse: 77 80 69 68  Resp: 18 18 16 16   Temp: 98.3 F (36.8 C) 99.5 F (37.5 C) 97.9 F (36.6 C) 98.1 F (36.7 C)  TempSrc: Oral   Oral  SpO2: 97% 95% 96% 95%  Weight:      Height:        Intake/Output Summary (Last 24 hours) at 04/15/2021 1042 Last data filed at 04/15/2021 0857 Gross per 24 hour  Intake 1293.59 ml  Output 1100 ml  Net 193.59 ml   Filed Weights   04/10/21 1150  Weight: 90.1 kg    CBC: Recent Labs  Lab 04/11/21 0534 04/12/21 0605 04/13/21 0625 04/14/21 0454 04/15/21 0521  WBC 9.3 7.2 7.0 7.2 5.3  HGB 14.9 14.5 14.1 13.5 12.7*  HCT 44.6 43.9 42.5 40.9 38.2*  MCV 92.5 95.2 95.3 92.5 92.0  PLT 285 280 248 [phone]   Basic Metabolic Panel: Recent Labs  Lab 04/11/21 0534 04/12/21 0605 04/13/21 [id number]  04/14/21 0454 04/15/21 0521  NA 142 142 138 136 135  K 3.2* 3.2* 3.0* 3.6 3.8  CL 101 100 105 110 108  CO2 32 29 25 21* 20*  GLUCOSE 145* 128* 122* 118* 99  BUN 44* 32* 23 21 18   CREATININE 1.05 0.82 0.59* 0.60* 0.54*  CALCIUM 8.3* 8.2* 7.9* 7.6* 7.8*  MG 2.8* 3.0* 2.9* 2.7* 2.4  PHOS 3.0 2.4* 2.0* 2.1* 2.0*    Studies: No results found.  Scheduled Meds:  baclofen  20 mg Oral BID   baclofen  40 mg Oral QHS   enoxaparin (LOVENOX) injection  40 mg Subcutaneous Q24H   gabapentin  1,200  mg Oral TID   loratadine  10 mg Oral Daily   melatonin  5 mg Oral QHS   nystatin   Topical BID   oxybutynin  10 mg Oral TID   pantoprazole (PROTONIX) IV  40 mg Intravenous Q12H   Continuous Infusions:  sodium chloride 100 mL/hr at 04/14/21 2127   ceFEPime (MAXIPIME) IV 2 g (04/15/21 0759)   PRN Meds: acetaminophen **OR** acetaminophen, clonazePAM, diphenhydrAMINE, guaiFENesin-dextromethorphan, magnesium hydroxide, morphine injection, ondansetron **OR** ondansetron (ZOFRAN) IV, traZODone    Author: Alma Friendly, MD [facility] 04/15/2021 10:42 AM  To reach On-call, see care teams to locate the attending and reach out to them via [email or web address]. If 7PM-7AM, please contact night-coverage

## 2021-04-15 NOTE — TOC Progression Note (Signed)
Transition of Care [facility]) - Progression Note    Patient Details  Name: Ronald Santiago MRN: [id number] Date of Birth: April 01, 1945  Transition of [facility]) CM/SW Contact  Maree Krabbe, LCSW Phone Number: 04/15/2021, 4:30 PM  Clinical Narrative:   CSW open with [facility] per CSW last note on 1/22. CSW notified pt will need EMS transport home at d/c. CSW will arrange at d/c.         Expected Discharge Plan and Services                                                 Social Determinants of Health (SDOH) Interventions    Readmission Risk Interventions No flowsheet data found.

## 2021-04-16 DIAGNOSIS — N179 Acute kidney failure, unspecified: Secondary | ICD-10-CM | POA: Diagnosis present

## 2021-04-16 DIAGNOSIS — K56609 Unspecified intestinal obstruction, unspecified as to partial versus complete obstruction: Secondary | ICD-10-CM | POA: Diagnosis not present

## 2021-04-16 DIAGNOSIS — L8994 Pressure ulcer of unspecified site, stage 4: Secondary | ICD-10-CM | POA: Diagnosis present

## 2021-04-16 DIAGNOSIS — G35B0 Primary progressive multiple sclerosis, unspecified: Secondary | ICD-10-CM | POA: Diagnosis present

## 2021-04-16 DIAGNOSIS — N390 Urinary tract infection, site not specified: Secondary | ICD-10-CM | POA: Diagnosis present

## 2021-04-16 DIAGNOSIS — G35 Multiple sclerosis: Secondary | ICD-10-CM | POA: Diagnosis present

## 2021-04-16 LAB — CBC
HCT: 41.4 % (ref 39.0–52.0)
Hemoglobin: 14.1 g/dL (ref 13.0–17.0)
MCH: 31.2 pg (ref 26.0–34.0)
MCHC: 34.1 g/dL (ref 30.0–36.0)
MCV: 91.6 fL (ref 80.0–100.0)
Platelets: 235 10*3/uL (ref 150–400)
RBC: 4.52 MIL/uL (ref 4.22–5.81)
RDW: 12.9 % (ref 11.5–15.5)
WBC: 6.7 10*3/uL (ref 4.0–10.5)
nRBC: 0 % (ref 0.0–0.2)

## 2021-04-16 LAB — BASIC METABOLIC PANEL
Anion gap: 6 (ref 5–15)
BUN: 13 mg/dL (ref 8–23)
CO2: 20 mmol/L — ABNORMAL LOW (ref 22–32)
Calcium: 8.1 mg/dL — ABNORMAL LOW (ref 8.9–10.3)
Chloride: 108 mmol/L (ref 98–111)
Creatinine, Ser: 0.59 mg/dL — ABNORMAL LOW (ref 0.61–1.24)
GFR, Estimated: >60 mL/min (ref >60)
Glucose, Bld: 120 mg/dL — ABNORMAL HIGH (ref 70–99)
Potassium: 3.6 mmol/L (ref 3.5–5.1)
Sodium: 134 mmol/L — ABNORMAL LOW (ref 135–145)

## 2021-04-16 MED ORDER — K PHOS MONO-SOD PHOS DI & MONO 155-852-130 MG PO TABS
500.0000 mg | ORAL_TABLET | Freq: Two times a day (BID) | ORAL | Status: AC
  Administered 2021-04-16 (×2): 500 mg via ORAL
  Filled 2021-04-16 (×2): qty 2

## 2021-04-16 MED ORDER — PSYLLIUM 95 % PO PACK
1.0000 | PACK | Freq: Every day | ORAL | Status: DC
  Administered 2021-04-16 – 2021-04-22 (×7): 1 via ORAL
  Filled 2021-04-16 (×8): qty 1

## 2021-04-16 NOTE — Assessment & Plan Note (Addendum)
Continue baclofen, Ditropan, Movantik, gabapentin, Klonopin, oxycodone PRN

## 2021-04-16 NOTE — Care Management Important Message (Signed)
Important Message  Patient Details  Name: Ronald Santiago MRN: [id number] Date of Birth: [birth year]   Medicare Important Message Given:  Yes     Johnell Comings 04/16/2021, 11:44 AM

## 2021-04-16 NOTE — Assessment & Plan Note (Addendum)
Resolved with replacement. Replace phos as needed.

## 2021-04-16 NOTE — Assessment & Plan Note (Addendum)
No signs of infection Wound care Frequent turning every 2 hours -- New hospital bed with undulating mattress, hoyer lift ordered.   Patient and wife report improvement in his wound since admission and think his undulating mattress at home is not functioning correctly.

## 2021-04-16 NOTE — Progress Notes (Signed)
[facility] SURGICAL PROGRESS NOTE (cpt [phone])  Hospital Day(s): 6.   Interval History: Patient seen and examined, no acute events or new complaints overnight. Patient reports he continues to do well. He denied any fever, chills, abdominal pain, nausea, emesis. Main issue this morning is looser stools. His laboratory work up this morning is reassuring. NGT removed yesterday (01/23) after passing clamping trial. He has been on soft and tolerating this without issues. Continues to endorse bowel function and has BM recorded.   Review of Systems:  Constitutional: denies fever, chills  HEENT: denies cough or congestion  Respiratory: denies any shortness of breath  Cardiovascular: denies chest pain or palpitations  Gastrointestinal: denied abdominal pain N/V Genitourinary: denies burning with urination or urinary frequency Musculoskeletal: denies pain, decreased motor or sensation  Vital signs in last 24 hours: [min-max] current  Temp:  [98.1 F (36.7 C)-99 F (37.2 C)] 98.4 F (36.9 C) (01/25 0750) Pulse Rate:  [68-87] 77 (01/25 0750) Resp:  [14-16] 15 (01/25 0750) BP: (122-131)/(71-79) 125/77 (01/25 0750) SpO2:  [93 %-96 %] 93 % (01/25 0750)     Height: 6' (182.9 cm) Weight: 90.1 kg BMI (Calculated): 26.94   Intake/Output last 2 shifts:  01/24 0701 - 01/25 0700 In: 558.5 [I.V.:58.5; IV Piggyback:500] Out: [id number] [Urine:3450]   Physical Exam:  Constitutional: alert, cooperative and no distress  HENT: normocephalic without obvious abnormality Eyes: PERRL, EOM's grossly intact and symmetric  Respiratory: Breathing non-labored at rest  Cardiovascular: Regular rate and sinus rhythm  Gastrointestinal: Soft, non-tender this morning, he does not appear distended but still tympanic, no rebound/guarding Genitourinary: Suprapubic foley in place  Labs:  CBC Latest Ref Rng & Units 04/16/2021 04/15/2021 04/14/2021  WBC 4.0 - 10.5 K/uL 6.7 5.3 7.2  Hemoglobin 13.0 - 17.0 g/dL 14.1  12.7(L) 13.5  Hematocrit 39.0 - 52.0 % 41.4 38.2(L) 40.9  Platelets 150 - 400 K/uL 235 205 216   CMP Latest Ref Rng & Units 04/16/2021 04/15/2021 04/14/2021  Glucose 70 - 99 mg/dL 120(H) 99 118(H)  BUN 8 - 23 mg/dL 13 18 21   Creatinine 0.61 - 1.24 mg/dL 0.59(L) 0.54(L) 0.60(L)  Sodium 135 - 145 mmol/L 134(L) 135 136  Potassium 3.5 - 5.1 mmol/L 3.6 3.8 3.6  Chloride 98 - 111 mmol/L 108 108 110  CO2 22 - 32 mmol/L 20(L) 20(L) 21(L)  Calcium 8.9 - 10.3 mg/dL 8.1(L) 7.8(L) 7.6(L)  Total Protein 6.5 - 8.1 g/dL - - -  Total Bilirubin 0.3 - 1.2 mg/dL - - -  Alkaline Phos 38 - 126 U/L - - -  AST 15 - 41 U/L - - -  ALT 0 - 44 U/L - - -    Imaging studies: No new pertinent imaging studies    Assessment/Plan: (ICD-10's: K11.609) 76 y.o. male with clinically resolved probable small bowel obstruction -vs- ileus in setting of UTI -vs- chronic constipation -vs- complication of MS              - Okay to continue soft/regular diet; Encouraged adding fiber to diet to help with loose stools               - Monitor abdominal examination; on-going bowel function - Pain control prn; antiemetics prn             - Further management per primary service; we will follow    - Discharge planning: Okay for discharge from surgical standpoint. He does NOT need to follow up with surgery.   All  of the above findings and recommendations were discussed with the patient, patient's family (wife), and the medical team, and all of patient's and family's questions were answered to their expressed satisfaction.  -- Edison Simon, PA-C [facility] 04/16/2021, 8:18 AM [phone] M-F: 7am - 4pm

## 2021-04-16 NOTE — Assessment & Plan Note (Addendum)
Overall improving.  NG tube removed and patient's been resumed on diet and overall tolerated. --General surgery following --S/p NG tube, removed on 04/14/2021 --Tolerating soft diet, advance to regular. --Metamucil for fiber supplement and stool bulking. --Pain management as needed --Monitor hydration and electrolytes  --Resumed on home meds for constipation related to MS -- Monitor abdominal exam and bowel activity  1/30: had a good BM. Tolerating diet, belly is softer.

## 2021-04-16 NOTE — Progress Notes (Signed)
°  Progress Note   Patient: Ronald Santiago [id number] DOB: [birth year] DOA: 04/10/2021     6 DOS: the patient was seen and examined on 04/16/2021   Brief hospital course: Ronald Santiago is a 76 y.o. 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  He has a right ischial large decubitus ulcer that is cared for by home health nurse.  He admitted to fever of 102 on Tuesday and chills.  No fever on the day of admission. In the ED,  VSS, labs showed BUN of 41, creatinine 1.56 previously normal, UA was positive for UTI.  CT abdomen/pelvis showed SBO.  General surgery consulted.  Patient admitted for further management.  Assessment and Plan * SBO (small bowel obstruction) ([place])- (present on admission) Mgmt per general surgery. S/p NG tube, removed on 04/14/2021 Tolerating soft diet. Add Metamucil for fiber supplement and stool bulking. Pt having post-obstructive diarrhea. Pain management as needed  Acute lower UTI- (present on admission) POA. Urine culture growing Proteus vulagris resistant to Cipro, sensitive to Cefepime and imipenem.   --Continue Cefepime (started 1/22)  AKI (acute kidney injury) ([place])- (present on admission) POA, resolved with IV hydration.  Hypophosphatemia- (present on admission) Replace phos as needed.  Multiple sclerosis, primary progressive ([place])- (present on admission) Continue baclofen, Ditropan, Movantik, gabapentin, Klonopin  Stage IV decubitus ulcer ([place])- (present on admission) No signs of infection Wound care Frequent turning every 2 hours     Subjective: Pt awake in bed, wife at bedside when seen this AM.  He reports ongoing diarrhea, and severe pain related to his MS spasticity and nerve pain.  No other acute complaints.  No abdominal pain or  nausea/vomiting.  He reports morphine makes me nauseous.  Objective Vital signs were reviewed and unremarkable.  General exam: awake, alert, no acute distress HEENT: atraumatic, clear conjunctiva, anicteric sclera, moist mucus membranes, hearing grossly normal  Respiratory system: CTAB, no wheezes, rales or rhonchi, normal respiratory effort. Cardiovascular system: normal S1/S2,  RRR, no JVD, murmurs, rubs, gallops,  no pedal edema.   Gastrointestinal system: soft, NT, ND, no HSM felt, +bowel sounds. Central nervous system: A&O x3. no gross focal neurologic deficits, normal speech Extremities: hypertonicity noted in UE's, no edema, normal tone Skin: dry, intact, normal temperature, normal color, No rashes, lesions or ulcers Psychiatry: normal mood, congruent affect, judgement and insight appear normal   Data Reviewed:  Labs notable for Na 134, CO2 20, Cr 0.59, CBC normal, glucose 120  Family Communication: wife at bedside on rounds  Disposition: Status is: Inpatient  Remains inpatient appropriate because: ongoing diarrhea with resulting electrolyte abnormalities requiring close monitoring and replacement until stable.         Time spent: 35 minutes  Author: Ezekiel Slocumb, DO 04/16/2021 2:10 PM  For on call review [email or web address].

## 2021-04-16 NOTE — Assessment & Plan Note (Addendum)
POA, resolved. Urine culture growing Proteus vulagris resistant to Cipro, sensitive to Cefepime and imipenem.   -- Completed course of cefepime -- Monitor clinically

## 2021-04-16 NOTE — Assessment & Plan Note (Addendum)
POA, resolved with IV hydration. Monitor BMP.

## 2021-04-16 NOTE — Hospital Course (Signed)
Ronald Santiago is a 76 y.o. 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  He has a right ischial large decubitus ulcer that is cared for by home health nurse.  He admitted to fever of 102 on Tuesday and chills.  No fever on the day of admission. In the ED,  VSS, labs showed BUN of 41, creatinine 1.56 previously normal, UA was positive for UTI.  CT abdomen/pelvis showed SBO.  General surgery consulted.  Patient admitted for further management.

## 2021-04-17 ENCOUNTER — Inpatient Hospital Stay: Payer: Medicare Other

## 2021-04-17 DIAGNOSIS — K56609 Unspecified intestinal obstruction, unspecified as to partial versus complete obstruction: Secondary | ICD-10-CM | POA: Diagnosis not present

## 2021-04-17 LAB — PHOSPHORUS: Phosphorus: 3.3 mg/dL (ref 2.5–4.6)

## 2021-04-17 LAB — MAGNESIUM: Magnesium: 2 mg/dL (ref 1.7–2.4)

## 2021-04-17 LAB — BASIC METABOLIC PANEL
Anion gap: 7 (ref 5–15)
BUN: 14 mg/dL (ref 8–23)
CO2: 24 mmol/L (ref 22–32)
Calcium: 8.3 mg/dL — ABNORMAL LOW (ref 8.9–10.3)
Chloride: 105 mmol/L (ref 98–111)
Creatinine, Ser: 0.61 mg/dL (ref 0.61–1.24)
GFR, Estimated: >60 mL/min (ref >60)
Glucose, Bld: 110 mg/dL — ABNORMAL HIGH (ref 70–99)
Potassium: 3.5 mmol/L (ref 3.5–5.1)
Sodium: 136 mmol/L (ref 135–145)

## 2021-04-17 NOTE — TOC Progression Note (Signed)
Transition of Care [facility]) - Progression Note    Patient Details  Name: Ronald Santiago MRN: [id number] Date of Birth: 10-25-1945  Transition of Care [facility]) CM/SW Contact  Maree Krabbe, LCSW Phone Number: 04/17/2021, 3:24 PM  Clinical Narrative:   Spoke with pt's spouse and pt's spouse is requesting additional services for pt. CSW explained that pt is open with [facility] and pt's spouse confirmed. However, pt's spouse states he was getting a Charity fundraiser about once a month. CSW explained new orders would be placed and the [facility] agency would receive them and the schedule would change including frequency. Pt's spouse also asking about private care givers. CSW printed information and will be provided to bedside.         Expected Discharge Plan and Services                                                 Social Determinants of Health (SDOH) Interventions    Readmission Risk Interventions No flowsheet data found.

## 2021-04-17 NOTE — Progress Notes (Signed)
°  Progress Note   Patient: Ronald Santiago [id number] DOB: Aug 01, 1945 DOA: 04/10/2021     7 DOS: the patient was seen and examined on 04/17/2021   Brief hospital course: Ronald Santiago is a 76 y.o. 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  He has a right ischial large decubitus ulcer that is cared for by home health nurse.  He admitted to fever of 102 on Tuesday and chills.  No fever on the day of admission. In the ED,  VSS, labs showed BUN of 41, creatinine 1.56 previously normal, UA was positive for UTI.  CT abdomen/pelvis showed SBO.  General surgery consulted.  Patient admitted for further management.  Assessment and Plan * SBO (small bowel obstruction) ([place])- (present on admission) Overall improving.  NG tube removed and patient's been resumed on diet and overall tolerated. 1/26: Patient reports increased abdominal discomfort and distention.  Passed minimal amount of gas overnight and no stools since yesterday. --Repeat abdominal x-ray --Mgmt per general surgery. --S/p NG tube, removed on 04/14/2021 --Tolerating soft diet. --Metamucil for fiber supplement and stool bulking. --Pain management as needed  Acute lower UTI- (present on admission) POA. Urine culture growing Proteus vulagris resistant to Cipro, sensitive to Cefepime and imipenem.   --Continue Cefepime (started 1/22)  AKI (acute kidney injury) ([place])- (present on admission) POA, resolved with IV hydration.  Hypophosphatemia- (present on admission) Resolved with replacement. Replace phos as needed.  Multiple sclerosis, primary progressive ([place])- (present on admission) Continue baclofen, Ditropan, Movantik, gabapentin, Klonopin  Stage IV decubitus ulcer ([place])- (present on admission) No signs of  infection Wound care Frequent turning every 2 hours     Subjective: Patient awake resting in bed with wife at bedside when seen today.  He reports increased abdominal discomfort and distention.  He repassed a minimal amount of gas all night and no stool since yesterday.  Reports still feeling like he may be obstructed.  Wife and patient expressed concern about going home with patient more weak than his baseline and hope you will be stronger have to transfer from his wheelchair to commode.  Objective Vital signs reviewed and unremarkable  General exam: awake, alert, no acute distress HEENT: atraumatic, clear conjunctiva, anicteric sclera, moist mucus membranes, hearing grossly normal  Respiratory system: CTAB, no wheezes, rales or rhonchi, normal respiratory effort. Cardiovascular system: normal S1/S2, RRR Gastrointestinal system: Abdomen is soft, nondistended, mildly tender, unable to appreciate bowel sounds. Central nervous system: A&O x3. no gross focal neurologic deficits, normal speech Skin: dry, intact, normal temperature Psychiatry: normal mood, congruent affect, judgement and insight appear normal   Data Reviewed:  Labs reviewed and notable for potassium 3.5, glucose 110, calcium 8.3.  Abdominal x-ray this morning - Persistent small bowel dilatation greater than and gas/stool in colon consistent with persistent small bowel obstruction.  Family Communication: Wife at bedside on rounds today  Disposition: Status is: Inpatient  Remains inpatient appropriate because: Severity of illness with signs of persistent small bowel obstruction warranting further close monitoring          Time spent: 35 minutes  Author: Ezekiel Slocumb, DO 04/17/2021 2:19 PM  For on call review [email or web address].

## 2021-04-18 DIAGNOSIS — K56609 Unspecified intestinal obstruction, unspecified as to partial versus complete obstruction: Secondary | ICD-10-CM | POA: Diagnosis not present

## 2021-04-18 LAB — BASIC METABOLIC PANEL
Anion gap: 6 (ref 5–15)
BUN: 13 mg/dL (ref 8–23)
CO2: 24 mmol/L (ref 22–32)
Calcium: 8.7 mg/dL — ABNORMAL LOW (ref 8.9–10.3)
Chloride: 105 mmol/L (ref 98–111)
Creatinine, Ser: 0.65 mg/dL (ref 0.61–1.24)
GFR, Estimated: >60 mL/min (ref >60)
Glucose, Bld: 120 mg/dL — ABNORMAL HIGH (ref 70–99)
Potassium: 3.5 mmol/L (ref 3.5–5.1)
Sodium: 135 mmol/L (ref 135–145)

## 2021-04-18 MED ORDER — MAGNESIUM OXIDE -MG SUPPLEMENT 400 (240 MG) MG PO TABS
400.0000 mg | ORAL_TABLET | Freq: Every day | ORAL | Status: DC
  Administered 2021-04-18 – 2021-04-22 (×5): 400 mg via ORAL
  Filled 2021-04-18 (×5): qty 1

## 2021-04-18 MED ORDER — NALOXEGOL OXALATE 25 MG PO TABS
25.0000 mg | ORAL_TABLET | Freq: Every day | ORAL | Status: DC
  Administered 2021-04-18 – 2021-04-22 (×5): 25 mg via ORAL
  Filled 2021-04-18 (×5): qty 1

## 2021-04-18 MED ORDER — DOCUSATE SODIUM 100 MG PO CAPS
300.0000 mg | ORAL_CAPSULE | Freq: Every day | ORAL | Status: DC
  Administered 2021-04-18 – 2021-04-21 (×4): 300 mg via ORAL
  Filled 2021-04-18 (×4): qty 3

## 2021-04-18 MED ORDER — VITAMIN D 25 MCG (1000 UNIT) PO TABS
1000.0000 [IU] | ORAL_TABLET | Freq: Every day | ORAL | Status: DC
  Administered 2021-04-18 – 2021-04-22 (×5): 1000 [IU] via ORAL
  Filled 2021-04-18 (×5): qty 1

## 2021-04-18 NOTE — TOC Progression Note (Signed)
Transition of Care [facility]) - Progression Note    Patient Details  Name: Ronald Santiago MRN: [id number] Date of Birth: 1945-04-09  Transition of Care [facility]) CM/SW [place], LCSW Phone Number: 04/18/2021, 2:55 PM  Clinical Narrative:   private duty list provided.         Expected Discharge Plan and Services                                                 Social Determinants of Health (SDOH) Interventions    Readmission Risk Interventions No flowsheet data found.

## 2021-04-18 NOTE — Progress Notes (Signed)
[facility] SUR[facility]ICAL PROGRESS NOTE (cpt [phone])  Hospital Day(s): 8.   Interval History: Patient seen and examined, no acute events or new complaints overnight. Patient did not DC yesterday secondary to decrease in bowel function and abdominal distension. He continues to pass flatus. No fever, chills, nausea, emesis. BMP is reassuring this morning. He continues to tolerate diet without issues. KUB with dilated loops of small bowel but gas and stool appreciated in colon.   Review of Systems:  Constitutional: denies fever, chills  HEENT: denies cough or congestion  Respiratory: denies any shortness of breath  Cardiovascular: denies chest pain or palpitations  Gastrointestinal: denied abdominal pain N/V Genitourinary: denies burning with urination or urinary frequency Musculoskeletal: denies pain, decreased motor or sensation  Vital signs in last 24 hours: [min-max] current  Temp:  [98 F (36.7 C)-98.6 F (37 C)] 98.1 F (36.7 C) (01/27 0840) Pulse Rate:  [72-80] 72 (01/27 0840) Resp:  [16-19] 18 (01/27 0840) BP: (116-121)/(67-72) 120/68 (01/27 0840) SpO2:  [94 %-97 %] 94 % (01/27 0840)     Height: 6' (182.9 cm) Weight: 90.1 kg BMI (Calculated): 26.94   Intake/Output last 2 shifts:  01/26 0701 - 01/27 0700 In: 763.8 [P.O.:360; I.V.:120.1; IV Piggyback:283.7] Out: 3200 [Urine:3200]   Physical Exam:  Constitutional: alert, cooperative and no distress  HENT: normocephalic without obvious abnormality Eyes: PERRL, EOM's grossly intact and symmetric  Respiratory: Breathing non-labored at rest  Cardiovascular: Regular rate and sinus rhythm  Gastrointestinal: Soft, non-tender this morning, he does not appear distended but still tympanic, no rebound/guarding Genitourinary: Suprapubic foley in place  Labs:  CBC Latest Ref Rng & Units 04/16/2021 04/15/2021 04/14/2021  WBC 4.0 - 10.5 K/uL 6.7 5.3 7.2  Hemoglobin 13.0 - 17.0 g/dL 14.1 12.7(L) 13.5  Hematocrit 39.0 - 52.0 %  41.4 38.2(L) 40.9  Platelets 150 - 400 K/uL 235 205 216   CMP Latest Ref Rng & Units 04/18/2021 04/17/2021 04/16/2021  Glucose 70 - 99 mg/dL 120(H) 110(H) 120(H)  BUN 8 - 23 mg/dL 13 14 13   Creatinine 0.61 - 1.24 mg/dL 0.65 0.61 0.59(L)  Sodium 135 - 145 mmol/L 135 136 134(L)  Potassium 3.5 - 5.1 mmol/L 3.5 3.5 3.6  Chloride 98 - 111 mmol/L 105 105 108  CO2 22 - 32 mmol/L 24 24 20(L)  Calcium 8.9 - 10.3 mg/dL 8.7(L) 8.3(L) 8.1(L)  Total Protein 6.5 - 8.1 g/dL - - -  Total Bilirubin 0.3 - 1.2 mg/dL - - -  Alkaline Phos 38 - 126 U/L - - -  AST 15 - 41 U/L - - -  ALT 0 - 44 U/L - - -    Imaging studies:   KUB (04/17/2021) personally reviewed with persistent dilated loops of bowel, stool and gas in colon, and radiologist report reviewed below:  IMPRESSION: Persistent small bowel dilatation greater than and gas/stool in colon consistent with persistent small bowel obstruction.   Assessment/Plan: (ICD-10's: K27.609) 77 y.o. male with clinically resolved probable small bowel obstruction -vs- ileus in setting of UTI -vs- chronic constipation -vs- complication of MS   - Although he continues to have dilated bowel on KUB there is air and stool in colon and he continues to pass gas. Likely, his XR findings are lagging behind his clinical picture. His home medications have been held and previous to admission his normal bowel movements can occur every 2-4 days at his baseline. I think it is reasonable to resume his home bowel regimen. He continues to pass flatus so  a true obstruction is unlikely.               - Okay to continue soft/regular diet             - Monitor abdominal examination; on-going bowel function - Pain control prn; antiemetics prn             - Further management per primary service   - Discharge planning: Okay for discharge from surgical standpoint. He does NOT need to follow up with surgery.   All of the above findings and recommendations were discussed with the patient,  patient's family (wife), and the medical team, and all of patient's and family's questions were answered to their expressed satisfaction.  -- Edison Simon, PA-C [facility] 04/18/2021, 9:05 AM [phone] M-F: 7am - 4pm

## 2021-04-18 NOTE — Progress Notes (Signed)
Progress Note   Patient: Ronald Santiago [id number] DOB: Dec 29, 1945 DOA: 04/10/2021     8 DOS: the patient was seen and examined on 04/18/2021   Brief hospital course: Ronald Santiago is a 76 y.o. 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  He has a right ischial large decubitus ulcer that is cared for by home health nurse.  He admitted to fever of 102 on Tuesday and chills.  No fever on the day of admission. In the ED,  VSS, labs showed BUN of 41, creatinine 1.56 previously normal, UA was positive for UTI.  CT abdomen/pelvis showed SBO.  General surgery consulted.  Patient admitted for further management.  Assessment and Plan * SBO (small bowel obstruction) ([place])- (present on admission) Overall improving.  NG tube removed and patient's been resumed on diet and overall tolerated. 1/26: Patient reports increased abdominal discomfort and distention.  Passed minimal amount of gas overnight and no stools since yesterday. 1/27: still no BM's, only minimal gas, abdomen more distended today.  Xray yesterday with signs of persistent SBO --General surgery following --S/p NG tube, removed on 04/14/2021 --Tolerating soft diet. --Metamucil for fiber supplement and stool bulking. --Pain management as needed --Monitor hydration and electrolytes  --Resume home meds for constipation related to MS --Monitor for return of bowel function / BM's  Acute lower UTI- (present on admission) POA. Urine culture growing Proteus vulagris resistant to Cipro, sensitive to Cefepime and imipenem.   --Continue Cefepime (started 1/22), completes course today  AKI (acute kidney injury) ([place])- (present on admission) POA, resolved with IV hydration. Monitor BMP.  Hypophosphatemia- (present on admission) Resolved  with replacement. Replace phos as needed.  Multiple sclerosis, primary progressive ([place])- (present on admission) Continue baclofen, Ditropan, Movantik, gabapentin, Klonopin, oxycodone PRN  Stage IV decubitus ulcer ([place])- (present on admission) No signs of infection Wound care Frequent turning every 2 hours     Subjective: Patient awake laying in bed when seen today.  Wife had left the room to go get her own medications.  Patient reports still only passing a little bit of gas but no bowel movement since day before yesterday.  States that his abdomen is more distended today.  He denies nausea or vomiting however.  He and wife concerned about the lack of caregiver support at home and patient's wife has her own health issues, not currently able to provide adequate care for the patient given he is far weaker than his baseline.  Objective Vitals reviewed and unremarkable  General exam: awake, alert, no acute distress HEENT: atraumatic, clear conjunctiva, anicteric sclera, moist mucus membranes, hearing grossly normal  Respiratory system: normal respiratory effort, on room air. Cardiovascular system: normal S1/S2, RRRno pedal edema.   Gastrointestinal system: Abdomen is distended and mildly tender on palpation, unable to appreciate bowel sounds on auscultation. Central nervous system: A&O x4. no gross focal neurologic deficits, normal speech Extremities: Hypertonicity in upper extremities noted, no peripheral edema Skin: dry, intact, normal temperature Psychiatry: normal mood, congruent affect, judgement and insight appear normal   Data Reviewed:  Labs reviewed and notable for glucose 120, calcium 8.7.  Abdominal x-ray yesterday showed Persistent small bowel dilatation greater than and gas/stool in colon consistent with persistent small bowel obstruction  Family Communication: Wife was not at bedside during my encounter today.  She is  spent at bedside the past 2 days on my rounds and  updated on plan of care.  Disposition: Status is: Inpatient  Remains inpatient appropriate because: Persistent SBO with increased abdominal distention and no bowel movements since 2 days ago.         Time spent: 35 minutes  Author: Ezekiel Slocumb, DO 04/18/2021 3:50 PM  For on call review [email or web address].

## 2021-04-19 DIAGNOSIS — K56609 Unspecified intestinal obstruction, unspecified as to partial versus complete obstruction: Secondary | ICD-10-CM | POA: Diagnosis not present

## 2021-04-19 LAB — BASIC METABOLIC PANEL
Anion gap: 7 (ref 5–15)
BUN: 14 mg/dL (ref 8–23)
CO2: 26 mmol/L (ref 22–32)
Calcium: 8.7 mg/dL — ABNORMAL LOW (ref 8.9–10.3)
Chloride: 103 mmol/L (ref 98–111)
Creatinine, Ser: 0.68 mg/dL (ref 0.61–1.24)
GFR, Estimated: >60 mL/min (ref >60)
Glucose, Bld: 111 mg/dL — ABNORMAL HIGH (ref 70–99)
Potassium: 3.5 mmol/L (ref 3.5–5.1)
Sodium: 136 mmol/L (ref 135–145)

## 2021-04-19 LAB — PHOSPHORUS: Phosphorus: 2.7 mg/dL (ref 2.5–4.6)

## 2021-04-19 MED ORDER — PANTOPRAZOLE SODIUM 40 MG PO TBEC
40.0000 mg | DELAYED_RELEASE_TABLET | Freq: Two times a day (BID) | ORAL | Status: DC
  Administered 2021-04-19 – 2021-04-22 (×6): 40 mg via ORAL
  Filled 2021-04-19 (×6): qty 1

## 2021-04-19 MED ORDER — CLONAZEPAM 1 MG PO TABS
1.0000 mg | ORAL_TABLET | Freq: Every day | ORAL | Status: DC
  Administered 2021-04-19 – 2021-04-21 (×3): 1 mg via ORAL
  Filled 2021-04-19 (×3): qty 1

## 2021-04-19 NOTE — Progress Notes (Signed)
PHARMACIST - PHYSICIAN COMMUNICATION  CONCERNING: IV to Oral Route Change Policy  RECOMMENDATION: This patient is receiving pantoprazole by the intravenous route.  Based on criteria approved by the Pharmacy and Therapeutics Committee, the intravenous medication(s) is/are being converted to the equivalent oral dose form(s).   DESCRIPTION: These criteria include: The patient is eating (either orally or via tube) and/or has been taking other orally administered medications for a least 24 hours The patient has no evidence of active gastrointestinal bleeding or impaired GI absorption (gastrectomy, short bowel, patient on TNA or NPO).  If you have questions about this conversion, please contact the Pharmacy Department   Tressie Ellis, [facility] 04/19/2021 8:37 AM

## 2021-04-19 NOTE — Progress Notes (Signed)
Progress Note   Patient: Ronald Santiago [id number] DOB: Nov 30, 1945 DOA: 04/10/2021     9 DOS: the patient was seen and examined on 04/19/2021   Brief hospital course: Ronald Santiago is a 76 y.o. 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  He has a right ischial large decubitus ulcer that is cared for by home health nurse.  He admitted to fever of 102 on Tuesday and chills.  No fever on the day of admission. In the ED,  VSS, labs showed BUN of 41, creatinine 1.56 previously normal, UA was positive for UTI.  CT abdomen/pelvis showed SBO.  General surgery consulted.  Patient admitted for further management.  Assessment and Plan * SBO (small bowel obstruction) ([place])- (present on admission) Overall improving.  NG tube removed and patient's been resumed on diet and overall tolerated. 1/26: Patient reports increased abdominal discomfort and distention.  Passed minimal amount of gas overnight and no stools since yesterday. 1/27: still no BM's, only minimal gas, abdomen more distended today.  Xray yesterday with signs of persistent SBO 1/28: Patient and wife report very small volume BM overnight --General surgery following --S/p NG tube, removed on 04/14/2021 --Tolerating soft diet, advance to regular. --Metamucil for fiber supplement and stool bulking. --Pain management as needed --Monitor hydration and electrolytes  --Resume home meds for constipation related to MS -- Monitor abdominal exam and bowel activity  Acute lower UTI- (present on admission) POA. Urine culture growing Proteus vulagris resistant to Cipro, sensitive to Cefepime and imipenem.   -- Completed course of cefepime -- Monitor clinically  AKI (acute kidney injury) ([place])- (present on admission) POA, resolved with IV  hydration. Monitor BMP.  Hypophosphatemia- (present on admission) Resolved with replacement. Replace phos as needed.  Multiple sclerosis, primary progressive ([place])- (present on admission) Continue baclofen, Ditropan, Movantik, gabapentin, Klonopin, oxycodone PRN  Stage IV decubitus ulcer ([place])- (present on admission) No signs of infection Wound care Frequent turning every 2 hours -- New hospital bed with undulating mattress ordered.  Patient and wife report improvement in his wound since admission and think his undulating mattress at home is not functioning correctly     Subjective: Patient seen with wife at bedside.  They report he had very small volume BM overnight.  Abdomen feels a little less distended today but continues to be uncomfortable most of the time, bloated feeling.  They report the patient's sacral wound has improved a lot since admission and attribute this to our hospital bed and mattress.  He does have an undulating mattress at home they feel may not be functioning properly.  They are looking for private caregiver help for when he returns home as patient's wife is currently not able to provide adequate assistance given he is far weaker than his baseline.  Otherwise patient reports inability to sleep and requests a sleep medicine be scheduled.  Objective Vital signs reviewed and notable for soft blood pressure this morning 107/69 otherwise vitals unremarkable  General exam: awake, alert, no acute distress HEENT: atraumatic, clear conjunctiva, anicteric sclera, moist mucus membranes, hearing grossly normal  Respiratory system: normal respiratory effort, on room air. Cardiovascular system: RRR, no pedal edema.   Gastrointestinal system: Mildly distended, softer than yesterday, nontender, unable to appreciate bowel sounds. Central nervous system: A&O x4. no gross focal neurologic deficits, normal speech  Extremities: Left hand spasticity noted and appears stable, no  edema Skin: dry, intact, normal temperature Psychiatry: normal mood, congruent affect, judgement and insight appear normal   Data Reviewed:  Labs reviewed and notable for glucose 111, calcium 8.7  Family Communication: Wife at bedside on rounds today  Disposition: Status is: Inpatient  Remains inpatient appropriate because: Unsafe discharge until caregiver assistance available at home.  Patient and wife are pursuing private caregiver support.  Patient is currently unable to stand for transfers which he can typically do mostly independently.   Patient's wife will require additional help with providing adequate care at home.        Time spent: 35 minutes  Author: Ezekiel Slocumb, DO 04/19/2021 3:47 PM  For on call review [email or web address].

## 2021-04-19 NOTE — Progress Notes (Signed)
Occupational Therapy Treatment Patient Details Name: Ronald Santiago MRN: [id number] DOB: Nov 11, 1945 Today's Date: 04/19/2021   History of present illness Ronald Santiago is a 76 y.o. Caucasian male with medical history significant for primary progressive multiple sclerosis, R ischial decubitus ulcer, and chronic kidney disease, who presented to the ER with acute onset of recurrent nausea and vomiting and abdominal pain. Surgery consulted for probable small bowel obstruction -vs- ileus in setting of UTI -vs- chronic constipation -vs- complication of MS. NGT placed, now d/c'ed. Pt admitted for further mgt.   OT comments  Pt seen for OT tx this date to f/u re: ADL indep and strength as it pertains to ADL mobility. OT engages pt in bed level core strengthening modified crunch within tolerance x10 (1 sec hold each, cues to control eccentric portion of exercise as well). OT engages pt in UB bathing with SETUP to MIN A, and LB bathing with lateral rolling (MOD A +2 for equipment) and MAX A For peri care. Pt left with all needs met and in reach, Spouse positioning blankets as pt requests and RN having just replaced wound dressing to gluteal region as it was soiled and removed by OT. Will continue to follow acutely. Continue to anticipate that pt will require f/u HHOT to support spouse in maximizing pt indep with ADL routines.    Recommendations for follow up therapy are one component of a multi-disciplinary discharge planning process, led by the attending physician.  Recommendations may be updated based on patient status, additional functional criteria and insurance authorization.    Follow Up Recommendations  Home health OT    Assistance Recommended at Discharge Frequent or constant Supervision/Assistance  Patient can return home with the following  Two people to help with walking and/or transfers;A lot of help with bathing/dressing/bathroom;Assistance with cooking/housework;Assistance with feeding;Assist for  transportation;Help with stairs or ramp for entrance   Equipment Recommendations  None recommended by OT (has necessary equipment at home, but reports his air bed does not protect his skin as well as the hospital's size-wise. CM aware)    Recommendations for Other Services      Precautions / Restrictions Precautions Precautions: Fall Restrictions Weight Bearing Restrictions: No Other Position/Activity Restrictions: suprapubic catheter       Mobility Bed Mobility Overal bed mobility: Needs Assistance Bed Mobility: Rolling Rolling: Mod assist, +2 for safety/equipment              Transfers                   General transfer comment: deferred     Balance                                           ADL either performed or assessed with clinical judgement   ADL Overall ADL's : Needs assistance/impaired     Grooming: Wash/dry face;Set up;Bed level   Upper Body Bathing: Set up;Minimal assistance;Bed level Upper Body Bathing Details (indicate cue type and reason): high fowler's Lower Body Bathing: Moderate assistance;Maximal assistance;Bed level Lower Body Bathing Details (indicate cue type and reason): lateral rolling technique in bed Upper Body Dressing : Set up;Bed level Upper Body Dressing Details (indicate cue type and reason): high fowler's         Toileting- Clothing Manipulation and Hygiene: Maximal assistance;Bed level Toileting - Clothing Manipulation Details (indicate cue type and reason): rolling technique-MAX A for  thorough peri care            Extremity/Trunk Assessment              Vision       Perception     Praxis      Cognition Arousal/Alertness: Awake/alert Behavior During Therapy: WFL for tasks assessed/performed Overall Cognitive Status: Within Functional Limits for tasks assessed                                          Exercises Other Exercises Other Exercises: OT engages pt in  bed level core strengthening modified crunch within tolerance x10 (1 sec hold each, cues to control eccentric portion of exercise as well). OT engages pt in UB bathing with SETUP to MIN A, and LB bathing with lateral rolling (MOD A +2 for equipment) and MAX A For peri care. Pt left with all needs met and in reach, Spouse positioning blankets as pt requests and RN having just replaced wound dressing to gluteal region as it was soiled and removed by OT.    Shoulder Instructions       General Comments      Pertinent Vitals/ Pain       Pain Assessment Pain Assessment: 0-10 Pain Score: 4  Pain Location: R lower glute wound Pain Descriptors / Indicators: Constant, Discomfort Pain Intervention(s): Limited activity within patient's tolerance, Monitored during session, Repositioned  Home Living                                          Prior Functioning/Environment              Frequency  Min 2X/week        Progress Toward Goals  OT Goals(current goals can now be found in the care plan section)  Progress towards OT goals: Progressing toward goals  Acute Rehab OT Goals Patient Stated Goal: to get my strength back OT Goal Formulation: With patient Time For Goal Achievement: 04/29/21 Potential to Achieve Goals: Good  Plan Discharge plan remains appropriate    Co-evaluation                 AM-PAC OT "6 Clicks" Daily Activity     Outcome Measure   Help from another person eating meals?: A Little Help from another person taking care of personal grooming?: A Little Help from another person toileting, which includes using toliet, bedpan, or urinal?: A Lot Help from another person bathing (including washing, rinsing, drying)?: A Lot Help from another person to put on and taking off regular upper body clothing?: A Lot Help from another person to put on and taking off regular lower body clothing?: A Lot 6 Click Score: 14    End of Session    OT Visit  Diagnosis: Other abnormalities of gait and mobility (R26.89);Other symptoms and signs involving the nervous system (R29.898);Muscle weakness (generalized) (M62.81)   Activity Tolerance Patient limited by fatigue;Patient limited by pain   Patient Left in bed;with call bell/phone within reach;with bed alarm set;with family/visitor present   Nurse Communication Mobility status        Time: 1208-1300 OT Time Calculation (min): 52 min  Charges: OT General Charges $OT Visit: 1 Visit OT Treatments $Self Care/Home Management : 23-37 mins $Therapeutic Activity: 8-22 mins $  Therapeutic Exercise: 8-22 mins  Gerrianne Scale, [place], OTR/L ascom [phone] 04/19/21, 6:04 PM

## 2021-04-19 NOTE — Progress Notes (Signed)
[facility] SURGICAL PROGRESS NOTE (cpt [phone])  Hospital Day(s): 9.   Interval History: Patient seen and examined, no acute events or new complaints overnight.  He continues to pass flatus.  Minimal liquid stool output.  No fever, chills, nausea, emesis.  He continues to tolerate diet without issues.    Review of Systems:  Constitutional: denies fever, chills  HEENT: denies cough or congestion  Respiratory: denies any shortness of breath  Cardiovascular: denies chest pain or palpitations  Gastrointestinal: denied abdominal pain N/V Genitourinary: denies burning with urination or urinary frequency Musculoskeletal: denies pain, decreased motor or sensation  Vital signs in last 24 hours: [min-max] current  Temp:  [98 F (36.7 C)-98.9 F (37.2 C)] 98 F (36.7 C) (01/28 0757) Pulse Rate:  [67-75] 75 (01/28 0757) Resp:  [16-20] 18 (01/28 0757) BP: (105-115)/(69-74) 113/73 (01/28 0757) SpO2:  [93 %-97 %] 97 % (01/28 0757)     Height: 6' (182.9 cm) Weight: 90.1 kg BMI (Calculated): 26.94   Intake/Output last 2 shifts:  01/27 0701 - 01/28 0700 In: -  Out: 1500 [Urine:1500]   Physical Exam:  Constitutional: alert, cooperative and no distress  HENT: normocephalic without obvious abnormality Eyes: PERRL, EOM's grossly intact and symmetric  Respiratory: Breathing non-labored at rest  Cardiovascular: Regular rate and sinus rhythm  Gastrointestinal: Soft, non-tender this morning, he does not appear distended, no rebound/guarding Genitourinary: Suprapubic foley in place  Labs:  CBC Latest Ref Rng & Units 04/16/2021 04/15/2021 04/14/2021  WBC 4.0 - 10.5 K/uL 6.7 5.3 7.2  Hemoglobin 13.0 - 17.0 g/dL 14.1 12.7(L) 13.5  Hematocrit 39.0 - 52.0 % 41.4 38.2(L) 40.9  Platelets 150 - 400 K/uL 235 205 216   CMP Latest Ref Rng & Units 04/19/2021 04/18/2021 04/17/2021  Glucose 70 - 99 mg/dL 111(H) 120(H) 110(H)  BUN 8 - 23 mg/dL 14 13 14   Creatinine 0.61 - 1.24 mg/dL 0.68 0.65 0.61   Sodium 135 - 145 mmol/L 136 135 136  Potassium 3.5 - 5.1 mmol/L 3.5 3.5 3.5  Chloride 98 - 111 mmol/L 103 105 105  CO2 22 - 32 mmol/L 26 24 24   Calcium 8.9 - 10.3 mg/dL 8.7(L) 8.7(L) 8.3(L)  Total Protein 6.5 - 8.1 g/dL - - -  Total Bilirubin 0.3 - 1.2 mg/dL - - -  Alkaline Phos 38 - 126 U/L - - -  AST 15 - 41 U/L - - -  ALT 0 - 44 U/L - - -    Imaging studies:   KUB (04/17/2021) personally reviewed with persistent dilated loops of bowel, stool and gas in colon, and radiologist report reviewed below:  IMPRESSION: Persistent small bowel dilatation greater than and gas/stool in colon consistent with persistent small bowel obstruction.   Assessment/Plan: (ICD-10's: K61.609) 76 y.o. male with clinically resolved probable small bowel obstruction -vs- ileus in setting of UTI -vs- chronic constipation -vs- complication of MS   - Although he continues to have dilated bowel on KUB there is air and stool in colon and he continues to pass gas. Likely, his XR findings are lagging behind his clinical picture. His home medications have been held and previous to admission his normal bowel movements can occur every 2-4 days at his baseline. I think it is reasonable to resume his home bowel regimen. He continues to pass flatus so a true obstruction is unlikely.               - Okay to continue soft/regular diet             -  Monitor abdominal examination; on-going bowel function - Pain control prn; antiemetics prn             - Further management per primary service   - Discharge planning: Okay for discharge from surgical standpoint. He does NOT need to follow up with surgery.   All of the above findings and recommendations were discussed with the patient, patient's family (wife), and the medical team, and all of patient's and family's questions were answered to their expressed satisfaction.  --Ronny Bacon, M.D., [facility]  04/19/2021 ; 4:08 PM

## 2021-04-20 DIAGNOSIS — Z742 Need for assistance at home and no other household member able to render care: Secondary | ICD-10-CM | POA: Diagnosis not present

## 2021-04-20 DIAGNOSIS — N179 Acute kidney failure, unspecified: Secondary | ICD-10-CM | POA: Diagnosis not present

## 2021-04-20 DIAGNOSIS — G35 Multiple sclerosis: Secondary | ICD-10-CM | POA: Diagnosis not present

## 2021-04-20 DIAGNOSIS — K56609 Unspecified intestinal obstruction, unspecified as to partial versus complete obstruction: Secondary | ICD-10-CM | POA: Diagnosis not present

## 2021-04-20 MED ORDER — BISACODYL 5 MG PO TBEC
10.0000 mg | DELAYED_RELEASE_TABLET | Freq: Once | ORAL | Status: AC
  Administered 2021-04-20: 10 mg via ORAL
  Filled 2021-04-20: qty 2

## 2021-04-20 MED ORDER — OXYCODONE HCL 5 MG PO TABS
5.0000 mg | ORAL_TABLET | Freq: Once | ORAL | Status: AC
  Administered 2021-04-20: 5 mg via ORAL
  Filled 2021-04-20: qty 1

## 2021-04-20 MED ORDER — OXYCODONE HCL 5 MG PO TABS
10.0000 mg | ORAL_TABLET | Freq: Four times a day (QID) | ORAL | Status: DC | PRN
  Administered 2021-04-20 – 2021-04-22 (×4): 10 mg via ORAL
  Filled 2021-04-20 (×4): qty 2

## 2021-04-20 NOTE — Progress Notes (Signed)
Progress Note   Patient: Ronald Santiago [id number] DOB: February 02, 1946 DOA: 04/10/2021     10 DOS: the patient was seen and examined on 04/20/2021   Brief hospital course: Ronald Santiago is a 76 y.o. 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  He has a right ischial large decubitus ulcer that is cared for by home health nurse.  He admitted to fever of 102 on Tuesday and chills.  No fever on the day of admission. In the ED,  VSS, labs showed BUN of 41, creatinine 1.56 previously normal, UA was positive for UTI.  CT abdomen/pelvis showed SBO.  General surgery consulted.  Patient admitted for further management.  Assessment and Plan * SBO (small bowel obstruction) ([place])- (present on admission) Overall improving.  NG tube removed and patient's been resumed on diet and overall tolerated. 1/26: Patient reports increased abdominal discomfort and distention.  Passed minimal amount of gas overnight and no stools since yesterday. 1/27: still no BM's, only minimal gas, abdomen more distended today.  Xray yesterday with signs of persistent SBO 1/28: Patient and wife report very small volume BM overnight --General surgery following --S/p NG tube, removed on 04/14/2021 --Tolerating soft diet, advance to regular. --Metamucil for fiber supplement and stool bulking. --Pain management as needed --Monitor hydration and electrolytes  --Resume home meds for constipation related to MS -- Monitor abdominal exam and bowel activity  Acute lower UTI- (present on admission) POA. Urine culture growing Proteus vulagris resistant to Cipro, sensitive to Cefepime and imipenem.   -- Completed course of cefepime -- Monitor clinically  AKI (acute kidney injury) ([place])- (present on admission) POA, resolved with IV  hydration. Monitor BMP.  Multiple sclerosis, primary progressive ([place])- (present on admission) Continue baclofen, Ditropan, Movantik, gabapentin, Klonopin, oxycodone PRN  Stage IV decubitus ulcer ([place])- (present on admission) No signs of infection Wound care Frequent turning every 2 hours -- New hospital bed with undulating mattress ordered.  Patient and wife report improvement in his wound since admission and think his undulating mattress at home is not functioning correctly  Hypophosphatemia- (present on admission) Resolved with replacement. Replace phos as needed.   Subjective: pt seen at bedside with wife. Pt eating breakfast. Pt passing flatus. No good BM in 5 days.  Objective BP 115/80 (BP Location: Right Arm)    Pulse 69    Temp 98.4 F (36.9 C) (Oral)    Resp 18    Ht 6' (1.829 m)    Wt 90.1 kg    SpO2 96%    BMI 26.95 kg/m    Physical Exam Vitals and nursing note reviewed.  Constitutional:      General: He is not in acute distress.    Appearance: Normal appearance. He is not toxic-appearing or diaphoretic.     Comments: Chronically ill appearing  HENT:     Head: Normocephalic and atraumatic.     Nose: Nose normal.  Cardiovascular:     Rate and Rhythm: Normal rate and regular rhythm.  Pulmonary:     Effort: Pulmonary effort is normal. No respiratory distress.     Breath sounds: No wheezing or rales.  Abdominal:     General: Bowel sounds are normal. There is no distension.     Tenderness: There is no abdominal tenderness.  Musculoskeletal:     Right  lower leg: Edema present.     Left lower leg: Edema present.     Comments: Trace pedal edema  Skin:    Capillary Refill: Capillary refill takes less than 2 seconds.  Neurological:     Mental Status: He is alert and oriented to person, place, and time.     Comments: Some spastic paresis of both hands      Data Reviewed: BMP Latest Ref Rng & Units 04/19/2021 04/18/2021 04/17/2021  Glucose 70 - 99 mg/dL 111(H)  120(H) 110(H)  BUN 8 - 23 mg/dL 14 13 14   Creatinine 0.61 - 1.24 mg/dL 0.68 0.65 0.61  Sodium 135 - 145 mmol/L 136 135 136  Potassium 3.5 - 5.1 mmol/L 3.5 3.5 3.5  Chloride 98 - 111 mmol/L 103 105 105  CO2 22 - 32 mmol/L 26 24 24   Calcium 8.9 - 10.3 mg/dL 8.7(L) 8.7(L) 8.3(L)   CBC Latest Ref Rng & Units 04/16/2021 04/15/2021 04/14/2021  WBC 4.0 - 10.5 K/uL 6.7 5.3 7.2  Hemoglobin 13.0 - 17.0 g/dL 14.1 12.7(L) 13.5  Hematocrit 39.0 - 52.0 % 41.4 38.2(L) 40.9  Platelets 150 - 400 K/uL 235 205 216   Family Communication: Wife at bedside on rounds today  Disposition: Status is: Inpatient  Remains inpatient appropriate because: Unsafe discharge until caregiver assistance available at home.  Patient and wife are pursuing private caregiver support.  Patient is currently unable to stand for transfers which he can typically do mostly independently.   Patient's wife will require additional help with providing adequate care at home.  Time spent: 35 minutes  Author: Kristopher Oppenheim, DO 04/20/2021 11:51 AM  For on call review [email or web address].

## 2021-04-20 NOTE — Assessment & Plan Note (Addendum)
1/30: home health agency at bedside with patient and wife, will be available once pt discharged home as soon as tomorrow, pending delivery of DME to the home including hoyer lift.

## 2021-04-20 NOTE — Evaluation (Signed)
Physical Therapy Evaluation Patient Details Name: Ronald Santiago MRN: [id number] DOB: 09-Feb-1946 Today's Date: 04/20/2021  History of Present Illness  Ronald Santiago is a 76 y.o. Caucasian male with medical history significant for primary progressive multiple sclerosis, R ischial decubitus ulcer, and chronic kidney disease, who presented to the ER with acute onset of recurrent nausea and vomiting and abdominal pain. Surgery consulted for probable small bowel obstruction -vs- ileus in setting of UTI -vs- chronic constipation -vs- complication of MS. NGT placed, now d/c'ed. Pt admitted for further mgt.Patient requested d/c from physical therapy services on 04/15/21 but physician requests patient to be re-assessed.   Clinical Impression  Patient recently requested self d/c from physical therapy but due to weakness has been returned to caseload. Prior to hospital admission, pt was power wheelchair bound and lives with wife. He requires assistance at baseline for transfers and ADLs.  Currently pt is limited to bed as he declines EOB mobility.  Patient educated on supine strengthening interventions and given educational package.  Patient's wife is present and verbalized understanding for strengthening interventions. Patient will benefit from additional therapy for strengthening and may need use of a hoyer lift if unable to reach EOB. Pt would benefit from skilled PT to address noted impairments and functional limitations (see below for any additional details).  Upon hospital discharge, pt would benefit from HHPT and an aide due to weakness.        Recommendations for follow up therapy are one component of a multi-disciplinary discharge planning process, led by the attending physician.  Recommendations may be updated based on patient status, additional functional criteria and insurance authorization.  Follow Up Recommendations Home health PT    Assistance Recommended at Discharge Frequent or constant  Supervision/Assistance  Patient can return home with the following  Two people to help with walking and/or transfers;Two people to help with bathing/dressing/bathroom;Direct supervision/assist for medications management;Assistance with feeding;Direct supervision/assist for financial management;Assist for transportation    Equipment Recommendations  (patient may need hoyer lift)  Recommendations for Other Services       Functional Status Assessment Patient has had a recent decline in their functional status and demonstrates the ability to make significant improvements in function in a reasonable and predictable amount of time.     Precautions / Restrictions Precautions Precautions: Fall Restrictions Weight Bearing Restrictions: No Other Position/Activity Restrictions: suprapubic catheter      Mobility  Bed Mobility               General bed mobility comments: Deferred. Pt declines 2/2 R hip wound pain.    Transfers                   General transfer comment: deferred    Ambulation/Gait               General Gait Details: non ambulatory  Stairs            Wheelchair Mobility    Modified Rankin (Stroke Patients Only)       Balance Overall balance assessment: Needs assistance     Sitting balance - Comments: unable to assess this session                                     Pertinent Vitals/Pain Pain Assessment Pain Assessment: 0-10 Pain Score: 4  Pain Location: R lower glute wound Pain Descriptors / Indicators: Constant, Discomfort Pain Intervention(s): Limited  activity within patient's tolerance, Repositioned    Home Living Family/patient expects to be discharged to:: Private residence Living Arrangements: Spouse/significant other Available Help at Discharge: Family;Available 24 hours/day Type of Home: House Home Access: Ramped entrance       Home Layout: One level Home Equipment: Wheelchair - power;Tub  bench;Hand held shower head;Grab bars - tub/shower;Grab bars - toilet Additional Comments: Multiple grab bars placed t/o the home to facilitate t/fs.    Prior Function Prior Level of Function : Needs assist       Physical Assist : ADLs (physical);Mobility (physical) Mobility (physical): Transfers ADLs (physical): IADLs;Toileting;Dressing;Bathing;Grooming;Feeding Mobility Comments: t/fs to Power [facility] for functional mobility. Generally requires only supervision for t/f to/from power WC from bed, commodde, etc. ADLs Comments: Up until ~ 1 week PTA, pt was able to complete grooming independently, taking a weekly shower with assist from spouse to t/f in/out but no physical assist for bathing. Generally limited with amt of time in WC 2/2 pressure ulcer.     Hand Dominance   Dominant Hand: Left    Extremity/Trunk Assessment   Upper Extremity Assessment Upper Extremity Assessment: Defer to OT evaluation RUE Deficits / Details: Active shoulder flexion/elbow extension against gravity. Grossly 2+ to 3/5. Grip notably diminished 2/5. LUE Deficits / Details: Improved functional strength in LUE (baseline per pt). Has adapted utensils, etc.    Lower Extremity Assessment Lower Extremity Assessment: Generalized weakness (LLE weaker than RLE; unable to fully assess due to limited mobility)    Cervical / Trunk Assessment Cervical / Trunk Assessment: Normal  Communication   Communication: No difficulties  Cognition Arousal/Alertness: Awake/alert Behavior During Therapy: WFL for tasks assessed/performed Overall Cognitive Status: Within Functional Limits for tasks assessed                                          General Comments General comments (skin integrity, edema, etc.): buttocks wound    Exercises General Exercises - Lower Extremity Ankle Circles/Pumps: Strengthening, Both, 15 reps, Supine Quad Sets: Strengthening, Both, 15 reps, Supine Gluteal Sets: Strengthening,  Both, 15 reps, Supine Heel Slides: AAROM, Strengthening, Both, 15 reps, Supine Hip ABduction/ADduction: AAROM, Strengthening, Both, 15 reps, Supine Other Exercises Other Exercises: Patient and patient's wife educated on role of PT in acute care setting, bed mobility and strengthening interventions.   Assessment/Plan    PT Assessment Patient needs continued PT services  PT Problem List Decreased strength;Decreased range of motion;Decreased activity tolerance;Decreased mobility;Impaired sensation;Decreased skin integrity;Pain       PT Treatment Interventions Functional mobility training;Therapeutic activities;Patient/family education;Neuromuscular re-education;Balance training;Therapeutic exercise;Modalities;Manual techniques    PT Goals (Current goals can be found in the Care Plan section)  Acute Rehab PT Goals Patient Stated Goal: to get some strength back to return home PT Goal Formulation: With patient Time For Goal Achievement: 05/04/21 Potential to Achieve Goals: Fair    Frequency Min 2X/week     Co-evaluation               AM-PAC PT "6 Clicks" Mobility  Outcome Measure Help needed turning from your back to your side while in a flat bed without using bedrails?: Total Help needed moving from lying on your back to sitting on the side of a flat bed without using bedrails?: Total Help needed moving to and from a bed to a chair (including a wheelchair)?: Total Help needed standing up from a chair  using your arms (e.g., wheelchair or bedside chair)?: Total Help needed to walk in hospital room?: Total Help needed climbing 3-5 steps with a railing? : Total 6 Click Score: 6    End of Session Equipment Utilized During Treatment: Gait belt Activity Tolerance: Patient tolerated treatment well;Patient limited by fatigue Patient left: in bed;with call bell/phone within reach;with family/visitor present Nurse Communication: Mobility status PT Visit Diagnosis: Muscle weakness  (generalized) (M62.81);Pain Pain - Right/Left:  (bilateral) Pain - part of body: Leg    Time: 2409-7353 PT Time Calculation (min) (ACUTE ONLY): 24 min   Charges:   PT Evaluation $PT Eval Moderate Complexity: 1 Mod PT Treatments $Therapeutic Exercise: 8-22 mins        Precious Bard, PT, DPT  04/20/2021, 1:44 PM

## 2021-04-21 DIAGNOSIS — K56609 Unspecified intestinal obstruction, unspecified as to partial versus complete obstruction: Secondary | ICD-10-CM | POA: Diagnosis not present

## 2021-04-21 NOTE — TOC Progression Note (Signed)
Transition of Care [facility]) - Progression Note    Patient Details  Name: Ronald Santiago MRN: [id number] Date of Birth: 1945-07-02  Transition of Care [facility]) CM/SW [place], LCSW Phone Number: 04/21/2021, 3:53 PM  Clinical Narrative:   Wife declined hospital bed but still wants air mattress. Patient already has an electric bed that he can operate with a remote control rather than using a hospital bed he has to crank. Wife also agreeable to hoyer lift and is requesting trapeze bar. MD will order. Plan for DME delivery in the morning, per wife. Will need EMS transport home. Address on facesheet is correct. [facility] is aware of plan for discharge tomorrow.  Expected Discharge Plan and Services                           DME Arranged: Hospital bed, Specialty mattress (Alternating air mattress) DME Agency: [employer] Date DME Agency Contacted: 04/19/21 Time DME Agency Contacted: [phone] Representative spoke with at DME Agency: [facility] ([place]) Interventions    Readmission Risk Interventions No flowsheet data found.

## 2021-04-21 NOTE — Progress Notes (Signed)
Occupational Therapy Treatment Patient Details Name: Ronald Santiago MRN: [id number] DOB: April 10, 1945 Today's Date: 04/21/2021   History of present illness Ronald Santiago is a 76 y.o. Caucasian male with medical history significant for primary progressive multiple sclerosis, R ischial decubitus ulcer, and chronic kidney disease, who presented to the ER with acute onset of recurrent nausea and vomiting and abdominal pain. Surgery consulted for probable small bowel obstruction -vs- ileus in setting of UTI -vs- chronic constipation -vs- complication of MS. NGT placed, now d/c'ed. Pt admitted for further mgt.Patient requested d/c from physical therapy services on 04/15/21 but physician requests patient to be re-assessed.   OT comments  Ronald Santiago was seen for OT treatment on this date. Upon arrival to room pt reclined in bed, family at bedside, pt agreeable to tx. Pt completed bed level BUE therex using red band (provided) as described below. Pt requires MOD A sup<>sit and MIN A R rolling. CGA static sitting EOB. MIN A bed level UB grooming. Pt making good progress toward goals. Pt continues to benefit from skilled OT services to maximize return to PLOF and minimize risk of future falls, injury, caregiver burden, and readmission. Will continue to follow POC. Discharge recommendation remains appropriate.     Recommendations for follow up therapy are one component of a multi-disciplinary discharge planning process, led by the attending physician.  Recommendations may be updated based on patient status, additional functional criteria and insurance authorization.    Follow Up Recommendations  Home health OT    Assistance Recommended at Discharge Frequent or constant Supervision/Assistance  Patient can return home with the following  Two people to help with walking and/or transfers;A lot of help with bathing/dressing/bathroom;Assistance with cooking/housework;Assistance with feeding;Assist for transportation;Help with  stairs or ramp for entrance   Equipment Recommendations  Hospital bed    Recommendations for Other Services      Precautions / Restrictions Precautions Precautions: Fall Restrictions Weight Bearing Restrictions: No Other Position/Activity Restrictions: suprapubic catheter       Mobility Bed Mobility Overal bed mobility: Needs Assistance Bed Mobility: Rolling, Sit to Supine, Supine to Sit Rolling: Min assist   Supine to sit: Mod assist, HOB elevated Sit to supine: Mod assist                     Balance Overall balance assessment: Needs assistance Sitting-balance support: Feet unsupported, Bilateral upper extremity supported Sitting balance-Leahy Scale: Fair                                     ADL either performed or assessed with clinical judgement   ADL Overall ADL's : Needs assistance/impaired                                       General ADL Comments: CGA static sitting EOB. MIN A bed level UB grooming      Cognition Arousal/Alertness: Awake/alert Behavior During Therapy: WFL for tasks assessed/performed Overall Cognitive Status: Within Functional Limits for tasks assessed                                          Exercises Exercises: General Upper Extremity General Exercises - Upper Extremity Shoulder Flexion: AROM, Strengthening, Both, 10 reps,  Supine, Theraband Theraband Level (Shoulder Flexion): Level 2 (Red) Shoulder Horizontal ABduction: AROM, Strengthening, Both, 10 reps, Supine, Theraband Theraband Level (Shoulder Horizontal Abduction): Level 2 (Red) Shoulder Horizontal ADduction: AROM, Strengthening, Both, 10 reps, Supine, Theraband Theraband Level (Shoulder Horizontal Adduction): Level 2 (Red) Elbow Flexion: AROM, Strengthening, Both, 10 reps, Supine, Theraband Theraband Level (Elbow Flexion): Level 2 (Red) Elbow Extension: AROM, Strengthening, Both, 10 reps, Supine, Theraband Theraband  Level (Elbow Extension): Level 2 (Red)            Pertinent Vitals/ Pain       Pain Assessment Pain Assessment: Faces Faces Pain Scale: Hurts even more Pain Location: R lower glute wound Pain Descriptors / Indicators: Constant, Discomfort Pain Intervention(s): Limited activity within patient's tolerance, Repositioned   Frequency  Min 2X/week        Progress Toward Goals  OT Goals(current goals can now be found in the care plan section)  Progress towards OT goals: Progressing toward goals  Acute Rehab OT Goals Patient Stated Goal: to go home OT Goal Formulation: With patient/family Time For Goal Achievement: 04/29/21 Potential to Achieve Goals: Good ADL Goals Pt Will Perform Grooming: bed level;sitting;with set-up;with supervision;with caregiver independent in assisting Pt Will Perform Lower Body Dressing: with adaptive equipment;with caregiver independent in assisting;with mod assist;sitting/lateral leans Pt Will Transfer to Toilet: bedside commode;squat pivot transfer;with min assist  Plan Discharge plan remains appropriate;Frequency remains appropriate    Co-evaluation                 AM-PAC OT "6 Clicks" Daily Activity     Outcome Measure   Help from another person eating meals?: A Little Help from another person taking care of personal grooming?: A Little Help from another person toileting, which includes using toliet, bedpan, or urinal?: A Lot Help from another person bathing (including washing, rinsing, drying)?: A Lot Help from another person to put on and taking off regular upper body clothing?: A Lot Help from another person to put on and taking off regular lower body clothing?: A Lot 6 Click Score: 14    End of Session    OT Visit Diagnosis: Other abnormalities of gait and mobility (R26.89);Other symptoms and signs involving the nervous system (R29.898);Muscle weakness (generalized) (M62.81)   Activity Tolerance Patient tolerated treatment  well;Patient limited by pain   Patient Left in bed;with call bell/phone within reach;with bed alarm set;with family/visitor present   Nurse Communication          Time: 7673-4193 OT Time Calculation (min): 29 min  Charges: OT General Charges $OT Visit: 1 Visit OT Treatments $Self Care/Home Management : 8-22 mins $Therapeutic Exercise: 8-22 mins  Kathie Dike, M.S. OTR/L  04/21/21, 3:57 PM  ascom [phone]

## 2021-04-21 NOTE — Care Management Important Message (Signed)
Important Message  Patient Details  Name: Ronald Santiago MRN: [id number] Date of Birth: June 04, 1945   Medicare Important Message Given:  Yes     Dannette Barbara 04/21/2021, 11:38 AM

## 2021-04-21 NOTE — Progress Notes (Signed)
Progress Note   Patient: Ronald Santiago [id number] DOB: October 26, 1945 DOA: 04/10/2021     11 DOS: the patient was seen and examined on 04/21/2021   Brief hospital course: Ronald Santiago is a 76 y.o. 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  He has a right ischial large decubitus ulcer that is cared for by home health nurse.  He admitted to fever of 102 on Tuesday and chills.  No fever on the day of admission. In the ED,  VSS, labs showed BUN of 41, creatinine 1.56 previously normal, UA was positive for UTI.  CT abdomen/pelvis showed SBO.  General surgery consulted.  Patient admitted for further management.  Assessment and Plan * SBO (small bowel obstruction) ([place])- (present on admission) Overall improving.  NG tube removed and patient's been resumed on diet and overall tolerated. --General surgery following --S/p NG tube, removed on 04/14/2021 --Tolerating soft diet, advance to regular. --Metamucil for fiber supplement and stool bulking. --Pain management as needed --Monitor hydration and electrolytes  --Resumed on home meds for constipation related to MS -- Monitor abdominal exam and bowel activity  1/30: had a good BM. Tolerating diet, belly is softer.  Acute lower UTI- (present on admission) POA, resolved. Urine culture growing Proteus vulagris resistant to Cipro, sensitive to Cefepime and imipenem.   -- Completed course of cefepime -- Monitor clinically  AKI (acute kidney injury) ([place])- (present on admission) POA, resolved with IV hydration. Monitor BMP.  Hypophosphatemia- (present on admission) Resolved with replacement. Replace phos as needed.  Multiple sclerosis, primary progressive ([place])- (present on admission) Continue baclofen, Ditropan, Movantik, gabapentin,  Klonopin, oxycodone PRN  Stage IV decubitus ulcer ([place])- (present on admission) No signs of infection Wound care Frequent turning every 2 hours -- New hospital bed with undulating mattress, hoyer lift ordered.   Patient and wife report improvement in his wound since admission and think his undulating mattress at home is not functioning correctly.  Need for home health care 1/30: home health agency at bedside with patient and wife, will be available once pt discharged home as soon as tomorrow, pending delivery of DME to the home including hoyer lift.     Subjective: Patient seen with wife at bedside along with representatives from my home health agency that will be assisting with caregiving at home.  We discussed getting Ronald Santiago lift for transfers until patient is able to tolerate standing again to do so.  They are agreeable.  Patient and wife report he had a good bowel movement that was actually semiformed.  Tolerating diet.  No other acute complaints.  Objective Vitals reviewed and notable for mildly soft BP 105/68 this morning.  Otherwise vital signs normal  General exam: awake, alert, no acute distress HEENT: atraumatic, clear conjunctiva, anicteric sclera, moist mucus membranes, hearing grossly normal  Respiratory system: CTAB, no wheezes, rales or rhonchi, normal respiratory effort. Cardiovascular system: normal S1/S2, RRR, no pedal edema.   Gastrointestinal system: soft, NT, ND, +bowel sounds. Central nervous system: A&O x4. no gross focal neurologic deficits, normal speech Extremities: Hypertonicity in the right hand noted and appear stable, no edema,  Skin: dry, intact, normal temperature Psychiatry: normal mood, congruent affect, judgement and insight appear normal   Data Reviewed:  There are no new results to review at this time.  Family Communication: Wife  at bedside on rounds  Disposition: Status is: Inpatient  Remains inpatient appropriate because: Unsafe discharge  until and DME has been delivered to the home including [facility] lift as patient is unable to stand for transfers and hygiene care         Time spent: 25 minutes  Author: Ezekiel Slocumb, DO 04/21/2021 3:45 PM  For on call review [email or web address].

## 2021-04-22 DIAGNOSIS — K56609 Unspecified intestinal obstruction, unspecified as to partial versus complete obstruction: Secondary | ICD-10-CM | POA: Diagnosis not present

## 2021-04-22 MED ORDER — PSYLLIUM 95 % PO PACK: 1.0000 | PACK | Freq: Every day | ORAL | Status: DC

## 2021-04-22 NOTE — Progress Notes (Signed)
Patient discharged to home with home health via EMT transport. Patient and wife received discharge instructions and dressing change teaching. Patient received supplies to complete dressing changes. Patient and wife were educated on medication list and future appointments. Vitals stable at discharge. Discharge instructions given to patient with no following questions or concerns. PIVX1 removed with catheter intact.

## 2021-04-22 NOTE — Progress Notes (Signed)
Occupational Therapy Treatment Patient Details Name: Ronald Santiago MRN: [id number] DOB: Mar 28, 1945 Today's Date: 04/22/2021   History of present illness Ronald Santiago is a 76 y.o. Caucasian male with medical history significant for primary progressive multiple sclerosis, R ischial decubitus ulcer, and chronic kidney disease, who presented to the ER with acute onset of recurrent nausea and vomiting and abdominal pain. Surgery consulted for probable small bowel obstruction -vs- ileus in setting of UTI -vs- chronic constipation -vs- complication of MS. NGT placed, now d/c'ed. Pt admitted for further mgt.Patient requested d/c from physical therapy services on 04/15/21 but physician requests patient to be re-assessed.   OT comments  Ronald Santiago was seen for OT treatment on this date. Upon arrival to room pt reclined in bed, agreeable to tx, reports low BP this AM and defers mobility trials. Pt tolerated BUE supine therex as described below using green theraband (provided) with good tolerance of increased resistance from prior date. All questions answered on HEP with good return of proper technique. Pt making good progress toward goals. Pt continues to benefit from skilled OT services. Will continue to follow POC. Discharge recommendation remains appropriate.     Recommendations for follow up therapy are one component of a multi-disciplinary discharge planning process, led by the attending physician.  Recommendations may be updated based on patient status, additional functional criteria and insurance authorization.    Follow Up Recommendations  Home health OT    Assistance Recommended at Discharge Frequent or constant Supervision/Assistance  Patient can return home with the following  Two people to help with walking and/or transfers;A lot of help with bathing/dressing/bathroom;Assistance with cooking/housework;Assistance with feeding;Assist for transportation;Help with stairs or ramp for entrance   Equipment  Recommendations  Hospital bed    Recommendations for Other Services      Precautions / Restrictions Precautions Precautions: Fall Restrictions Weight Bearing Restrictions: No Other Position/Activity Restrictions: suprapubic catheter       Mobility Bed Mobility               General bed mobility comments: deferred at pt request                     ADL either performed or assessed with clinical judgement   ADL Overall ADL's : Needs assistance/impaired                                       General ADL Comments: SETUP self-drinking bed level      Cognition Arousal/Alertness: Awake/alert Behavior During Therapy: WFL for tasks assessed/performed Overall Cognitive Status: Within Functional Limits for tasks assessed                                          Exercises Exercises: General Upper Extremity General Exercises - Upper Extremity Shoulder Flexion: AROM, Strengthening, Both, 10 reps, Supine, Theraband Theraband Level (Shoulder Flexion): Level 3 (Green) Shoulder ABduction: AROM, Strengthening, Both, 10 reps, Supine, Theraband Theraband Level (Shoulder Abduction): Level 3 (Green) Shoulder ADduction: AROM, Strengthening, Both, 10 reps, Supine, Theraband Theraband Level (Shoulder Adduction): Level 3 (Green) Shoulder Horizontal ABduction: AROM, Strengthening, Both, 10 reps, Supine, Theraband Theraband Level (Shoulder Horizontal Abduction): Level 3 (Green) Shoulder Horizontal ADduction: AROM, Strengthening, Both, 10 reps, Supine, Theraband Theraband Level (Shoulder Horizontal Adduction): Level 3 (Green) Elbow Flexion: AROM, Strengthening,  Both, 10 reps, Supine, Theraband Theraband Level (Elbow Flexion): Level 3 (Green) Elbow Extension: AROM, Strengthening, Both, 10 reps, Supine, Theraband Theraband Level (Elbow Extension): Level 3 (Green) Wrist Flexion: AROM, Strengthening, Both, 10 reps, Supine, Theraband Theraband Level  (Wrist Flexion): Level 3 (Green) Wrist Extension: AROM, Strengthening, Both, 10 reps, Supine, Theraband Theraband Level (Wrist Extension): Level 3 (Green)     Pertinent Vitals/ Pain       Pain Assessment Pain Assessment: No/denies pain   Frequency  Min 2X/week        Progress Toward Goals  OT Goals(current goals can now be found in the care plan section)  Progress towards OT goals: Progressing toward goals  Acute Rehab OT Goals Patient Stated Goal: to go home OT Goal Formulation: With patient/family Time For Goal Achievement: 04/29/21 Potential to Achieve Goals: Good ADL Goals Pt Will Perform Grooming: bed level;sitting;with set-up;with supervision;with caregiver independent in assisting Pt Will Perform Lower Body Dressing: with adaptive equipment;with caregiver independent in assisting;with mod assist;sitting/lateral leans Pt Will Transfer to Toilet: bedside commode;squat pivot transfer;with min assist  Plan Discharge plan remains appropriate;Frequency remains appropriate    Co-evaluation                 AM-PAC OT "6 Clicks" Daily Activity     Outcome Measure   Help from another person eating meals?: A Little Help from another person taking care of personal grooming?: A Little Help from another person toileting, which includes using toliet, bedpan, or urinal?: A Lot Help from another person bathing (including washing, rinsing, drying)?: A Lot Help from another person to put on and taking off regular upper body clothing?: A Lot Help from another person to put on and taking off regular lower body clothing?: A Lot 6 Click Score: 14    End of Session    OT Visit Diagnosis: Other abnormalities of gait and mobility (R26.89);Other symptoms and signs involving the nervous system (R29.898);Muscle weakness (generalized) (M62.81)   Activity Tolerance Patient tolerated treatment well;Patient limited by pain   Patient Left in bed;with call bell/phone within reach    Nurse Communication          Time: 1056-1106 OT Time Calculation (min): 10 min  Charges: OT General Charges $OT Visit: 1 Visit OT Treatments $Therapeutic Exercise: 8-22 mins  Dessie Coma, M.S. OTR/L  04/22/21, 11:16 AM  ascom [phone]

## 2021-04-22 NOTE — Discharge Summary (Addendum)
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   UTI secondary to suprapubic catheter  Hospital Course: Ronald Santiago is a 76 y.o. 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  He has a right ischial large decubitus ulcer that is cared for by home health nurse.  He admitted to fever of 102 on Tuesday and chills.  No fever on the day of admission. In the ED,  VSS, labs showed BUN of 41, creatinine 1.56 previously normal, UA was positive for UTI.  CT abdomen/pelvis showed SBO.  General surgery consulted.  Patient admitted for further management.  Assessment and Plan: * SBO (small bowel obstruction) ([place])- (present on admission) Overall improving.  NG tube removed and patient's been resumed on diet and overall tolerated. --General surgery following --S/p NG tube, removed on 04/14/2021 --Tolerating soft diet, advance to regular. --Metamucil for fiber supplement and stool bulking. --Pain management as needed --Monitor hydration and electrolytes  --Resumed on home meds for constipation related to MS -- Monitor abdominal exam and bowel activity  1/30: had a good BM.  Tolerating diet, belly is softer.  Acute lower UTI- (present on admission) POA, resolved. Urine culture growing Proteus vulagris resistant to Cipro, sensitive to Cefepime and imipenem.   -- Completed course of cefepime -- Monitor clinically  AKI (acute kidney injury) ([place])- (present on admission) POA, resolved with IV hydration. Monitor BMP.  Hypophosphatemia- (present on admission) Resolved with replacement. Replace phos as needed.  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   Patient and wife report improvement in his wound since admission and think his undulating mattress at home is not functioning correctly.  Need for home health care 1/30: home health agency at bedside with patient and wife, will be available once pt discharged home as soon as tomorrow, pending delivery of DME to the home including hoyer lift.           Consultants: Surgery Procedures performed: NG tube placement, removal  Disposition: Home Diet recommendation:  Discharge Diet Orders (From admission, onward)     Start     Ordered   04/22/21 0000  Diet - low sodium heart healthy        04/22/21 1200           Regular diet  DISCHARGE MEDICATION: Allergies as of 04/22/2021       Reactions   Cephalexin Anaphylaxis, Hives        Medication List  STOP taking these medications    amoxicillin-clavulanate 875-125 MG tablet Commonly known as: AUGMENTIN       TAKE these medications    baclofen 20 MG tablet Commonly known as: LIORESAL Take 20-40 mg by mouth 3 (three) times daily.   cetirizine 10 MG tablet Commonly known as: ZYRTEC Take 10 mg by mouth daily.   clonazePAM 1 MG  tablet Commonly known as: KLONOPIN Take 1 mg by mouth at bedtime as needed.   D3-1000 PO Take 1,000 Units by mouth daily.   DSS 100 MG Caps Take 300 mg by mouth at bedtime.   gabapentin 600 MG tablet Commonly known as: NEURONTIN Take 1,200 mg by mouth 3 (three) times daily.   Magnesium 400 MG Tabs Take 400 mg by mouth daily.   Movantik 25 MG Tabs tablet Generic drug: naloxegol oxalate Take 25 mg by mouth daily.   nystatin powder Commonly known as: MYCOSTATIN/NYSTOP SMARTSIG:1 Application Topical 2-3 Times Daily   oxybutynin 10 MG 24 hr tablet Commonly known as: DITROPAN-XL Take 10 mg by mouth 3 (three) times daily.   oxyCODONE 5 MG immediate release tablet Commonly known as: Oxy IR/ROXICODONE Take 5 mg by mouth every 4 (four) hours as needed for moderate pain or severe pain.   PROBIOTIC-10 PO Take by mouth.   psyllium 95 % Pack Commonly known as: HYDROCIL/METAMUCIL Take 1 packet by mouth daily.   Pumpkin Seed 500 MG Caps Take 1,000 mg by mouth daily.               Discharge Care Instructions  (From admission, onward)           Start     Ordered   04/22/21 0000  Discharge wound care:       Comments: Wound care to right ischial tuberosity Stage 4 pressure injury, chronic nonhealing:    Cleanse with NS, pat dry.  Cover with size appropriate piece of silver hydrofiber (Aquacel Ag+ Advantage, [place] # [id number]) using a cotton tipped applicator to tuck into undermined area at 10 o'clock.  Top with dry gauze and cover with silicone foam.  Change daily and PRN soiling.   Turn patient from side to side and keep HOB at or below a 30 degree angle.   04/22/21 1200            Follow-up Information     [facility] Follow up.   Specialty: Home Health Services Why: They will resume home health services at discharge. Contact information: [address] [place] [address] [phone]                 Discharge  Exam: Danley Danker Weights   04/10/21 1150  Weight: 90.1 kg   General exam: awake, alert, no acute distress HEENT: moist mucus membranes, hearing grossly normal  Respiratory system: CTAB, no wheezes, rales or rhonchi, normal respiratory effort. Cardiovascular system: normal S1/S2, RRR, no JVD, murmurs, rubs, gallops,  no pedal edema.   Gastrointestinal system: soft, NT, ND, no HSM felt, +bowel sounds. Central nervous system: A&O x3. no gross focal neurologic deficits, normal speech Extremities: no edema, moves all, stable hypertonicity of hands Skin: dry, intact, normal temperature Psychiatry: normal mood, congruent affect, judgement and insight appear normal   Condition at discharge: stable  The results of significant diagnostics from this hospitalization (including imaging, microbiology, ancillary and laboratory) are listed below for reference.   Imaging Studies: DG Abd 1 View  Result Date: 04/12/2021 CLINICAL DATA:  Small-bowel  obstruction re-evaluation. EXAM: ABDOMEN - 1 VIEW COMPARISON:  Abdominal radiograph 04/19/2021. FINDINGS: Redemonstrated multiple gaseous distended loops of small bowel within the central abdomen, not significantly changed from prior exam. Enteric tube visualized within the upper abdomen. Cholecystectomy clips. Lumbar spine degenerative changes. IMPRESSION: Similar-appearing small-bowel obstruction. Electronically Signed   By: Lovey Newcomer M.D.   On: 04/12/2021 09:23   DG Abd 1 View  Result Date: 04/11/2021 CLINICAL DATA:  Small bowel obstruction. EXAM: ABDOMEN - 1 VIEW COMPARISON:  04/10/2021 FINDINGS: The enteric tube tip and side port are below the level of the GE junction within the gastric fundus. Cholecystectomy clips. Suprapubic bladder catheter identified. No change in diffuse small bowel distension compatible with obstruction. IMPRESSION: No change in small bowel obstruction pattern. Electronically Signed   By: Kerby Moors M.D.   On: 04/11/2021 07:19   CT  Abdomen Pelvis W Contrast  Result Date: 04/10/2021 CLINICAL DATA:  Abdominal pain.  Nausea and vomiting. EXAM: CT ABDOMEN AND PELVIS WITH CONTRAST TECHNIQUE: Multidetector CT imaging of the abdomen and pelvis was performed using the standard protocol following bolus administration of intravenous contrast. RADIATION DOSE REDUCTION: This exam was performed according to the departmental dose-optimization program which includes automated exposure control, adjustment of the mA and/or kV according to patient size and/or use of iterative reconstruction technique. CONTRAST:  132mL OMNIPAQUE IOHEXOL 300 MG/ML  SOLN COMPARISON:  CT stone study 06/24/2020 FINDINGS: Lower chest: Dependent atelectasis noted both lower lungs. Hepatobiliary: Similar appearance multiple hepatic cysts. Gallbladder surgically absent. No intrahepatic or extrahepatic biliary dilation. Pancreas: No focal mass lesion. No dilatation of the main duct. No intraparenchymal cyst. No peripancreatic edema. Spleen: Several hypoattenuating lesions in the spleen appear stable in the interval, likely cysts or pseudocyst. Adrenals/Urinary Tract: No adrenal nodule or mass. Small cyst noted left kidney. Additional tiny hypoattenuating lesions in both kidneys are too small to characterize but likely benign. No evidence for hydroureter. Suprapubic tube decompresses the urinary bladder. Air in the bladder lumen is compatible with the presence of the catheter. Stomach/Bowel: Stomach is markedly distended with gas and fluid. Duodenum is dilated. Proximal jejunal loops are dilated up to 4.6 cm diameter. Although a discrete transition zone cannot be identified, the distal and terminal ileum is completely decompressed. The appendix is fluid-filled and distended up to 9 mm diameter but there is no periappendiceal edema or inflammation. Air in stool is seen scattered along the length of a nondilated colon. Vascular/Lymphatic: There is mild atherosclerotic calcification of the  abdominal aorta without aneurysm. There is no gastrohepatic or hepatoduodenal ligament lymphadenopathy. No retroperitoneal or mesenteric lymphadenopathy. Orientation of the SMV and SMA is similar to the prior study. No pelvic sidewall lymphadenopathy. Reproductive: The prostate gland and seminal vesicles are unremarkable. Other: No intraperitoneal free fluid. Musculoskeletal: No worrisome lytic or sclerotic osseous abnormality. IMPRESSION: 1. Marked distention of the stomach, duodenum, and jejunum. Although a discrete transition zone cannot be identified, the distal and terminal ileum is completely decompressed. Imaging features are compatible with small bowel obstruction although etiology is not apparent on this exam. No small bowel wall thickening or pneumatosis. No intraperitoneal free fluid. 2. The appendix is fluid-filled and distended up to 9 mm diameter but there is no periappendiceal edema or inflammation. 3. Hepatic and renal cysts. 4. Aortic Atherosclerosis (ICD10-I70.0). Electronically Signed   By: Misty Stanley M.D.   On: 04/10/2021 14:51   DG Chest Port 1 View  Result Date: 04/10/2021 CLINICAL DATA:  Shortness of breath EXAM: PORTABLE CHEST 1  VIEW COMPARISON:  Abdominal pain, nausea, vomiting, shortness of breath FINDINGS: The heart size and mediastinal contours are within normal limits. Mild, diffuse interstitial pulmonary opacity. Elevation of the left hemidiaphragm. The visualized skeletal structures are unremarkable. IMPRESSION: 1. Mild, diffuse interstitial pulmonary opacity, which may reflect edema or infection. No focal airspace opacity. 2.  Elevation of the left hemidiaphragm. Electronically Signed   By: Delanna Ahmadi M.D.   On: 04/10/2021 14:04   DG ABD ACUTE 2+V W 1V CHEST  Result Date: 04/14/2021 CLINICAL DATA:  Pain, small-bowel obstruction EXAM: DG ABDOMEN ACUTE WITH 1 VIEW CHEST COMPARISON:  04/13/2021 FINDINGS: Nasogastric tube coiled in proximal stomach. Normal heart size,  mediastinal contours, and pulmonary vascularity. Atherosclerotic calcification aorta. Bronchitic changes and minimal bibasilar atelectasis. No pulmonary infiltrate, pleural effusion or pneumothorax. Persistent gaseous distension of small bowel loops consistent with small bowel obstruction. Small amounts of colonic gas. No bowel wall thickening or free air. Suprapubic catheter. Bones demineralized. IMPRESSION: Persistent small bowel obstruction. Bronchitic changes with minimal bibasilar atelectasis. Electronically Signed   By: Lavonia Dana M.D.   On: 04/14/2021 08:41   DG ABD ACUTE 2+V W 1V CHEST  Result Date: 04/13/2021 CLINICAL DATA:  Small bowel obstruction. EXAM: DG ABDOMEN ACUTE WITH 1 VIEW CHEST COMPARISON:  04/12/2021 FINDINGS: The enteric tube tip and side port course below the level of the GE junction terminating in the gastric fundus. Dilated small bowel loops are again noted and appear unchanged from the previous exam. Lungs remain clear. Mild asymmetric elevation of the left hemidiaphragm. IMPRESSION: 1. No change in small bowel obstruction pattern. 2. The enteric tube tip is in the gastric fundus. Electronically Signed   By: Kerby Moors M.D.   On: 04/13/2021 09:19   DG Abd Portable 1V  Result Date: 04/17/2021 CLINICAL DATA:  Small-bowel obstruction, abdominal pain, diarrhea for last few days EXAM: PORTABLE ABDOMEN - 1 VIEW COMPARISON:  Portable exam 1120 hours compared to 04/14/2021 FINDINGS: Some gas and stool within colon. Persistent dilatation of small bowel loops. Questionable mild wall thickening of adjacent small bowel loops in the RIGHT upper quadrant. No acute osseous findings. Catheter projects over bladder. IMPRESSION: Persistent small bowel dilatation greater than and gas/stool in colon consistent with persistent small bowel obstruction. Electronically Signed   By: Lavonia Dana M.D.   On: 04/17/2021 13:40   DG Abd Portable 1 View  Result Date: 04/10/2021 CLINICAL DATA:  NG tube  placement. EXAM: PORTABLE ABDOMEN - 1 VIEW COMPARISON:  None. FINDINGS: Nasogastric tube with the tip projecting over the fundus of the stomach. Gaseous distension of the small bowel and colon. No bowel dilatation to suggest obstruction. No evidence of pneumoperitoneum, portal venous gas or pneumatosis. No pathologic calcifications along the expected course of the ureters. No acute osseous abnormality. IMPRESSION: Nasogastric tube with the tip projecting over the fundus of the stomach. Electronically Signed   By: Kathreen Devoid M.D.   On: 04/10/2021 16:11    Microbiology: Results for orders placed or performed during the hospital encounter of 04/10/21  Resp Panel by RT-PCR (Flu A&B, Covid) Nasopharyngeal Swab     Status: None   Collection Time: 04/10/21  3:43 PM   Specimen: Nasopharyngeal Swab; Nasopharyngeal(NP) swabs in vial transport medium  Result Value Ref Range Status   SARS Coronavirus 2 by RT PCR NEGATIVE NEGATIVE Final    Comment: (NOTE) SARS-CoV-2 target nucleic acids are NOT DETECTED.  The SARS-CoV-2 RNA is generally detectable in upper respiratory specimens during the acute phase  of infection. The lowest concentration of SARS-CoV-2 viral copies this assay can detect is 138 copies/mL. A negative result does not preclude SARS-Cov-2 infection and should not be used as the sole basis for treatment or other patient management decisions. A negative result may occur with  improper specimen collection/handling, submission of specimen other than nasopharyngeal swab, presence of viral mutation(s) within the areas targeted by this assay, and inadequate number of viral copies(<138 copies/mL). A negative result must be combined with clinical observations, patient history, and epidemiological information. The expected result is Negative.  Fact Sheet for Patients:  EntrepreneurPulse.com.au  Fact Sheet for Healthcare Providers:   IncredibleEmployment.be  This test is no t yet approved or cleared by the [place] FDA and  has been authorized for detection and/or diagnosis of SARS-CoV-2 by FDA under an Emergency Use Authorization (EUA). This EUA will remain  in effect (meaning this test can be used) for the duration of the COVID-19 declaration under Section 564(b)(1) of the Act, 21 U.S.C.section 360bbb-3(b)(1), unless the authorization is terminated  or revoked sooner.       Influenza A by PCR NEGATIVE NEGATIVE Final   Influenza B by PCR NEGATIVE NEGATIVE Final    Comment: (NOTE) The Xpert Xpress SARS-CoV-2/FLU/RSV plus assay is intended as an aid in the diagnosis of influenza from Nasopharyngeal swab specimens and should not be used as a sole basis for treatment. Nasal washings and aspirates are unacceptable for Xpert Xpress SARS-CoV-2/FLU/RSV testing.  Fact Sheet for Patients: EntrepreneurPulse.com.au  Fact Sheet for Healthcare Providers: IncredibleEmployment.be  This test is not yet approved or cleared by the [place] FDA and has been authorized for detection and/or diagnosis of SARS-CoV-2 by FDA under an Emergency Use Authorization (EUA). This EUA will remain in effect (meaning this test can be used) for the duration of the COVID-19 declaration under Section 564(b)(1) of the Act, 21 U.S.C. section 360bbb-3(b)(1), unless the authorization is terminated or revoked.  Performed at [facility], [address], [place], [place] [address]   Urine Culture     Status: Abnormal   Collection Time: 04/11/21 10:40 AM   Specimen: Urine, Clean Catch  Result Value Ref Range Status   Specimen Description   Final    URINE, CLEAN CATCH Performed at [facility], [address] [place], [place] [address]    Special Requests   Final    NONE Performed at [facility], [place], [place] [address]     Culture >=100,000 COLONIES/mL PROTEUS VULGARIS (A)  Final   Report Status 04/13/2021 FINAL  Final   Organism ID, Bacteria PROTEUS VULGARIS (A)  Final      Susceptibility   Proteus vulgaris - MIC*    AMPICILLIN >=32 RESISTANT Resistant     CEFAZOLIN >=64 RESISTANT Resistant     CEFEPIME <=0.12 SENSITIVE Sensitive     CIPROFLOXACIN 2 RESISTANT Resistant     GENTAMICIN <=1 SENSITIVE Sensitive     IMIPENEM 2 SENSITIVE Sensitive     NITROFURANTOIN 128 RESISTANT Resistant     TRIMETH/SULFA <=20 SENSITIVE Sensitive     AMPICILLIN/SULBACTAM 8 SENSITIVE Sensitive     PIP/TAZO <=4 SENSITIVE Sensitive     * >=100,000 COLONIES/mL PROTEUS VULGARIS    Labs: CBC: No results for input(s): WBC, NEUTROABS, HGB, HCT, MCV, PLT in the last 168 hours.  Basic Metabolic Panel: No results for input(s): NA, K, CL, CO2, GLUCOSE, BUN, CREATININE, CALCIUM, MG, PHOS in the last 168 hours.  Liver Function Tests:  No results for input(s): AST, ALT, ALKPHOS, BILITOT, PROT, ALBUMIN in the last 168 hours. CBG: No results for input(s): GLUCAP in the last 168 hours.  Discharge time spent: greater than 30 minutes.  Signed: Ezekiel Slocumb, DO [facility] 05/05/2021

## 2021-04-22 NOTE — Plan of Care (Signed)
Problem: Education: Goal: Knowledge of General Education information will improve Description: Including pain rating scale, medication(s)/side effects and non-pharmacologic comfort measures Outcome: Completed/Met   Problem: Clinical Measurements: Goal: Ability to maintain clinical measurements within normal limits will improve Outcome: Completed/Met Goal: Will remain free from infection Outcome: Completed/Met Goal: Diagnostic test results will improve Outcome: Completed/Met Goal: Respiratory complications will improve Outcome: Completed/Met Goal: Cardiovascular complication will be avoided Outcome: Completed/Met   Problem: Activity: Goal: Risk for activity intolerance will decrease Outcome: Completed/Met   Problem: Nutrition: Goal: Adequate nutrition will be maintained Outcome: Completed/Met   Problem: Coping: Goal: Level of anxiety will decrease Outcome: Completed/Met   Problem: Elimination: Goal: Will not experience complications related to bowel motility Outcome: Completed/Met Goal: Will not experience complications related to urinary retention Outcome: Completed/Met   Problem: Pain Managment: Goal: General experience of comfort will improve Outcome: Completed/Met   Problem: Safety: Goal: Ability to remain free from injury will improve Outcome: Completed/Met   Problem: Skin Integrity: Goal: Risk for impaired skin integrity will decrease Outcome: Completed/Met   Problem: Acute Rehab OT Goals (only OT should resolve) Goal: Pt. Will Perform Grooming Outcome: Completed/Met Goal: Pt. Will Perform Lower Body Dressing Outcome: Completed/Met Goal: Pt. Will Transfer To Toilet Outcome: Completed/Met   Problem: Acute Rehab PT Goals(only PT should resolve) Goal: Pt will Roll Supine to Side Outcome: Completed/Met Goal: Pt Will Go Supine/Side To Sit Outcome: Completed/Met Goal: Pt/caregiver will Perform Home Exercise Program Outcome: Completed/Met

## 2021-04-22 NOTE — TOC Transition Note (Signed)
Transition of Care [facility]) - CM/SW Discharge Note   Patient Details  Name: Ronald Santiago MRN: [id number] Date of Birth: [birth year]  Transition of [facility]) CM/SW Contact:  Margarito Liner, LCSW Phone Number: 04/22/2021, 1:38 PM   Clinical Narrative:  Patient has orders to discharge home today. [facility] representative is aware. DME has been delivered. EMS unable to transport to home in [place]. Set up non-emergency ambulance with [facility] and they will be here between 4:00-4:30. No further concerns. CSW signing off.   Final next level of care: Home w Home Health Services Barriers to Discharge: Barriers Resolved   Patient Goals and CMS Choice        Discharge Placement                Patient to be transferred to facility by: [facility] Transport Name of family member notified: Angelique Blonder Ricciuti Patient and family notified of of transfer: 04/22/21  Discharge Plan and Services                DME Arranged: Air overlay mattress, Trapeze, Other see comment Michiel Sites lift) DME Agency: [employer] Date DME Agency Contacted: 04/22/21 Time DME Agency Contacted: [phone] Representative spoke with at DME Agency: Duwayne Heck HH Arranged: RN, PT, OT, Nurse's Aide HH Agency: [facility] Date [facility] Agency Contacted: 04/22/21   Representative spoke with at [facility] Agency: [employer] [facility]  Social Determinants of Health (SDOH) Interventions     Readmission Risk Interventions No flowsheet data found.

## 2021-05-11 ENCOUNTER — Emergency Department
Admission: EM | Admit: 2021-05-11 | Discharge: 2021-05-11 | Disposition: A | Discharge status: Home / Self Care | Payer: Medicare Other | Attending: Emergency Medicine | Admitting: Emergency Medicine

## 2021-05-11 ENCOUNTER — Other Ambulatory Visit: Payer: Self-pay

## 2021-05-11 DIAGNOSIS — R339 Retention of urine, unspecified: Secondary | ICD-10-CM | POA: Diagnosis not present

## 2021-05-11 DIAGNOSIS — T83091A Other mechanical complication of indwelling urethral catheter, initial encounter: Secondary | ICD-10-CM | POA: Diagnosis present

## 2021-05-11 DIAGNOSIS — R208 Other disturbances of skin sensation: Secondary | ICD-10-CM | POA: Insufficient documentation

## 2021-05-11 DIAGNOSIS — L89154 Pressure ulcer of sacral region, stage 4: Secondary | ICD-10-CM | POA: Insufficient documentation

## 2021-05-11 DIAGNOSIS — T83010A Breakdown (mechanical) of cystostomy catheter, initial encounter: Secondary | ICD-10-CM

## 2021-05-11 DIAGNOSIS — T83028A Displacement of other indwelling urethral catheter, initial encounter: Secondary | ICD-10-CM | POA: Diagnosis not present

## 2021-05-11 MED ORDER — LIDOCAINE-EPINEPHRINE 2 %-1:100000 IJ SOLN
20.0000 mL | Freq: Once | INTRAMUSCULAR | Status: AC
Start: 2021-05-11 — End: 2021-05-11
  Administered 2021-05-11: 20 mL
  Filled 2021-05-11: qty 1

## 2021-05-11 MED ORDER — BACLOFEN 10 MG PO TABS
20.0000 mg | ORAL_TABLET | ORAL | Status: DC
  Filled 2021-05-11: qty 2

## 2021-05-11 MED ORDER — GABAPENTIN 600 MG PO TABS
1200.0000 mg | ORAL_TABLET | Freq: Three times a day (TID) | ORAL | Status: DC
  Administered 2021-05-11: 1200 mg via ORAL
  Filled 2021-05-11: qty 2

## 2021-05-11 MED ORDER — OXYCODONE HCL 5 MG PO TABS
5.0000 mg | ORAL_TABLET | ORAL | Status: DC | PRN
  Administered 2021-05-11: 5 mg via ORAL
  Filled 2021-05-11: qty 1

## 2021-05-11 MED ORDER — SULFAMETHOXAZOLE-TRIMETHOPRIM 800-160 MG PO TABS: 1.0000 | ORAL_TABLET | Freq: Two times a day (BID) | ORAL | 0 refills | Status: AC

## 2021-05-11 NOTE — ED Triage Notes (Addendum)
Pt arrives via [facility] from home for abd pain, penial pain, and his suprapubic cath not draining- pt has a hx of MS and trigeminy- wife tried to irrigate it and was unsuccessful so she tried to replace the catheter and could not get the new one in- VSS per EMS

## 2021-05-11 NOTE — ED Provider Notes (Addendum)
[facility] Provider Note    Event Date/Time   First MD Initiated Contact with Patient 05/11/21 [phone]     (approximate)   History   Catheter Issue   HPI  Ronald Santiago is a 76 y.o. male  medical history significant for MS, CKD, chronic suprapubic indwelling Foley catheter, and recent admission 1/19 to 2/13 for medical management of an SBO and UTI patient also found to have sacral wound and AKI who presents via EMS for evaluation of some abdominal pain and difficulty with his suprapubic Foley catheter.  It seems it became clogged and was noted to be clogged earlier this morning and was drained by his wife around 4 AM.  He states his abdominal pain got worse around then.  States had a little bit of leakage of urine from his penis which he usually does not have.  He thinks that abdominal pain is from his bladder.  States he is also having some burning spasms in his arms and legs from his MS but is not different today than usual.  No diarrhea, vomiting, chest pain, cough, shortness of breath, fevers or any other acute sick symptoms.      Physical Exam  Triage Vital Signs: ED Triage Vitals  Enc Vitals Group     BP      Pulse      Resp      Temp      Temp src      SpO2      Weight      Height      Head Circumference      Peak Flow      Pain Score      Pain Loc      Pain Edu?      Excl. in [place]?     Most recent vital signs: Vitals:   05/11/21 0900 05/11/21 0930  BP: 135/62 128/65  Pulse: 92 (!) 101  Resp: 18 (!) 22  Temp:    SpO2: 99% 98%    General: Awake, appears uncomfortable. CV:  Good peripheral perfusion.  2+ radial pulses. Resp:  Normal effort.  Abd:  No distention.  Suprapubic Foley catheter site is noted without significant surrounding skin changes.  Abdomen is slightly distended and tender in the suprapubic region. Other:  Stage IV right-sided sacral decubitus wound.  Surrounding skin changes distressed acute cellulitis at this  time.   ED Results / Procedures / Treatments  Labs (all labs ordered are listed, but only abnormal results are displayed) Labs Reviewed  CBC WITH DIFFERENTIAL/PLATELET     EKG    RADIOLOGY    PROCEDURES:    MEDICATIONS ORDERED IN ED: Medications  baclofen (LIORESAL) tablet 20 mg (has no administration in time range)  gabapentin (NEURONTIN) tablet 1,200 mg (1,200 mg Oral Given 05/11/21 0849)  oxyCODONE (Oxy IR/ROXICODONE) immediate release tablet 5 mg (5 mg Oral Given 05/11/21 0849)  lidocaine-EPINEPHrine (XYLOCAINE W/EPI) 2 %-1:100000 (with pres) injection 20 mL (has no administration in time range)  lidocaine-EPINEPHrine (XYLOCAINE W/EPI) 2 %-1:100000 (with pres) injection 20 mL (has no administration in time range)     IMPRESSION / MDM / ASSESSMENT AND PLAN / ED COURSE  I reviewed the triage vital signs and the nursing notes.                              Differential diagnosis includes, but is not limited to abdominal pain secondary  to acute urinary tension from dysfunctional and not removed Foley catheter versus possible cystitis and concern for possible associated kidney injury.  I did attempt to place Foley catheter x2 with size 16 and 14 but was able to do so.  Home pain medications reordered.  I consulted with on-call urologist Dr. Gloriann Loan did come to bedside and was successfully able to place Foley catheter.  Patient had gross return of some pink-colored urine.  No need at this point for Dr. Gloriann Loan for additional diagnostic studies such as urine culture or kidney function.  He does recommend 3 days of Bactrim for prophylactic coverage given manipulation.  This was sent.  Patient states he is feeling much better on my assessment.  I suspect symptoms related to acute urinary retention.  At this point I think patient is stable for discharge with outpatient follow-up.  Discharged in stable condition.  Strict return precautions advised and discussed.     FINAL CLINICAL  IMPRESSION(S) / ED DIAGNOSES   Final diagnoses:  Urinary retention  Suprapubic catheter dysfunction, initial encounter [facility])     Rx / DC Orders   ED Discharge Orders     None        Note:  This document was prepared using Dragon voice recognition software and may include unintentional dictation errors.   Lucrezia Starch, MD 05/11/21 1053    Lucrezia Starch, MD 05/11/21 1101

## 2021-05-11 NOTE — ED Notes (Signed)
Attempted to get blood from pt with no success and Crystal RN had previously attempted blood drawn without success

## 2021-05-11 NOTE — ED Notes (Signed)
[facility] will be transporting pt home

## 2021-05-11 NOTE — Consult Note (Addendum)
H&P Physician requesting consult: Antoine Primas  Chief Complaint: Urinary retention, displaced suprapubic tube  History of Present Illness: 76 year old male with chronic suprapubic tube had issues with the tube draining this morning.  Attempted exchange failed.  He was brought to the emergency department where suprapubic tube was unable to be replaced.  Past Medical History:  Diagnosis Date   Chronic kidney disease    Multiple sclerosis, primary progressive (HCC)    Past Surgical History:  Procedure Laterality Date   BACK SURGERY     CHOLECYSTECTOMY     EYE SURGERY      Home Medications:  (Not in a hospital admission)  Allergies:  Allergies  Allergen Reactions   Cephalexin Anaphylaxis and Hives    No family history on file. Social History:  reports that he has been smoking. He has never used smokeless tobacco. No history on file for alcohol use and drug use.  ROS: A complete review of systems was performed.  All systems are negative except for pertinent findings as noted. ROS   Physical Exam:  Vital signs in last 24 hours: Temp:  [98.2 F (36.8 C)] 98.2 F (36.8 C) (02/19 0751) Pulse Rate:  [82-101] 101 (02/19 0930) Resp:  [14-28] 22 (02/19 0930) BP: (118-153)/(49-112) 128/65 (02/19 0930) SpO2:  [98 %-100 %] 98 % (02/19 0930) Weight:  [81.6 kg] 81.6 kg (02/19 0752) General:  Alert and oriented, No acute distress HEENT: Normocephalic, atraumatic Neck: No JVD or lymphadenopathy Cardiovascular: Regular rate and rhythm Lungs: Regular rate and effort Abdomen: Firm and distended secondary to his bladder Back: No CVA tenderness Extremities: No edema   Laboratory Data:  No results found for this or any previous visit (from the past 24 hour(s)). No results found for this or any previous visit (from the past 240 hour(s)). Creatinine: No results for input(s): CREATININE in the last 168 hours.  Procedure: Informed consent obtained.  He was prepped and draped.  I  attempted to pass a wire through the suprapubic tube tract but it was closed.  I therefore anesthetized the area with Xylocaine with epinephrine.  I then advanced a spinal needle through the suprapubic tube site and into the bladder and aspirated urine confirming proper placement.  I withdrew this and noted the length to get to the bladder.  I then advanced a suprapubic tube trocar through the previous suprapubic tube site and into the bladder.  I removed the inner trocar with copious return of urine.  16 French silicone catheter passed through the sheath and into the bladder and the sheath was removed.  10 cc of sterile water was instilled into the catheter balloon.  The suprapubic tube was secured down with a nylon stitch.  This concluded the procedure.  Patient tolerated the procedure well.  Impression/Assessment:  Urinary retention Displaced suprapubic tube  Plan:  He should maintain the suprapubic tube for 4 to 6 weeks and have it exchanged in the clinic.  After that, it can be exchanged by home health.  Recommend 3 days of Bactrim for prophylaxis given the instrumentation.  Ray Church, III 05/11/2021, 10:56 AM

## 2021-06-23 ENCOUNTER — Encounter: Payer: Self-pay | Admitting: Urology

## 2021-06-23 ENCOUNTER — Ambulatory Visit (INDEPENDENT_AMBULATORY_CARE_PROVIDER_SITE_OTHER): Payer: Medicare Other | Admitting: Urology

## 2021-06-23 VITALS — BP 126/76 | HR 80 | Ht 72.0 in | Wt 180.0 lb

## 2021-06-23 DIAGNOSIS — R339 Retention of urine, unspecified: Secondary | ICD-10-CM | POA: Diagnosis not present

## 2021-06-23 NOTE — Progress Notes (Signed)
? ?06/23/2021 ?1:27 PM  ? ?Ronald Santiago ?1945/06/23 ?[id number] ? ?Referring provider: Mick Sell, MD ?[address] ?[place],  [place] [address] ? ?Chief Complaint  ?Patient presents with  ? Hydronephrosis  ? ? ?HPI: ?I was consulted to assess the patient who has a chronic suprapubic tube since December 2020 placed in [place].  Home health changes it uneventfully monthly.  On March 29 when it was changed the patient had blood from the penis blood in the urine and pain.  The bleeding gradually settle.  Bleeding came back April 4 and went to the emergency room.  He had a CT scan was placed on Levaquin which is now finished.  With certain movements or deep breath he feels some burning at the tip of the penis.  Otherwise he is back to baseline.  Sometimes his abdomen gets distended and then it goes down.  The catheter may or may not be draining well at the time but overall it appears to be draining well. ?  ?Patient is wheelchair-bound due to progressive multiple sclerosis.  He has chronic sacral wounds.  He went to the emergency room with abdominal pain and blood from the penis.  CT scan demonstrated suprapubic catheter in place.  Mild bladder wall thickening.  No overdistended.  He had a positive urine culture sensitive to ciprofloxacin and no sensitivity done on Levaquin.  No other oral antibiotics sensitive to   ?  ?Patient is doing well and should continue with suprapubic catheter changes.  Discomfort at tip of penis may be from residual cystitis with referred discomfort.  Because of the sensitivity I gave ciprofloxacin for another week.  We sent a urine for culture recognizing he could be colonized.  Recheck patient again in 6 months. ?  ?Today ?On January 24, 2021 he went to the emergency room with a clogged suprapubic catheter.  This occurs soon after nurse to change the tube. Wife irrigated unsuccessfully.  Clinically never had UTI symptoms.  Had a positive culture.  Is just finishing Augmentin.   Currently is asymptomatic in regards to any infection.  Tube is draining well.  Still on oxybutynin.  Had a normal CT scan April 2022 and [facility] recommended annual ultrasound ? ?I did not want to retreat the positive culture to cause resistance.  He is asymptomatic.  I will see him in 6 months with a annual renal ultrasound.  90x3 oxybutynin sent to pharmacy.  Continue to change suprapubic tube monthly and irrigate at least once a week by his wife   ? ?Patient saw Dr. Alvester Morin May 11, 2021.  I read his note and he was able to negotiate a suprapubic tube into the bladder.  He placed a 16 French silicone catheter into the bladder.  It was secured with nylon stitch. ? ?The patient has a home health nurse to look after his sacral ulcers once a week and change the suprapubic once a month.  It clogged up 2 weeks ago and his wife changed it ? ?He had a CT scan April 10, 2021 and his kidneys were within normal limits ? ?PMH: ?Past Medical History:  ?Diagnosis Date  ? Chronic kidney disease   ? Multiple sclerosis, primary progressive (HCC)   ? ? ?Surgical History: ?Past Surgical History:  ?Procedure Laterality Date  ? BACK SURGERY    ? CHOLECYSTECTOMY    ? EYE SURGERY    ? ? ?Home Medications:  ?Allergies as of 06/23/2021   ? ?   Reactions  ?  Cephalexin Anaphylaxis, Hives  ? ?  ? ?  ?Medication List  ?  ? ?  ? Accurate as of June 23, 2021  1:27 PM. If you have any questions, ask your nurse or doctor.  ?  ?  ? ?  ? ?baclofen 20 MG tablet ?Commonly known as: LIORESAL ?Take 20-40 mg by mouth 3 (three) times daily. ?  ?cetirizine 10 MG tablet ?Commonly known as: ZYRTEC ?Take 10 mg by mouth daily. ?  ?clonazePAM 1 MG tablet ?Commonly known as: KLONOPIN ?Take 1 tablet by mouth 2 (two) times daily as needed. ?  ?D3-1000 PO ?Take 1,000 Units by mouth daily. ?  ?DSS 100 MG Caps ?Take 300 mg by mouth at bedtime. ?  ?gabapentin 600 MG tablet ?Commonly known as: NEURONTIN ?Take 1,200 mg by mouth 3 (three) times daily. ?  ?magnesium oxide  400 MG tablet ?Commonly known as: MAG-OX ?Take 1 tablet by mouth daily. ?  ?Movantik 25 MG Tabs tablet ?Generic drug: naloxegol oxalate ?Take 25 mg by mouth daily. ?  ?nystatin powder ?Commonly known as: MYCOSTATIN/NYSTOP ?SMARTSIG:1 Application Topical 2-3 Times Daily ?  ?oxybutynin 10 MG 24 hr tablet ?Commonly known as: DITROPAN-XL ?Take 10 mg by mouth 3 (three) times daily. ?  ?oxyCODONE 5 MG immediate release tablet ?Commonly known as: Oxy IR/ROXICODONE ?Take 5 mg by mouth every 4 (four) hours as needed for moderate pain or severe pain. ?  ?PROBIOTIC-10 PO ?Take by mouth. ?  ?psyllium 95 % Pack ?Commonly known as: HYDROCIL/METAMUCIL ?Take 1 packet by mouth daily. ?  ?Pumpkin Seed 500 MG Caps ?Take 1,000 mg by mouth daily. ?  ? ?  ? ? ?Allergies:  ?Allergies  ?Allergen Reactions  ? Cephalexin Anaphylaxis and Hives  ? ? ?Family History: ?History reviewed. No pertinent family history. ? ?Social History:  reports that he has been smoking. He has never used smokeless tobacco. No history on file for alcohol use and drug use. ? ?ROS: ?  ? ?  ? ?  ? ?  ? ?  ? ?  ? ?  ? ?  ? ?  ? ?  ? ?  ? ?  ? ?  ? ?Physical Exam: ?BP 126/76   Pulse 80   Ht 6' (1.829 m)   Wt 81.6 kg   BMI 24.41 kg/m?   ?Constitutional:  Alert and oriented, No acute distress. ?HEENT: [place] AT, moist mucus membranes.  Trachea midline, no masses. ? ? ?Laboratory Data: ?Lab Results  ?Component Value Date  ? WBC 6.7 04/16/2021  ? HGB 14.1 04/16/2021  ? HCT 41.4 04/16/2021  ? MCV 91.6 04/16/2021  ? PLT 235 04/16/2021  ? ? ?Lab Results  ?Component Value Date  ? CREATININE 0.68 04/19/2021  ? ? ?No results found for: PSA ? ?No results found for: TESTOSTERONE ? ?No results found for: HGBA1C ? ?Urinalysis ?   ?Component Value Date/Time  ? COLORURINE YELLOW (A) 04/10/2021 1155  ? APPEARANCEUR CLOUDY (A) 04/10/2021 1155  ? LABSPEC 1.020 04/10/2021 1155  ? PHURINE 9.0 (H) 04/10/2021 1155  ? GLUCOSEU NEGATIVE 04/10/2021 1155  ? HGBUR NEGATIVE 04/10/2021 1155  ?  BILIRUBINUR NEGATIVE 04/10/2021 1155  ? KETONESUR 20 (A) 04/10/2021 1155  ? PROTEINUR >=300 (A) 04/10/2021 1155  ? NITRITE NEGATIVE 04/10/2021 1155  ? LEUKOCYTESUR LARGE (A) 04/10/2021 1155  ? ? ?Pertinent Imaging: ? ? ?Assessment & Plan: Continue with monthly protocol.  He agreed.  I will see in a year. ? ?There are no diagnoses  linked to this encounter. ? ?No follow-ups on file. ? ?Martina Sinner, MD ? ?[facility] ?[address] ?[place], [place] [address] ?[phone] ?  ?

## 2021-08-04 ENCOUNTER — Ambulatory Visit: Payer: Medicare Other | Admitting: Urology

## 2021-11-19 ENCOUNTER — Telehealth: Payer: Self-pay | Admitting: Urology

## 2021-11-19 MED ORDER — RENACIDIN IR SOLN: 1 refills | Status: DC

## 2021-11-19 NOTE — Telephone Encounter (Signed)
Ronald Santiago from [employer] [phone]) called the triage line today and left a voice mail message.   Patient is experiencing heavy sediment in the SPT that is requiring daily irrigation.  Ronald Santiago is requesting an order for acidic acid flush or increased order for catheter changes.  Please advise.

## 2021-11-19 NOTE — Telephone Encounter (Signed)
[place] informed, sent in Renacidin to [facility] for irrigation BID. Voiced understanding.

## 2022-05-25 ENCOUNTER — Telehealth: Payer: Self-pay | Admitting: Urology

## 2022-05-25 NOTE — Telephone Encounter (Signed)
Patient's wife called regarding appointment on 07/13/22. Patient has been confined to bed indefinitely due to several falls he has had. She is aware that he needs a yearly RUS, and asked if there is a way for a mobile unit to come to their home to do this. Also, if Dr. Matilde Sprang will do a virtual/telephone visit? Patient is not able to transfer to wheelchair at this time, and can only be transported by ambulance. Appointment will need to be cancelled if not able to do remotely. Please advise patient's wife.

## 2022-05-26 NOTE — Telephone Encounter (Signed)
.  left message to have patient return my call.   Bjorn Loser, MD  to Me     05/25/22  3:21 PM Lets not do the ultrasound There is no portable unit to send Virtual visit is fine with me

## 2022-05-29 ENCOUNTER — Telehealth: Payer: Self-pay

## 2022-05-29 NOTE — Telephone Encounter (Signed)
Pts wife [name] aware.   Appt changed to my chart video visit.   Email sent to set up my chart. Instructions mailed for video visit.

## 2022-06-29 ENCOUNTER — Ambulatory Visit: Payer: Medicare Other | Admitting: Urology

## 2022-07-13 ENCOUNTER — Telehealth (INDEPENDENT_AMBULATORY_CARE_PROVIDER_SITE_OTHER): Payer: Medicare Other | Admitting: Urology

## 2022-07-13 ENCOUNTER — Encounter: Payer: Self-pay | Admitting: Urology

## 2022-07-13 VITALS — Ht 72.0 in | Wt 175.0 lb

## 2022-07-13 DIAGNOSIS — R339 Retention of urine, unspecified: Secondary | ICD-10-CM | POA: Diagnosis not present

## 2022-07-13 MED ORDER — OXYBUTYNIN CHLORIDE ER 10 MG PO TB24
10.0000 mg | ORAL_TABLET | Freq: Three times a day (TID) | ORAL | 3 refills | Status: AC
Start: 2022-07-13 — End: 2023-07-10

## 2022-07-13 NOTE — Progress Notes (Addendum)
Patient ID: [name], male   DOB: [birth year], 77 y.o.   MRN: [id number]  This service is provided via mychart video   No vital signs collected/recorded due to the encounter was a mychart video visit.     Patient consents to a mychart video visit:  Yes.  The patient was home at his home address.  I was in urology clinic in [place]   The patient is doing very well with the suprapubic tube.  Because it clogs up the change it every 2 weeks with home health.Because it clogs up the change it every 2 weeks with home health.  Clinically no infections.  His wife irrigates it daily.  He is on oxybutynin ER and I will renew it 90 tablets x 3 and we will see him in a year.  Clean not infected.  Incomplete bladder emptying stable   Names of all persons participating in the mychart video visit service and their role in the encounter:  Patient Ronald Santiago, wife Angelique Blonder, and provider Alfredo Martinez, MD

## 2022-07-13 NOTE — Addendum Note (Signed)
Addended by: Sueanne Margarita on: 07/13/2022 02:10 PM   Modules accepted: Orders

## 2022-08-29 IMAGING — CT CT CYSTOGRAM
2 of 7 series · 15 of 46 positions shown, 17 images · IV contrast (APPLIED)
Comparison: Same day noncontrast CT abdomen pelvis

CLINICAL DATA: Hematuria

EXAM:
CT CYSTOGRAM (CT ABDOMEN AND PELVIS WITH CONTRAST)
TECHNIQUE: Multi-detector CT imaging through the abdomen and pelvis was
performed after dilute contrast had been introduced into the bladder
for the purposes of performing CT cystography.
CONTRAST:  100mL OMNIPAQUE IOHEXOL 300 MG/ML  SOLN

[Series 2: cystogram · axial · 0.94mm/px · z∈[-589,-94]mm · 12 of 115 slices shown, 14 images]
[im 8/115  soft-tissue]
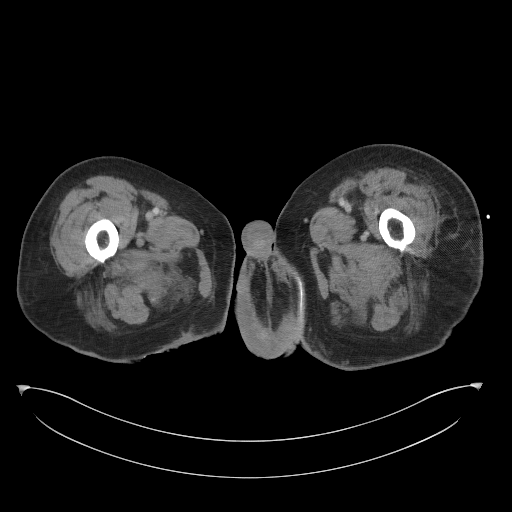
[im 8/115  bone]
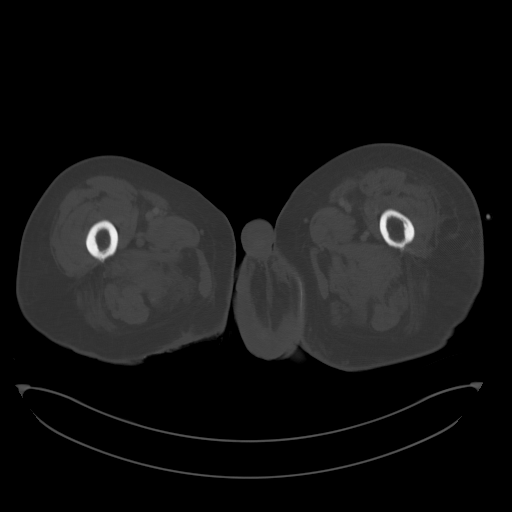
[im 16/115  soft-tissue]
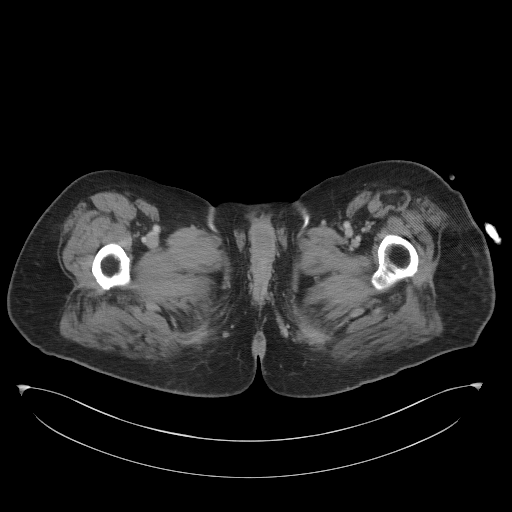
[im 23/115  soft-tissue]
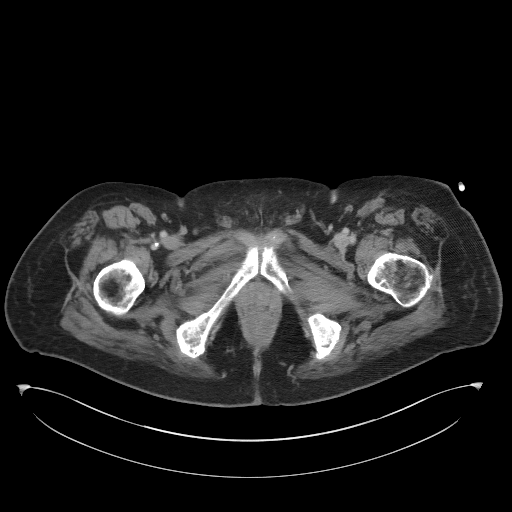
[im 39/115  soft-tissue]
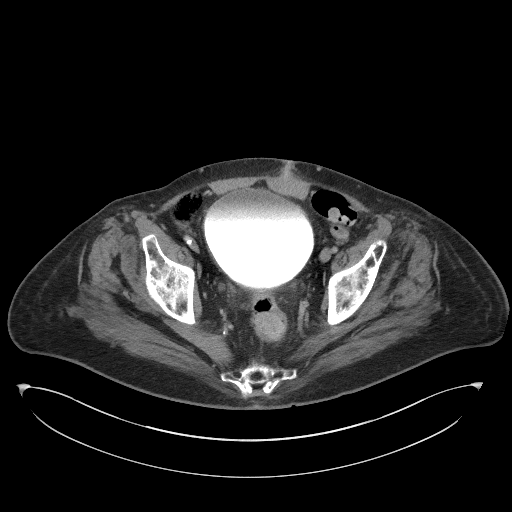
[im 46/115  soft-tissue]
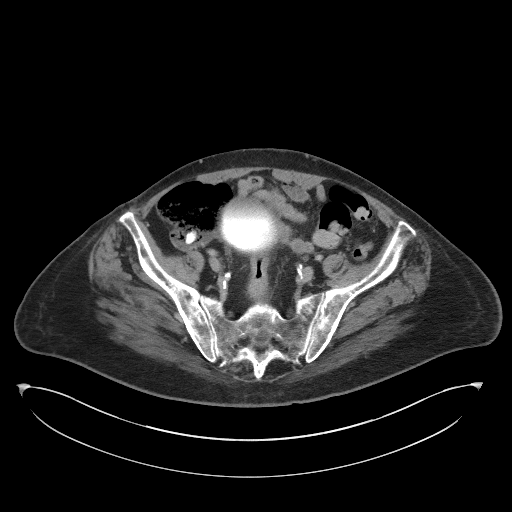
[im 54/115  soft-tissue]
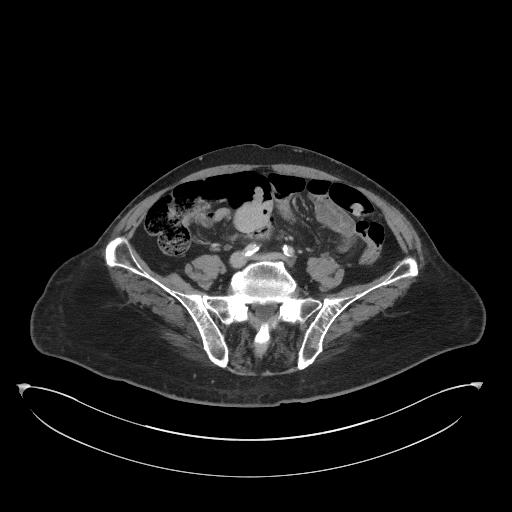
[im 61/115  soft-tissue]
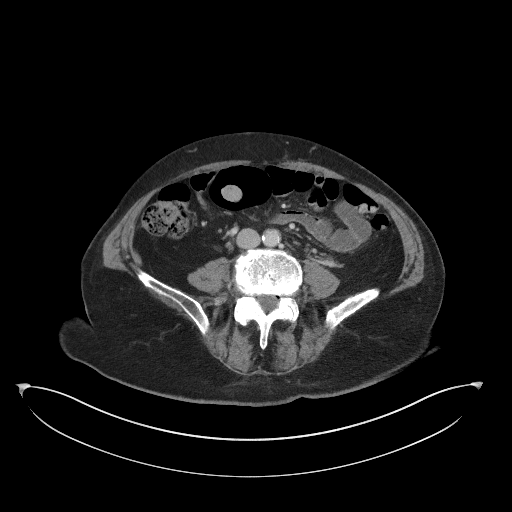
[im 69/115  soft-tissue]
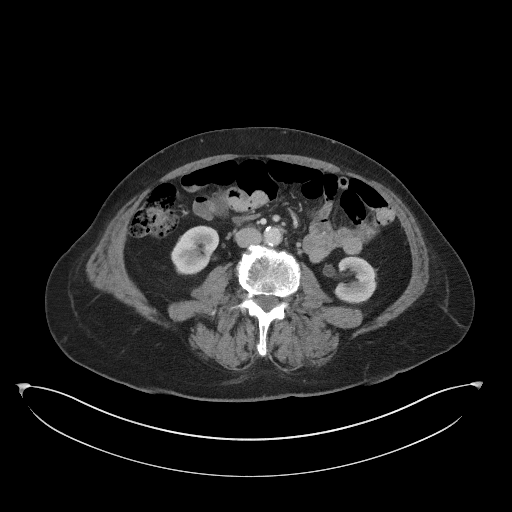
[im 77/115  soft-tissue]
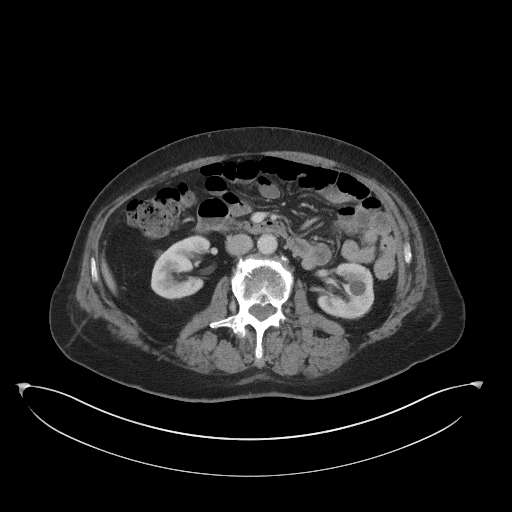
[im 77/115  bone]
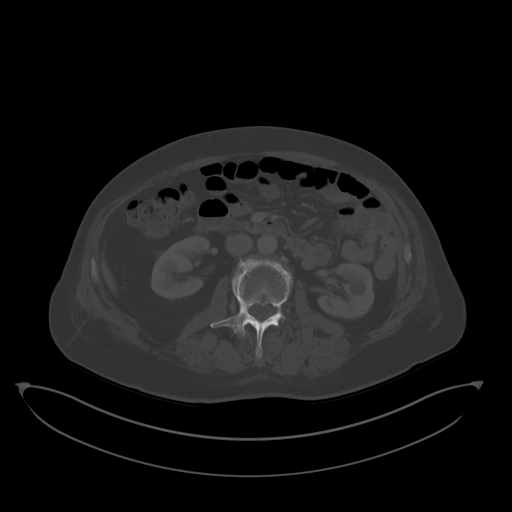
[im 92/115  soft-tissue]
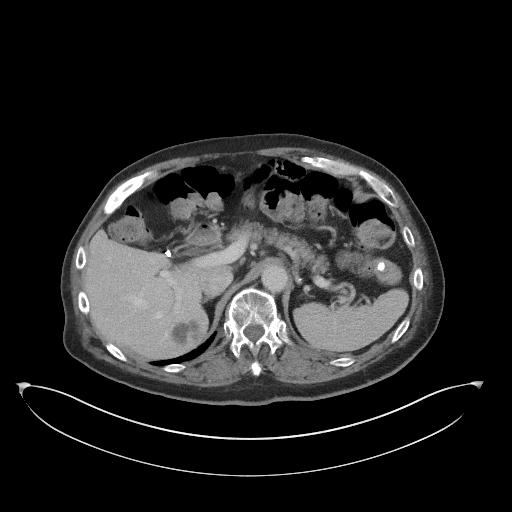
[im 99/115  soft-tissue]
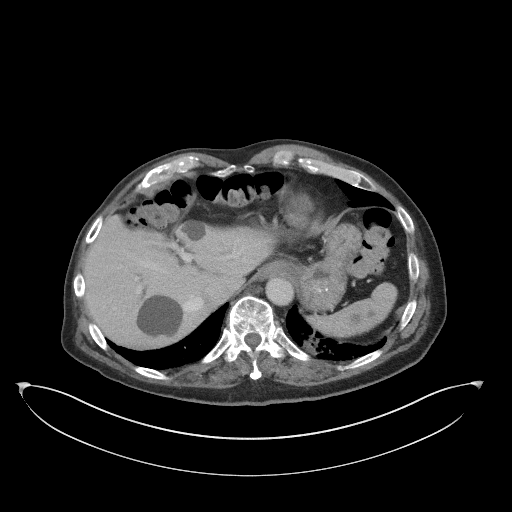
[im 107/115  soft-tissue]
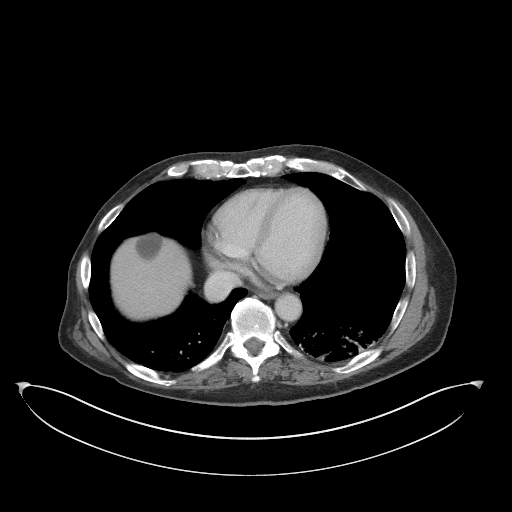

[Series 5: coronal · coronal · 0.70mm/px · 3 of 88 slices shown]
[im 30/88  soft-tissue]
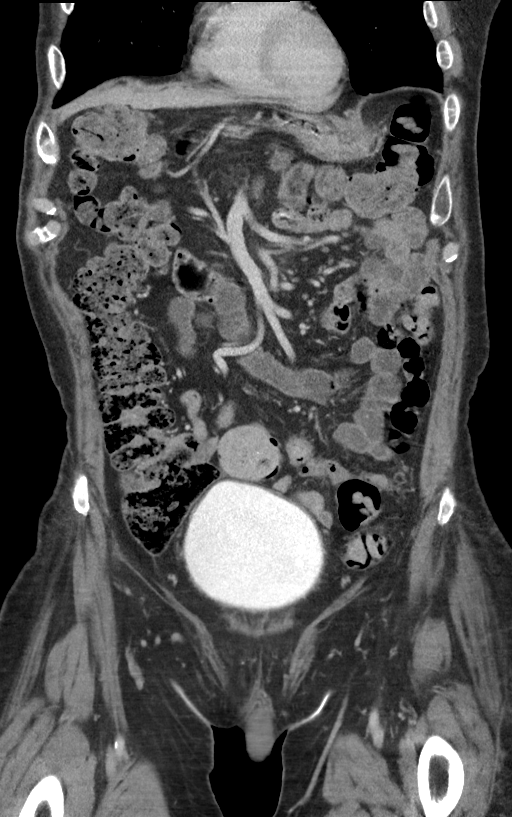
[im 39/88  soft-tissue]
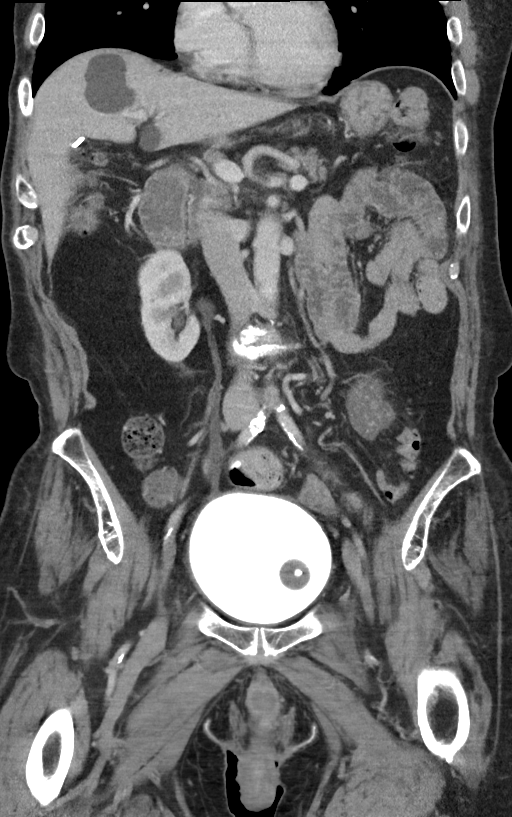
[im 49/88  soft-tissue]
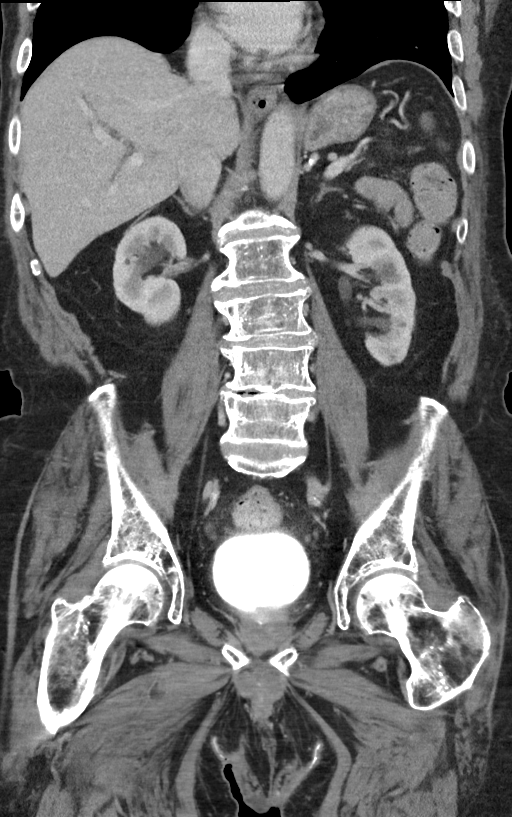

[15 of 46 positions shown; findings below may reference images not displayed]

FINDINGS: Lower chest: Dependent bibasilar scarring and or atelectasis.

Hepatobiliary: Multiple liver cysts. Status post cholecystectomy. No
biliary dilatation.

Pancreas: Unremarkable. No pancreatic ductal dilatation or
surrounding inflammatory changes.

Spleen: Normal in size without significant abnormality.

Adrenals/Urinary Tract: Adrenal glands are unremarkable. Kidneys are
normal, without renal calculi, solid lesion, or hydronephrosis. The
urinary bladder is filled by contrast, and contains a suprapubic
catheter. No evidence of urinary tract filling defect.

Stomach/Bowel: Stomach is within normal limits. Appendix appears
normal. No evidence of bowel wall thickening, distention, or
inflammatory changes.

Vascular/Lymphatic: Aortic atherosclerosis. No enlarged abdominal or
pelvic lymph nodes.

Reproductive: No mass or other significant abnormality.

Other: No abdominal wall hernia or abnormality. No abdominopelvic
ascites.

Musculoskeletal: No acute or significant osseous findings.
IMPRESSION: 1. No CT findings to explain hematuria. No evidence of urinary tract
calculus, mass, or hydronephrosis.
2. The urinary bladder is filled by contrast, and contains a
suprapubic catheter. No evidence of significant bladder abnormality
or filling defect.

Aortic Atherosclerosis (E0R7I-QPR.R).

## 2022-08-29 IMAGING — CT CT RENAL STONE PROTOCOL
2 of 4 series · 16 of 46 positions shown, 18 images · non-contrast
Comparison: None.

CLINICAL DATA: Hematuria

EXAM:
CT ABDOMEN AND PELVIS WITHOUT CONTRAST
TECHNIQUE: Multidetector CT imaging of the abdomen and pelvis was performed
following the standard protocol without IV contrast.

[Series 2: stone full standard · axial · 0.76mm/px · z∈[-558,-118]mm · 13 of 98 slices shown, 15 images]
[im 5/98  soft-tissue]
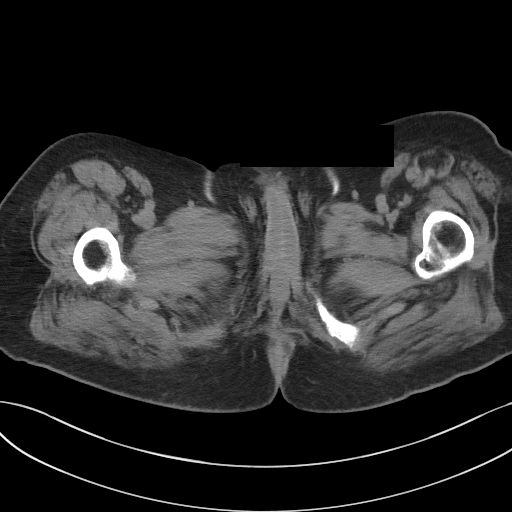
[im 5/98  bone]
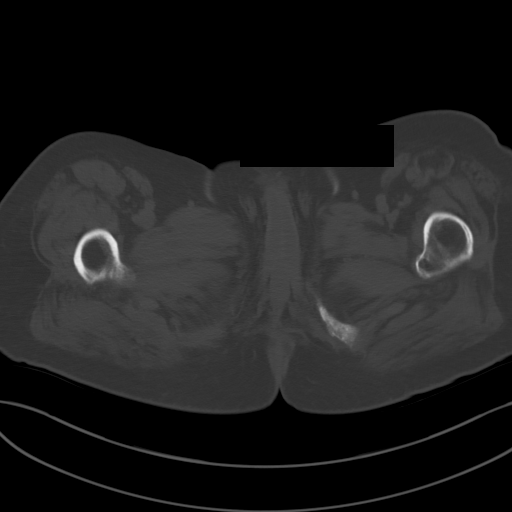
[im 13/98  soft-tissue]
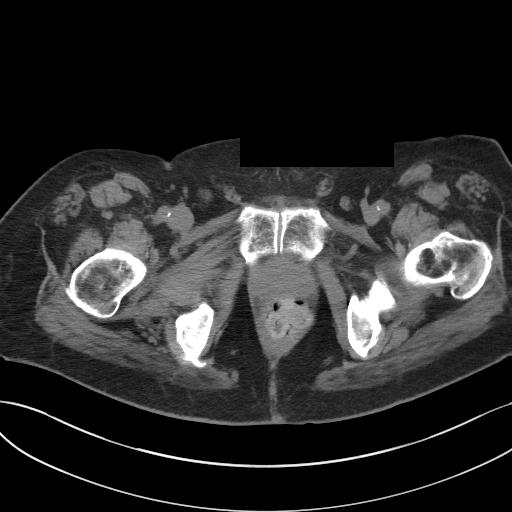
[im 22/98  soft-tissue]
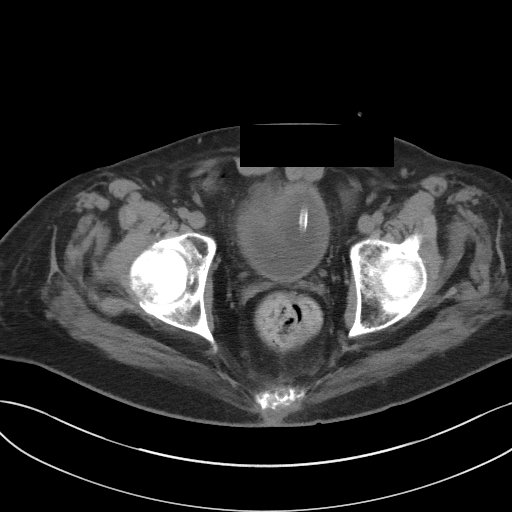
[im 26/98  soft-tissue]
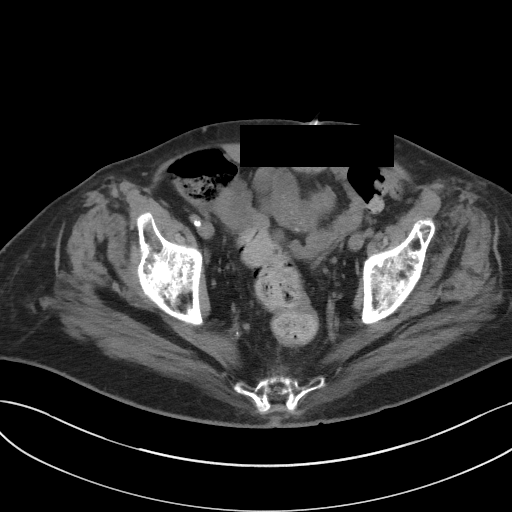
[im 34/98  soft-tissue]
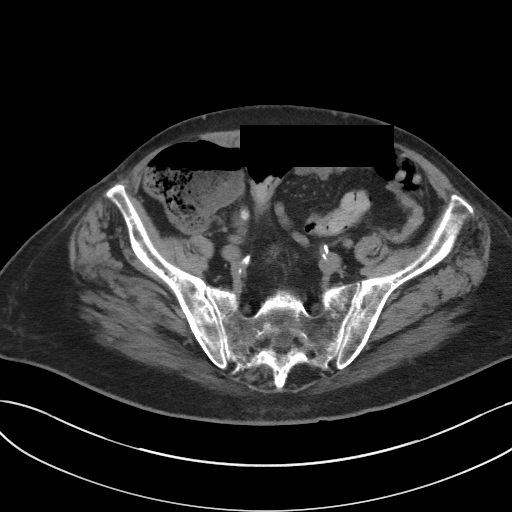
[im 43/98  soft-tissue]
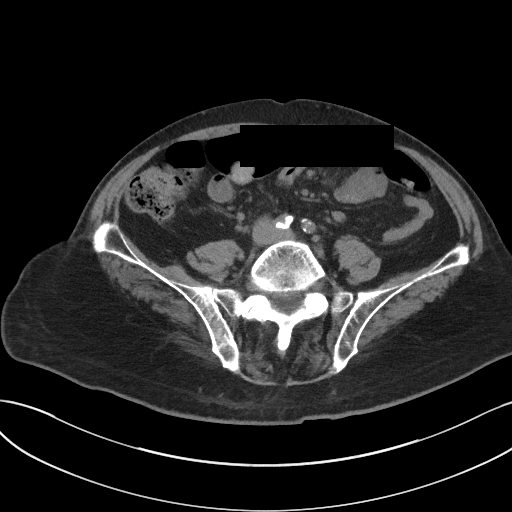
[im 51/98  soft-tissue]
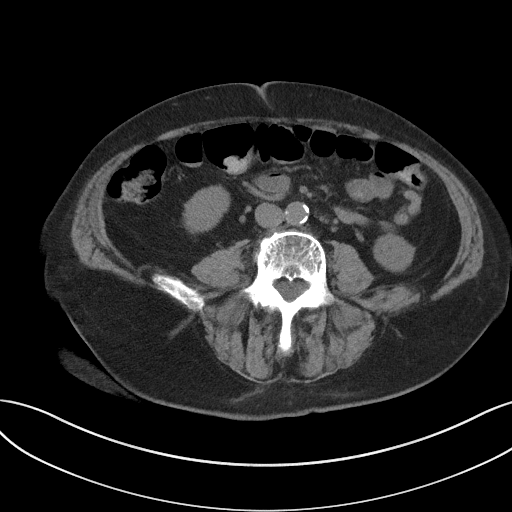
[im 55/98  soft-tissue]
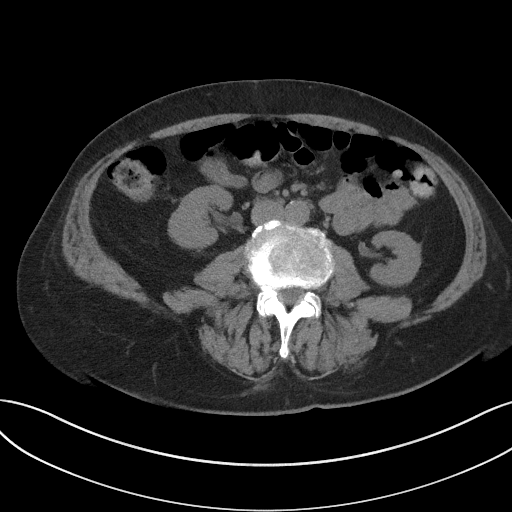
[im 64/98  soft-tissue]
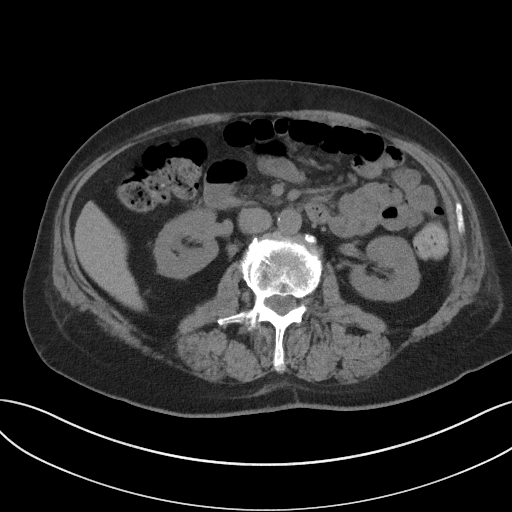
[im 64/98  bone]
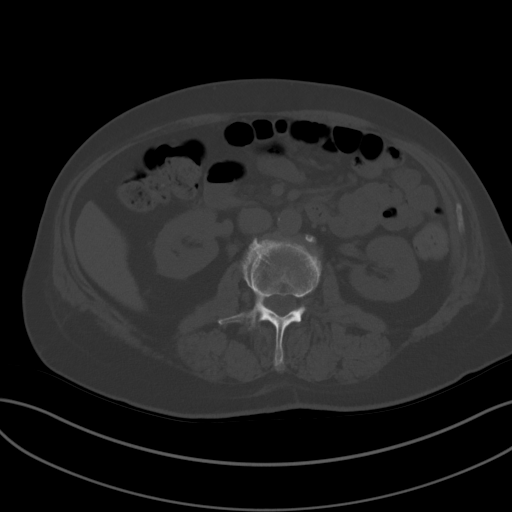
[im 72/98  soft-tissue]
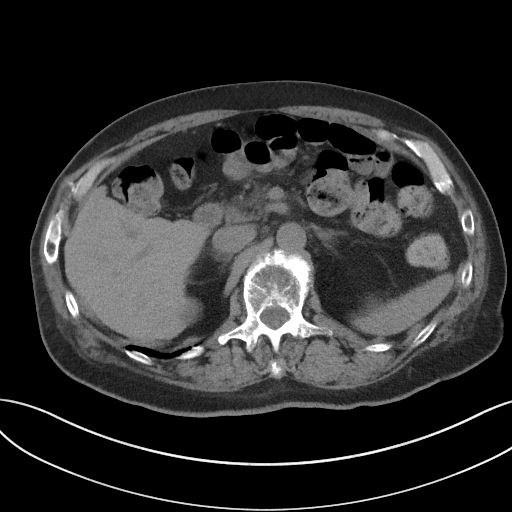
[im 76/98  soft-tissue]
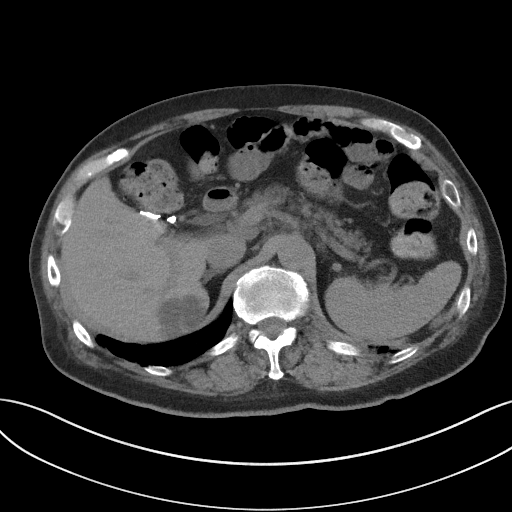
[im 85/98  soft-tissue]
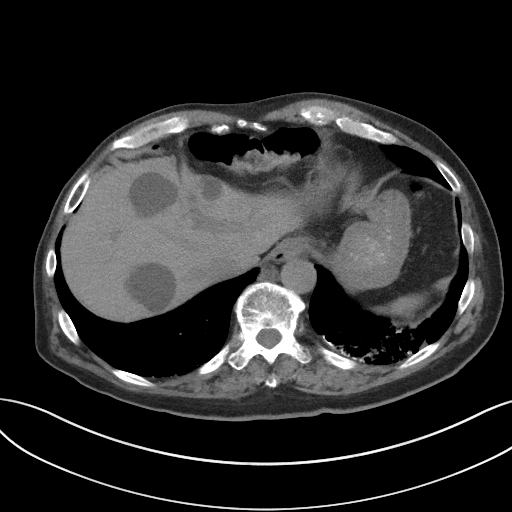
[im 93/98  soft-tissue]
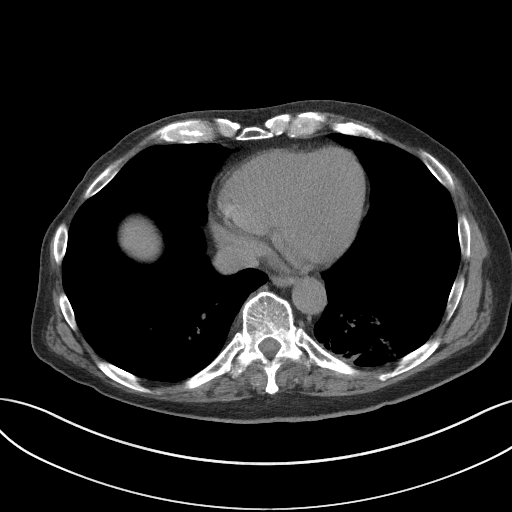

[Series 5: coronal · coronal · 0.82mm/px · 3 of 134 slices shown]
[im 45/134  soft-tissue]
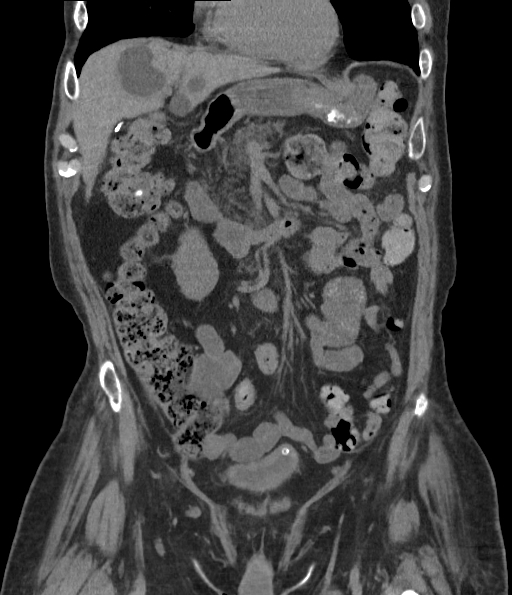
[im 60/134  soft-tissue]
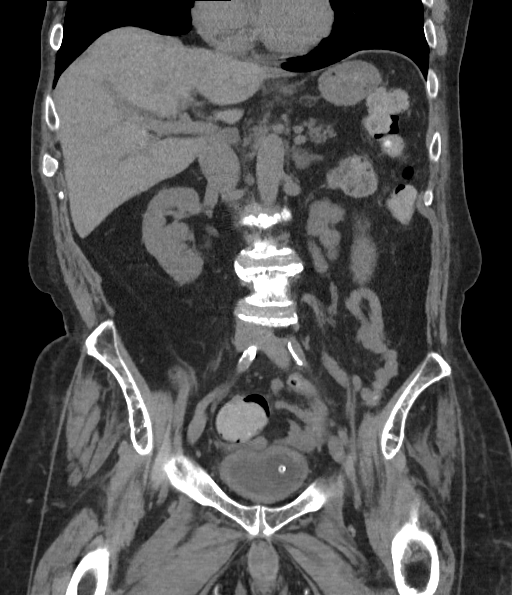
[im 74/134  soft-tissue]
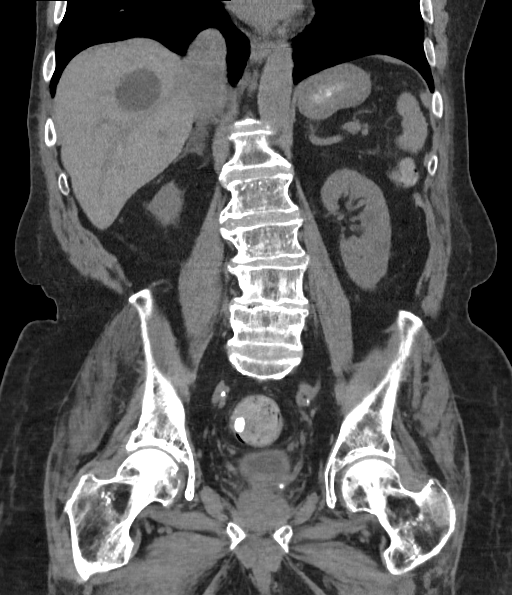

[16 of 46 positions shown; findings below may reference images not displayed]

FINDINGS: Lower chest: Left basilar airspace opacity. Peribronchial thickening
in the left lung base. Appearance is concerning for
bronchopneumonia. No effusions.

Hepatobiliary: Numerous cysts within the liver. Prior
cholecystectomy. No biliary ductal dilatation.

Pancreas: No focal abnormality or ductal dilatation.

Spleen: Low-density lesion in the superior aspect of the spleen
measures 14 mm. 9 mm low-density lesion in the inferior aspect of
the spleen. Normal size.

Adrenals/Urinary Tract: Suprapubic catheter in place. Bladder wall
appears mildly thickened. No hydronephrosis or stones. Adrenal
glands unremarkable.

Stomach/Bowel: Moderate stool burden throughout the colon. Normal
appendix. Stomach and small bowel decompressed.

Vascular/Lymphatic: Aortoiliac atherosclerosis. No evidence of
aneurysm or adenopathy.

Reproductive: No visible focal abnormality.

Other: No free fluid or free air.

Musculoskeletal: No acute bony abnormality.
IMPRESSION: Suprapubic catheter in place. Mild bladder wall thickening which
could be related to bladder outlet obstruction or cystitis.
Recommend clinical correlation. No hydronephrosis or stones.

Numerous hepatic cysts.

Low-density lesions within the spleen, nonspecific. Statistically,
these most likely reflects cysts or hemangiomas.

Large stool burden throughout the colon.

Airspace disease in the left lower lobe.  Cannot exclude pneumonia.

## 2022-10-01 NOTE — Telephone Encounter (Signed)
Error

## 2022-11-30 ENCOUNTER — Inpatient Hospital Stay
Admission: EM | Admit: 2022-11-30 | Discharge: 2022-12-14 | DRG: 177 | Disposition: A | Discharge status: Home Health | Payer: Medicare Other | Attending: Internal Medicine | Admitting: Internal Medicine

## 2022-11-30 ENCOUNTER — Emergency Department: Payer: Medicare Other

## 2022-11-30 ENCOUNTER — Other Ambulatory Visit: Payer: Self-pay

## 2022-11-30 DIAGNOSIS — D72829 Elevated white blood cell count, unspecified: Secondary | ICD-10-CM

## 2022-11-30 DIAGNOSIS — G35D Multiple sclerosis, unspecified: Secondary | ICD-10-CM | POA: Diagnosis present

## 2022-11-30 DIAGNOSIS — J90 Pleural effusion, not elsewhere classified: Secondary | ICD-10-CM | POA: Diagnosis present

## 2022-11-30 DIAGNOSIS — R918 Other nonspecific abnormal finding of lung field: Secondary | ICD-10-CM

## 2022-11-30 DIAGNOSIS — H6123 Impacted cerumen, bilateral: Secondary | ICD-10-CM | POA: Diagnosis present

## 2022-11-30 DIAGNOSIS — J9601 Acute respiratory failure with hypoxia: Secondary | ICD-10-CM

## 2022-11-30 DIAGNOSIS — Z79899 Other long term (current) drug therapy: Secondary | ICD-10-CM

## 2022-11-30 DIAGNOSIS — L8994 Pressure ulcer of unspecified site, stage 4: Secondary | ICD-10-CM | POA: Diagnosis present

## 2022-11-30 DIAGNOSIS — G35B0 Primary progressive multiple sclerosis, unspecified: Secondary | ICD-10-CM | POA: Diagnosis present

## 2022-11-30 DIAGNOSIS — J851 Abscess of lung with pneumonia: Secondary | ICD-10-CM | POA: Diagnosis not present

## 2022-11-30 DIAGNOSIS — R059 Cough, unspecified: Secondary | ICD-10-CM

## 2022-11-30 DIAGNOSIS — E871 Hypo-osmolality and hyponatremia: Secondary | ICD-10-CM

## 2022-11-30 DIAGNOSIS — A419 Sepsis, unspecified organism: Secondary | ICD-10-CM

## 2022-11-30 DIAGNOSIS — G35 Multiple sclerosis: Secondary | ICD-10-CM | POA: Diagnosis present

## 2022-11-30 DIAGNOSIS — Z9359 Other cystostomy status: Secondary | ICD-10-CM

## 2022-11-30 DIAGNOSIS — N312 Flaccid neuropathic bladder, not elsewhere classified: Secondary | ICD-10-CM | POA: Diagnosis present

## 2022-11-30 DIAGNOSIS — Z7401 Bed confinement status: Secondary | ICD-10-CM

## 2022-11-30 DIAGNOSIS — Z66 Do not resuscitate: Secondary | ICD-10-CM | POA: Diagnosis present

## 2022-11-30 DIAGNOSIS — J869 Pyothorax without fistula: Secondary | ICD-10-CM

## 2022-11-30 DIAGNOSIS — R1310 Dysphagia, unspecified: Secondary | ICD-10-CM | POA: Diagnosis present

## 2022-11-30 DIAGNOSIS — K5900 Constipation, unspecified: Secondary | ICD-10-CM | POA: Insufficient documentation

## 2022-11-30 DIAGNOSIS — N3289 Other specified disorders of bladder: Secondary | ICD-10-CM | POA: Diagnosis present

## 2022-11-30 DIAGNOSIS — G894 Chronic pain syndrome: Secondary | ICD-10-CM | POA: Diagnosis present

## 2022-11-30 DIAGNOSIS — Z1152 Encounter for screening for COVID-19: Secondary | ICD-10-CM

## 2022-11-30 DIAGNOSIS — Z881 Allergy status to other antibiotic agents status: Secondary | ICD-10-CM

## 2022-11-30 DIAGNOSIS — E860 Dehydration: Secondary | ICD-10-CM | POA: Diagnosis present

## 2022-11-30 DIAGNOSIS — D631 Anemia in chronic kidney disease: Secondary | ICD-10-CM | POA: Diagnosis present

## 2022-11-30 DIAGNOSIS — D72828 Other elevated white blood cell count: Secondary | ICD-10-CM | POA: Diagnosis present

## 2022-11-30 DIAGNOSIS — J189 Pneumonia, unspecified organism: Principal | ICD-10-CM

## 2022-11-30 DIAGNOSIS — L89154 Pressure ulcer of sacral region, stage 4: Secondary | ICD-10-CM | POA: Diagnosis present

## 2022-11-30 DIAGNOSIS — N319 Neuromuscular dysfunction of bladder, unspecified: Secondary | ICD-10-CM | POA: Diagnosis present

## 2022-11-30 HISTORY — DX: Acute respiratory failure with hypoxia: J96.01

## 2022-11-30 LAB — BASIC METABOLIC PANEL
Anion gap: 11 (ref 5–15)
BUN: 11 mg/dL (ref 8–23)
CO2: 25 mmol/L (ref 22–32)
Calcium: 8.5 mg/dL — ABNORMAL LOW (ref 8.9–10.3)
Chloride: 92 mmol/L — ABNORMAL LOW (ref 98–111)
Creatinine, Ser: 0.46 mg/dL — ABNORMAL LOW (ref 0.61–1.24)
GFR, Estimated: >60 mL/min (ref >60)
Glucose, Bld: 110 mg/dL — ABNORMAL HIGH (ref 70–99)
Potassium: 4 mmol/L (ref 3.5–5.1)
Sodium: 128 mmol/L — ABNORMAL LOW (ref 135–145)

## 2022-11-30 LAB — CBC
HCT: 37 % — ABNORMAL LOW (ref 39.0–52.0)
Hemoglobin: 12 g/dL — ABNORMAL LOW (ref 13.0–17.0)
MCH: 29.1 pg (ref 26.0–34.0)
MCHC: 32.4 g/dL (ref 30.0–36.0)
MCV: 89.8 fL (ref 80.0–100.0)
Platelets: 524 10*3/uL — ABNORMAL HIGH (ref 150–400)
RBC: 4.12 MIL/uL — ABNORMAL LOW (ref 4.22–5.81)
RDW: 14.6 % (ref 11.5–15.5)
WBC: 16.8 10*3/uL — ABNORMAL HIGH (ref 4.0–10.5)
nRBC: 0 % (ref 0.0–0.2)

## 2022-11-30 LAB — URINALYSIS, W/ REFLEX TO CULTURE (INFECTION SUSPECTED)
Bilirubin Urine: NEGATIVE
Glucose, UA: NEGATIVE mg/dL
Hgb urine dipstick: NEGATIVE
Ketones, ur: 5 mg/dL — AB
Nitrite: NEGATIVE
Protein, ur: NEGATIVE mg/dL
Specific Gravity, Urine: 1.045 — ABNORMAL HIGH (ref 1.005–1.030)
pH: 5 (ref 5.0–8.0)

## 2022-11-30 LAB — RESP PANEL BY RT-PCR (RSV, FLU A&B, COVID)  RVPGX2
Influenza A by PCR: NEGATIVE
Influenza B by PCR: NEGATIVE
Resp Syncytial Virus by PCR: NEGATIVE
SARS Coronavirus 2 by RT PCR: NEGATIVE

## 2022-11-30 LAB — TROPONIN I (HIGH SENSITIVITY)
Troponin I (High Sensitivity): 13 ng/L (ref <18)
Troponin I (High Sensitivity): 10 ng/L (ref <18)

## 2022-11-30 LAB — LACTIC ACID, PLASMA: Lactic Acid, Venous: 1.1 mmol/L (ref 0.5–1.9)

## 2022-11-30 MED ORDER — NALOXEGOL OXALATE 25 MG PO TABS
25.0000 mg | ORAL_TABLET | Freq: Every day | ORAL | Status: DC
  Administered 2022-12-01 – 2022-12-14 (×14): 25 mg via ORAL
  Filled 2022-11-30 (×14): qty 1

## 2022-11-30 MED ORDER — ENOXAPARIN SODIUM 40 MG/0.4ML IJ SOSY
40.0000 mg | PREFILLED_SYRINGE | INTRAMUSCULAR | Status: DC
  Administered 2022-11-30 – 2022-12-13 (×13): 40 mg via SUBCUTANEOUS
  Filled 2022-11-30 (×13): qty 0.4

## 2022-11-30 MED ORDER — OXYCODONE HCL 5 MG PO TABS
10.0000 mg | ORAL_TABLET | Freq: Three times a day (TID) | ORAL | Status: DC
  Administered 2022-11-30 – 2022-12-14 (×41): 10 mg via ORAL
  Filled 2022-11-30 (×42): qty 2

## 2022-11-30 MED ORDER — OXYBUTYNIN CHLORIDE ER 10 MG PO TB24: 10.0000 mg | ORAL_TABLET | Freq: Three times a day (TID) | ORAL | Status: DC

## 2022-11-30 MED ORDER — ACETAMINOPHEN 325 MG PO TABS
650.0000 mg | ORAL_TABLET | Freq: Four times a day (QID) | ORAL | Status: DC | PRN
  Administered 2022-12-02 – 2022-12-03 (×2): 650 mg via ORAL
  Filled 2022-11-30 (×2): qty 2

## 2022-11-30 MED ORDER — LEVOFLOXACIN IN D5W 750 MG/150ML IV SOLN: 750.0000 mg | Freq: Once | INTRAVENOUS | Status: DC

## 2022-11-30 MED ORDER — ONDANSETRON HCL 4 MG/2ML IJ SOLN: 4.0000 mg | Freq: Four times a day (QID) | INTRAMUSCULAR | Status: DC | PRN

## 2022-11-30 MED ORDER — SODIUM CHLORIDE 0.9 % IV SOLN
1.0000 g | INTRAVENOUS | Status: DC
  Administered 2022-11-30: 1 g via INTRAVENOUS
  Filled 2022-11-30: qty 10

## 2022-11-30 MED ORDER — GABAPENTIN 300 MG PO CAPS
1200.0000 mg | ORAL_CAPSULE | Freq: Three times a day (TID) | ORAL | Status: DC
  Administered 2022-11-30 – 2022-12-14 (×41): 1200 mg via ORAL
  Filled 2022-11-30 (×42): qty 4

## 2022-11-30 MED ORDER — SODIUM CHLORIDE 0.9 % IV SOLN
500.0000 mg | INTRAVENOUS | Status: AC
  Administered 2022-11-30 – 2022-12-04 (×5): 500 mg via INTRAVENOUS
  Filled 2022-11-30 (×5): qty 5

## 2022-11-30 MED ORDER — LACTATED RINGERS IV SOLN: INTRAVENOUS | Status: AC

## 2022-11-30 MED ORDER — HYDROCOD POLI-CHLORPHE POLI ER 10-8 MG/5ML PO SUER
5.0000 mL | Freq: Two times a day (BID) | ORAL | Status: DC | PRN
  Administered 2022-12-01 – 2022-12-12 (×13): 5 mL via ORAL
  Filled 2022-11-30 (×15): qty 5

## 2022-11-30 MED ORDER — DOCUSATE SODIUM 100 MG PO CAPS
400.0000 mg | ORAL_CAPSULE | Freq: Every day | ORAL | Status: DC
  Administered 2022-11-30 – 2022-12-01 (×2): 400 mg via ORAL
  Filled 2022-11-30 (×2): qty 4

## 2022-11-30 MED ORDER — BACLOFEN 10 MG PO TABS
40.0000 mg | ORAL_TABLET | Freq: Every day | ORAL | Status: DC
  Administered 2022-11-30 – 2022-12-13 (×14): 40 mg via ORAL
  Filled 2022-11-30 (×15): qty 4

## 2022-11-30 MED ORDER — SODIUM CHLORIDE 0.9% FLUSH
3.0000 mL | Freq: Two times a day (BID) | INTRAVENOUS | Status: DC
  Administered 2022-11-30 – 2022-12-11 (×21): 3 mL via INTRAVENOUS

## 2022-11-30 MED ORDER — LORATADINE 10 MG PO TABS
10.0000 mg | ORAL_TABLET | Freq: Every day | ORAL | Status: DC
  Administered 2022-12-01 – 2022-12-14 (×14): 10 mg via ORAL
  Filled 2022-11-30 (×14): qty 1

## 2022-11-30 MED ORDER — CLONAZEPAM 1 MG PO TABS
1.0000 mg | ORAL_TABLET | Freq: Every evening | ORAL | Status: DC | PRN
  Administered 2022-11-30 – 2022-12-12 (×10): 1 mg via ORAL
  Filled 2022-11-30 (×4): qty 1
  Filled 2022-11-30: qty 2
  Filled 2022-11-30 (×6): qty 1

## 2022-11-30 MED ORDER — IOHEXOL 300 MG/ML  SOLN
75.0000 mL | Freq: Once | INTRAMUSCULAR | Status: AC | PRN
  Administered 2022-11-30: 75 mL via INTRAVENOUS

## 2022-11-30 MED ORDER — BACLOFEN 10 MG PO TABS
20.0000 mg | ORAL_TABLET | Freq: Two times a day (BID) | ORAL | Status: DC
  Administered 2022-12-01 – 2022-12-14 (×26): 20 mg via ORAL
  Filled 2022-11-30 (×26): qty 2

## 2022-11-30 MED ORDER — ACETAMINOPHEN 650 MG RE SUPP: 650.0000 mg | Freq: Four times a day (QID) | RECTAL | Status: DC | PRN

## 2022-11-30 MED ORDER — POLYETHYLENE GLYCOL 3350 17 G PO PACK: 17.0000 g | PACK | Freq: Every day | ORAL | Status: DC | PRN

## 2022-11-30 MED ORDER — SODIUM CHLORIDE 0.9 % IV BOLUS (SEPSIS)
1000.0000 mL | Freq: Once | INTRAVENOUS | Status: AC
  Administered 2022-11-30: 1000 mL via INTRAVENOUS

## 2022-11-30 MED ORDER — ALBUTEROL SULFATE HFA 108 (90 BASE) MCG/ACT IN AERS: 2.0000 | INHALATION_SPRAY | RESPIRATORY_TRACT | Status: DC | PRN

## 2022-11-30 MED ORDER — MAGNESIUM OXIDE -MG SUPPLEMENT 400 (240 MG) MG PO TABS
400.0000 mg | ORAL_TABLET | Freq: Every day | ORAL | Status: DC
  Administered 2022-11-30 – 2022-12-14 (×15): 400 mg via ORAL
  Filled 2022-11-30 (×15): qty 1

## 2022-11-30 MED ORDER — ONDANSETRON HCL 4 MG PO TABS: 4.0000 mg | ORAL_TABLET | Freq: Four times a day (QID) | ORAL | Status: DC | PRN

## 2022-11-30 NOTE — ED Provider Notes (Signed)
[facility] Provider Note    Event Date/Time   First MD Initiated Contact with Patient 11/30/22 1921     (approximate)  History   Chief Complaint: Shortness of Breath  HPI  Ronald Santiago is a 77 y.o. male with a past medical history multiple sclerosis, CKD, presents to the emergency department for continued cough and shortness of breath.  According to the patient approximately 2 weeks ago he was diagnosed with pneumonia.  Wife states a home health nurse was able to get a portable chest x-ray which confirmed pneumonia the patient has been on 3 different rounds of antibiotics continues to have cough and shortness of breath.  Wife states the patient's pulse oximeter had been maintaining in the 90s until the past 2 days but has been dipping into the 80s.  85% per EMS, 89% on room air in the emergency department placed on 3 L nasal cannula no baseline O2 requirement.  Patient states continued cough continued fever including last night.  Patient is essentially bedbound at this point chronically per wife.  Physical Exam   Triage Vital Signs: ED Triage Vitals  Encounter Vitals Group     BP 11/30/22 1908 124/83     Systolic BP Percentile --      Diastolic BP Percentile --      Pulse Rate 11/30/22 1908 (!) 107     Resp 11/30/22 1908 20     Temp 11/30/22 1908 99 F (37.2 C)     Temp Source 11/30/22 1908 Oral     SpO2 11/30/22 1908 96 %     Weight 11/30/22 1910 170 lb (77.1 kg)     Height 11/30/22 1910 6' (1.829 m)     Head Circumference --      Peak Flow --      Pain Score 11/30/22 1909 5     Pain Loc --      Pain Education --      Exclude from Growth Chart --     Most recent vital signs: Vitals:   11/30/22 1908  BP: 124/83  Pulse: (!) 107  Resp: 20  Temp: 99 F (37.2 C)  SpO2: 96%    General: Awake, no distress.  CV:  Good peripheral perfusion.  Regular rate and rhythm  Resp:  Normal effort.  Equal breath sounds bilaterally.  Abd:  No distention.   Soft, nontender.  No rebound or guarding. Other:  No calf tenderness or significant edema.  Patient does have pressure ulceration to the left leg.   ED Results / Procedures / Treatments   EKG  EKG viewed and interpreted by myself shows sinus tachycardia 102 bpm with a narrow QRS, normal axis, normal intervals, nonspecific ST changes.  RADIOLOGY  I have reviewed and interpreted chest x-ray images.  Patient appears to have considerable consolidation to the right lung fields consistent with pneumonia.   MEDICATIONS ORDERED IN ED: Medications  sodium chloride 0.9 % bolus 1,000 mL (has no administration in time range)  levofloxacin (LEVAQUIN) IVPB 750 mg (has no administration in time range)     IMPRESSION / MDM / ASSESSMENT AND PLAN / ED COURSE  I reviewed the triage vital signs and the nursing notes.  Patient's presentation is most consistent with acute presentation with potential threat to life or bodily function.  Patient presents to the emergency department for worsening shortness of breath cough continued fever.  Patient is portable chest x-ray is consistent with right sided pneumonia.  Given the  patient's tachycardia hypoxia meeting sepsis criteria we will start broad-spectrum antibiotics to treat for pneumonia, check labs, cultures.  Patient will require admission to the hospital service once his emergency department workup is been completed given the patient's hypoxia.  Patient's labs resulted showing leukocytosis of 16,000, chemistry shows mild hyponatremia, troponin reassuringly negative, lactate reassuringly normal, COVID test is negative.  Given the patient's chest x-ray showing right-sided opacity with hypoxia and tachycardia as well as leukocytosis meeting sepsis criteria patient will be admitted to the hospital service for further workup and treatment  CRITICAL CARE Performed by: Minna Antis   Total critical care time: 30 minutes  Critical care time was exclusive  of separately billable procedures and treating other patients.  Critical care was necessary to treat or prevent imminent or life-threatening deterioration.  Critical care was time spent personally by me on the following activities: development of treatment plan with patient and/or surrogate as well as nursing, discussions with consultants, evaluation of patient's response to treatment, examination of patient, obtaining history from patient or surrogate, ordering and performing treatments and interventions, ordering and review of laboratory studies, ordering and review of radiographic studies, pulse oximetry and re-evaluation of patient's condition.   FINAL CLINICAL IMPRESSION(S) / ED DIAGNOSES  Sepsis Community-acquired pneumonia    Note:  This document was prepared using Dragon voice recognition software and may include unintentional dictation errors.   Minna Antis, MD 11/30/22 2128

## 2022-11-30 NOTE — Assessment & Plan Note (Signed)
Continue suprapubic catheter.

## 2022-11-30 NOTE — ED Triage Notes (Signed)
First Nurse Note: Patient to ED via [facility] from home for SOB and increased weakness. Patient dx with pneumonia 2 weeks ago- has taken 3 different po antibiotics. O2 initially 89% RA- placed on 3L [place] now at 96%. Hx of MS and bedbound

## 2022-11-30 NOTE — H&P (Addendum)
History and Physical    Patient: Ronald Santiago WUJ:[id number] DOB: 04/14/1945 DOA: 11/30/2022 DOS: the patient was seen and examined on 11/30/2022 PCP: Mick Sell, MD  Patient coming from: Home  Chief Complaint:  Chief Complaint  Patient presents with   Shortness of Breath   HPI: Ronald Santiago is a 77 y.o. 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  Mr. Soliven states that he is not feeling well at this time.  His cough has become gradually worse and quite bothersome.  He notes that occasionally, he feels that he may get some sputum up, however he is not able to cough it out.  Shortness of breath has developed gradually over the last 1 week.  He endorses intermittent fevers for the last 1 month as well.  He denies any night sweats or unintentional weight loss.  He denies any chest pain.  Per chart review, patient's wife contacted PCP on June 21 reporting fever and congestion at which time he was prescribed a 7-day course of doxycycline.  Then on 7/29, home health noted patient was coughing.  An x-ray was ordered but not performed.  At an office visit on 8/8, cough was ongoing and so patient was started on doxycycline again for 7-day course.  Chest x-ray was obtained on 8/16 that demonstrated pulmonary infiltrate in the right lower lobe.  Patient was started on levofloxacin for 7-day course.  Then on 9/6, physical therapy visited with patient and noted that his oxygen saturation was below 87% with difficulty increasing above.  Patient refused ED transfer at that time.  Mrs. Alkins states that patient generally has bowel movements every several days, with his last bowel movement approximately 5 days ago.  This is usual for him.  He denies any nausea or vomiting.  ED course: On  arrival to the ED, patient was normotensive at 124/83 with heart rate of 109.  He was saturating below 88% and was placed on 3 L with improvement to 96%.  He was afebrile at 99.  Initial workup demonstrating WBC of 16.8, hemoglobin 12.0, platelets of 524, sodium 128, glucose 110, creatinine 0.46 with GFR above 60.  Lactic acid within normal limits at 1.1.  Influenza, RSV and COVID-19 PCR negative.  Chest x-ray was obtained that demonstrated a large ovoid opacity in the right thorax.  CT of the chest was ordered.  TR[facility] contacted for admission.  Review of Systems: As mentioned in the history of present illness. All other systems reviewed and are negative.  Past Medical History:  Diagnosis Date   Chronic kidney disease    Multiple sclerosis, primary progressive (HCC)    Past Surgical History:  Procedure Laterality Date   BACK SURGERY     CHOLECYSTECTOMY     EYE SURGERY     Social History:  reports that he has never smoked. He has never been exposed to tobacco smoke. He has never used smokeless tobacco. He reports that he does not drink alcohol and does not use drugs.  Allergies  Allergen Reactions   Cephalexin Anaphylaxis and Hives    Pt tolerated several doses of cefepime during hospitalization in 03/2021    History reviewed. No pertinent family history.  Prior to Admission medications   Medication Sig Start Date End Date Taking? Authorizing Provider  baclofen (LIORESAL) 20 MG tablet  Take 20-40 mg by mouth 3 (three) times daily. 05/21/20   [provider]  cetirizine (ZYRTEC) 10 MG tablet Take 10 mg by mouth daily.    [provider]  Cholecalciferol (D3-1000 PO) Take 1,000 Units by mouth daily.    [provider]  gabapentin (NEURONTIN) 600 MG tablet Take 1,200 mg by mouth 3 (three) times daily. 02/14/19   [provider]  magnesium oxide (MAG-OX) 400 MG tablet Take 1 tablet by mouth daily.    [provider]  MOVANTIK 25 MG TABS tablet Take 25  mg by mouth daily. 05/21/20   [provider]  nystatin (MYCOSTATIN/NYSTOP) powder SMARTSIG:1 Application Topical 2-3 Times Daily 06/13/20   [provider]  oxybutynin (DITROPAN-XL) 10 MG 24 hr tablet Take 1 tablet (10 mg total) by mouth 3 (three) times daily. 07/13/22 07/10/23  Alfredo Martinez, MD  oxyCODONE (OXY IR/ROXICODONE) 5 MG immediate release tablet Take 5 mg by mouth every 4 (four) hours as needed for moderate pain or severe pain. 03/13/21   [provider]  Probiotic Product (PROBIOTIC-10 PO) Take by mouth.    [provider]  Pumpkin Seed 500 MG CAPS Take 1,000 mg by mouth daily.    [provider]  Zinc Oxide-Vitamin C (ZINC PLUS VITAMIN C PO) Take by mouth daily.    [provider]    Physical Exam: Vitals:   11/30/22 1908 11/30/22 1910 11/30/22 2215 11/30/22 2259  BP: 124/83  111/76   Pulse: (!) 107  (!) 102   Resp: 20  16   Temp: 99 F (37.2 C)   98.2 F (36.8 C)  TempSrc: Oral   Oral  SpO2: 96%  97%   Weight:  77.1 kg    Height:  6' (1.829 m)     Physical Exam Vitals and nursing note reviewed.  Constitutional:      General: He is not in acute distress.    Appearance: He is normal weight.  HENT:     Head: Normocephalic and atraumatic.     Mouth/Throat:     Mouth: Mucous membranes are moist.     Comments: Dry Cardiovascular:     Rate and Rhythm: Regular rhythm. Tachycardia present.     Heart sounds: No murmur heard. Pulmonary:     Effort: Tachypnea present. No accessory muscle usage.     Breath sounds: Examination of the right-middle field reveals decreased breath sounds. Examination of the right-lower field reveals decreased breath sounds. Decreased breath sounds present. No wheezing, rhonchi or rales.  Abdominal:     General: Bowel sounds are normal. There is no distension.     Palpations: Abdomen is soft.     Tenderness: There is no abdominal tenderness.  Musculoskeletal:     Right lower leg: No edema.   Neurological:     Mental Status: He is alert.     Comments: Left upper extremity contractures, diffuse weakness and muscle mass loss  Psychiatric:        Mood and Affect: Mood normal.        Behavior: Behavior normal.    Data Reviewed: CBC with WBC of 16.8, hemoglobin of 12.0, platelets of 524 BMP with sodium of 128, potassium 4.0, bicarb 25, glucose 110, BUN 11, creatinine 0.46 with GFR above 60 Lactic acid 1.1 Troponin 13  COVID-19, influenza and RSV PCR negative  EKG personally reviewed.  Sinus rhythm with rate of 102.  No acute ST or T wave changes concerning for acute ischemia  DG Chest Port 1 View  Result Date: 11/30/2022 CLINICAL DATA:  Shortness of breath EXAM: PORTABLE CHEST 1 VIEW COMPARISON:  04/14/2021, 04/17/2021 FINDINGS: Large ovoid opacity in the right thorax. Left lung grossly clear. Normal cardiac size. No pneumothorax. Multiple loops of air distended bowel in the upper abdomen IMPRESSION: Large ovoid opacity in the right thorax, indeterminate for large pulmonary mass or other airspace disease. Recommend contrast-enhanced chest CT for further assessment. Electronically Signed   By: Jasmine Pang M.D.   On: 11/30/2022 21:34    Results are pending, will review when available.  Assessment and Plan:  * Acute hypoxic respiratory failure (HCC) Patient is presenting with acute hypoxic respiratory failure in the setting of 1 month of cough that has not improved after multiple rounds of antibiotics.  Chest x-ray with large ovoid mass in the right thorax.  Please see below for further details regarding workup.  Suspect hypoxia secondary to large portion of lung now being occupied.  - Continue supplemental oxygen to maintain oxygen saturation above 88% - Wean as tolerated - Patient will likely require discharge with supplemental oxygen  Lung mass Patient is presenting with 1 month of cough with previous right lower lobe opacity noted now with enlarging oval opacity involving  the right lower and middle lung fields.  Given he has not responded to antibiotics, differential includes a postobstructive pneumonia potentially in the setting of abscess (high aspiration risk in the setting of MS) versus bronchogenic carcinoma. Month-long fever and decreased appetite could be caused by either. Personal review of CT demonstrates a large 10 x 10 x 20 cm mass with thick walls. No central cavitation or gas.   Although patient met SIRS criteria with respiratory rate of 20 and heart rate of 109, there is no evidence of life-threatening organ dysfunction to suggest sepsis (per 2016 SEPSIS-3 guidelines).   - Telemetry monitoring - CT chest with contrast completed - radiology reading pending - Consider IR biopsy vs drainage give mass abuts the chest wall - Continue ceftriaxone and azithromycin - Sputum culture if able to be obtained - Strep pneumo and Legionella urinary antigens  Hyponatremia Mild hyponatremia of 128 noted. PO intake has diminished over the past 1 week, so likely dehydration related.   - S/p 1 L bolus - Continue maintenance fluids overnight - Recheck sodium in the AM  Leukocytosis Likely reactive due to lung mass, which may be an abscess. Patient remains afebrile at this time.   - Repeat CBC in the AM - Blood cultures pending  Constipation Mrs. Sadiq states patient is on multiple daily laxatives and motility agents with only partial results. He usually has a bowel movement every 4-5 days. His last BM was 5 days ago. No N/V reported at this time, nor distention on examination to suggest SBO.   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  - Continue home oxycodone, baclofen and gabapentin  Stage IV decubitus ulcer (HCC) Per patient's wife, patient has chronic ulcerations and HH visits to help  manage.   - Wound care consult placed  Advance Care Planning:   Code Status: Full Code patient states that at this point, he would want a short-term trial of resuscitative efforts in the event of cardiac or pulmonary arrest.  His wife at bedside states that they have signed a advanced directive and notes that he would not want to be  on long-term life support.  We discussed that if patient is not responding to life-sustaining measures, they can always transition to comfort measures when they feel ready.  Consults: None  Family Communication: Patient's wife updated at bedside  Severity of Illness: The appropriate patient status for this patient is OBSERVATION. Observation status is judged to be reasonable and necessary in order to provide the required intensity of service to ensure the patient's safety. The patient's presenting symptoms, physical exam findings, and initial radiographic and laboratory data in the context of their medical condition is felt to place them at decreased risk for further clinical deterioration. Furthermore, it is anticipated that the patient will be medically stable for discharge from the hospital within 2 midnights of admission.   Author: Verdene Lennert, MD 11/30/2022 11:24 PM  For on call review [email or web address].

## 2022-11-30 NOTE — Assessment & Plan Note (Addendum)
Per patient's wife, patient has chronic ulcerations and HH visits to help manage.   - Wound care consult placed

## 2022-11-30 NOTE — Assessment & Plan Note (Signed)
Mrs. Elderkin states patient is on multiple daily laxatives and motility agents with only partial results. He usually has a bowel movement every 4-5 days. His last BM was 5 days ago. No N/V reported at this time, nor distention on examination to suggest SBO.   - Resume home regimen

## 2022-11-30 NOTE — Assessment & Plan Note (Signed)
Likely reactive due to lung mass, which may be an abscess. Patient remains afebrile at this time.   - Repeat CBC in the AM - Blood cultures pending

## 2022-11-30 NOTE — ED Triage Notes (Signed)
Pt presents via EMS from home c/o SOB. Pt wife reports pt has completed 3 rounds of abx for pneumonia and still having SOB. Also reports there is some concern for aspiration. Wife reports pt completed last round of abx x1 week ago. Also reports pt 02 85-87% when initially dx with pneumonia. Hx of MS.

## 2022-11-30 NOTE — Assessment & Plan Note (Signed)
History of primary progressive MS, currently bedbound.  - Continue home oxycodone, baclofen and gabapentin

## 2022-11-30 NOTE — Assessment & Plan Note (Signed)
Mild hyponatremia of 128 noted. PO intake has diminished over the past 1 week, so likely dehydration related.   - S/p 1 L bolus - Continue maintenance fluids overnight - Recheck sodium in the AM

## 2022-11-30 NOTE — Assessment & Plan Note (Addendum)
Patient is presenting with 1 month of cough with previous right lower lobe opacity noted now with enlarging oval opacity involving the right lower and middle lung fields.  Given he has not responded to antibiotics, differential includes a postobstructive pneumonia potentially in the setting of abscess (high aspiration risk in the setting of MS) versus bronchogenic carcinoma. Month-long fever and decreased appetite could be caused by either. Personal review of CT demonstrates a large 10 x 10 x 20 cm mass with thick walls. No central cavitation or gas.   Although patient met SIRS criteria with respiratory rate of 20 and heart rate of 109, there is no evidence of life-threatening organ dysfunction to suggest sepsis (per 2016 SEPSIS-3 guidelines).   - Telemetry monitoring - CT chest with contrast completed - radiology reading pending - Consider IR biopsy vs drainage give mass abuts the chest wall - Continue ceftriaxone and azithromycin - Sputum culture if able to be obtained - Strep pneumo and Legionella urinary antigens

## 2022-11-30 NOTE — Assessment & Plan Note (Addendum)
Patient is presenting with acute hypoxic respiratory failure in the setting of 1 month of cough that has not improved after multiple rounds of antibiotics.  Chest x-ray with large ovoid mass in the right thorax.  Please see below for further details regarding workup.  Suspect hypoxia secondary to large portion of lung now being occupied.  - Continue supplemental oxygen to maintain oxygen saturation above 88% - Wean as tolerated - Patient will likely require discharge with supplemental oxygen

## 2022-12-01 ENCOUNTER — Encounter: Payer: Self-pay | Admitting: Internal Medicine

## 2022-12-01 DIAGNOSIS — D631 Anemia in chronic kidney disease: Secondary | ICD-10-CM | POA: Diagnosis present

## 2022-12-01 DIAGNOSIS — H6123 Impacted cerumen, bilateral: Secondary | ICD-10-CM | POA: Diagnosis present

## 2022-12-01 DIAGNOSIS — A419 Sepsis, unspecified organism: Secondary | ICD-10-CM | POA: Diagnosis not present

## 2022-12-01 DIAGNOSIS — J851 Abscess of lung with pneumonia: Secondary | ICD-10-CM | POA: Diagnosis present

## 2022-12-01 DIAGNOSIS — R1310 Dysphagia, unspecified: Secondary | ICD-10-CM | POA: Diagnosis present

## 2022-12-01 DIAGNOSIS — Z7401 Bed confinement status: Secondary | ICD-10-CM | POA: Diagnosis not present

## 2022-12-01 DIAGNOSIS — J90 Pleural effusion, not elsewhere classified: Secondary | ICD-10-CM | POA: Diagnosis present

## 2022-12-01 DIAGNOSIS — G894 Chronic pain syndrome: Secondary | ICD-10-CM | POA: Diagnosis present

## 2022-12-01 DIAGNOSIS — G35 Multiple sclerosis: Secondary | ICD-10-CM | POA: Diagnosis present

## 2022-12-01 DIAGNOSIS — J869 Pyothorax without fistula: Secondary | ICD-10-CM | POA: Diagnosis not present

## 2022-12-01 DIAGNOSIS — N319 Neuromuscular dysfunction of bladder, unspecified: Secondary | ICD-10-CM | POA: Diagnosis present

## 2022-12-01 DIAGNOSIS — Z515 Encounter for palliative care: Secondary | ICD-10-CM | POA: Diagnosis not present

## 2022-12-01 DIAGNOSIS — R918 Other nonspecific abnormal finding of lung field: Secondary | ICD-10-CM | POA: Diagnosis present

## 2022-12-01 DIAGNOSIS — Z881 Allergy status to other antibiotic agents status: Secondary | ICD-10-CM | POA: Diagnosis not present

## 2022-12-01 DIAGNOSIS — Z9359 Other cystostomy status: Secondary | ICD-10-CM | POA: Diagnosis not present

## 2022-12-01 DIAGNOSIS — N3289 Other specified disorders of bladder: Secondary | ICD-10-CM | POA: Diagnosis present

## 2022-12-01 DIAGNOSIS — Z79899 Other long term (current) drug therapy: Secondary | ICD-10-CM | POA: Diagnosis not present

## 2022-12-01 DIAGNOSIS — K5900 Constipation, unspecified: Secondary | ICD-10-CM | POA: Diagnosis present

## 2022-12-01 DIAGNOSIS — Z66 Do not resuscitate: Secondary | ICD-10-CM | POA: Diagnosis present

## 2022-12-01 DIAGNOSIS — D72828 Other elevated white blood cell count: Secondary | ICD-10-CM | POA: Diagnosis present

## 2022-12-01 DIAGNOSIS — E860 Dehydration: Secondary | ICD-10-CM | POA: Diagnosis present

## 2022-12-01 DIAGNOSIS — R059 Cough, unspecified: Secondary | ICD-10-CM | POA: Diagnosis not present

## 2022-12-01 DIAGNOSIS — L89154 Pressure ulcer of sacral region, stage 4: Secondary | ICD-10-CM | POA: Diagnosis present

## 2022-12-01 DIAGNOSIS — J9601 Acute respiratory failure with hypoxia: Secondary | ICD-10-CM | POA: Diagnosis present

## 2022-12-01 DIAGNOSIS — E871 Hypo-osmolality and hyponatremia: Secondary | ICD-10-CM | POA: Diagnosis present

## 2022-12-01 DIAGNOSIS — N312 Flaccid neuropathic bladder, not elsewhere classified: Secondary | ICD-10-CM | POA: Diagnosis not present

## 2022-12-01 DIAGNOSIS — J189 Pneumonia, unspecified organism: Secondary | ICD-10-CM | POA: Diagnosis not present

## 2022-12-01 DIAGNOSIS — Z1152 Encounter for screening for COVID-19: Secondary | ICD-10-CM | POA: Diagnosis not present

## 2022-12-01 LAB — BASIC METABOLIC PANEL
Anion gap: 6 (ref 5–15)
BUN: 11 mg/dL (ref 8–23)
CO2: 24 mmol/L (ref 22–32)
Calcium: 7.8 mg/dL — ABNORMAL LOW (ref 8.9–10.3)
Chloride: 99 mmol/L (ref 98–111)
Creatinine, Ser: 0.35 mg/dL — ABNORMAL LOW (ref 0.61–1.24)
GFR, Estimated: >60 mL/min (ref >60)
Glucose, Bld: 100 mg/dL — ABNORMAL HIGH (ref 70–99)
Potassium: 3.6 mmol/L (ref 3.5–5.1)
Sodium: 129 mmol/L — ABNORMAL LOW (ref 135–145)

## 2022-12-01 LAB — CBC WITH DIFFERENTIAL/PLATELET
Abs Immature Granulocytes: 0.11 10*3/uL — ABNORMAL HIGH (ref 0.00–0.07)
Basophils Absolute: 0.1 10*3/uL (ref 0.0–0.1)
Basophils Relative: 1 %
Eosinophils Absolute: 0.1 10*3/uL (ref 0.0–0.5)
Eosinophils Relative: 1 %
HCT: 30.5 % — ABNORMAL LOW (ref 39.0–52.0)
Hemoglobin: 9.9 g/dL — ABNORMAL LOW (ref 13.0–17.0)
Immature Granulocytes: 1 %
Lymphocytes Relative: 9 %
Lymphs Abs: 1.1 10*3/uL (ref 0.7–4.0)
MCH: 29.6 pg (ref 26.0–34.0)
MCHC: 32.5 g/dL (ref 30.0–36.0)
MCV: 91.3 fL (ref 80.0–100.0)
Monocytes Absolute: 1 10*3/uL (ref 0.1–1.0)
Monocytes Relative: 8 %
Neutro Abs: 9.8 10*3/uL — ABNORMAL HIGH (ref 1.7–7.7)
Neutrophils Relative %: 80 %
Platelets: 436 10*3/uL — ABNORMAL HIGH (ref 150–400)
RBC: 3.34 MIL/uL — ABNORMAL LOW (ref 4.22–5.81)
RDW: 14.5 % (ref 11.5–15.5)
WBC: 12.2 10*3/uL — ABNORMAL HIGH (ref 4.0–10.5)
nRBC: 0 % (ref 0.0–0.2)

## 2022-12-01 LAB — STREP PNEUMONIAE URINARY ANTIGEN: Strep Pneumo Urinary Antigen: NEGATIVE

## 2022-12-01 LAB — POTASSIUM: Potassium: 3.7 mmol/L (ref 3.5–5.1)

## 2022-12-01 MED ORDER — OXYBUTYNIN CHLORIDE 5 MG PO TABS
5.0000 mg | ORAL_TABLET | Freq: Three times a day (TID) | ORAL | Status: DC
  Administered 2022-12-01 – 2022-12-14 (×39): 5 mg via ORAL
  Filled 2022-12-01 (×45): qty 1

## 2022-12-01 MED ORDER — SODIUM CHLORIDE 0.9 % IV SOLN
3.0000 g | Freq: Four times a day (QID) | INTRAVENOUS | Status: DC
  Administered 2022-12-01 – 2022-12-12 (×46): 3 g via INTRAVENOUS
  Filled 2022-12-01 (×48): qty 8

## 2022-12-01 MED ORDER — OXYBUTYNIN CHLORIDE 5 MG PO TABS
5.0000 mg | ORAL_TABLET | Freq: Three times a day (TID) | ORAL | Status: DC
  Filled 2022-12-01: qty 1

## 2022-12-01 MED ORDER — POTASSIUM CHLORIDE 10 MEQ/100ML IV SOLN: 10.0000 meq | INTRAVENOUS | Status: DC

## 2022-12-01 NOTE — Consult Note (Signed)
WOC Consult requested for pressure injury assessment to bilat buttocks/sacrum.  Secure chat message sent to primary team as follows, "There are no WOC team nurses working at [facility] today; we are currently at other campuses. Can you please take photos of this patient's wounds and enter them into the EMR and we will review and provide topical treatment orders for the bedside nurses to perform. Your assistance is greatly appreciated. Thank-you." Thank-you,  Cammie Mcgee MSN, RN, CWOCN, [place], CNS [phone]

## 2022-12-01 NOTE — Plan of Care (Signed)
Informed Dr. Rennis Petty critical 2.4 potassium

## 2022-12-01 NOTE — ED Notes (Signed)
Unable to complete full skin assessment due to pain with repositioning

## 2022-12-01 NOTE — Progress Notes (Signed)
Pharmacy Antibiotic Note  Ronald Santiago is a 77 y.o. male admitted on 11/30/2022 with  lung abscess .  Pharmacy has been consulted for Unasyn dosing.  Plan: Unasyn 3 gm IV Q6H ordered to start on 9/10 @ ~ 0000.   Height: 6' (182.9 cm) Weight: 77.1 kg (170 lb) IBW/kg (Calculated) : 77.6  Temp (24hrs), Avg:98.4 F (36.9 C), Min:98.1 F (36.7 C), Max:99 F (37.2 C)  Recent Labs  Lab 11/30/22 2003  WBC 16.8*  CREATININE 0.46*  LATICACIDVEN 1.1    Estimated Creatinine Clearance: 87 mL/min (A) (by C-G formula based on SCr of 0.46 mg/dL (L)).    Allergies  Allergen Reactions   Cephalexin Anaphylaxis and Hives    Pt tolerated several doses of cefepime during hospitalization in 03/2021    Antimicrobials this admission:   >>    >>   Dose adjustments this admission:   Microbiology results:  BCx:   UCx:    Sputum:    MRSA PCR:   Thank you for allowing pharmacy to be a part of this patient's care.  Wilhelmina Hark D 12/01/2022 12:27 AM

## 2022-12-01 NOTE — Hospital Course (Addendum)
Ronald Santiago is a 77 y.o. 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 Few courses of abx since 08/2022 for presumed PNA/bronchitis.CXR 11/06/22 (+)RLL infiltrate. 11/27/22 SpO2 87% w/ home PT but pt refused to go to ED>  09/09: to ED. SpO2 80s improved on 3L [place] to 96%. WBC 16.8, sodium 128, Chest x-ray was obtained that demonstrated a large ovoid opacity in the right thorax. CT chest/abd/pelv (+) concern for pulmonary empyema, pneumonia, reactive hilar lymphadenopathy, colonic ileus/enteritis. Admitted to hospitalist service.  09/10: IR plan chest tube placement tomorrow, consult to pulmonary (Dr Belia Heman alerted and will see pt).    Consultants:  Interventional radiology Pulmonology   Procedures: none    ASSESSMENT & PLAN:   Principal Problem:   Acute hypoxic respiratory failure (HCC) Active Problems:   Lung mass   Hyponatremia   Leukocytosis   Stage IV decubitus ulcer (HCC)   Multiple sclerosis, primary progressive (HCC)   Flaccid neuropathic bladder, not elsewhere classified   Constipation   Acute hypoxic respiratory failure Likely d/t large lung mass/empyema, pneumonia  Continue supplemental oxygen to maintain oxygen saturation above 88%. Wean as tolerated but will likely require discharge with supplemental oxygen Treat underlying causes as below    Lung mass w/ concern for empyema Ddx: postobstructive pneumonia potentially in the setting of abscess (high aspiration risk in the setting of MS) versus bronchogenic carcinoma. Month-long fever and decreased appetite could be caused by either.  Note: Although patient met SIRS criteria with respiratory rate of 20 and heart  rate of 109, there is no evidence of life-threatening organ dysfunction to suggest sepsis (per 2016 SEPSIS-3 guidelines).  Telemetry monitoring IR consult - eval for thoracentesis vs chest tube vs other management --> plan chest tube placement tomorrow Pulmonary consult - chest tube management, also if concern for obstructive process may need bronch Continue ceftriaxone and azithromycin Sputum culture  Strep pneumo and Legionella urinary antigens   Hyponatremia Mild hyponatremia of 128 noted. 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 Patient remains afebrile at this time.  WBC downtrending Abx as above  Blood cultures pending Monitor CBC   Constipation, concern for ileus  On multiple daily laxatives and motility agents with only partial results. He usually has a bowel movement every 4-5 days. His last BM was 5 days ago. No N/V reported at this time, nor distention on examination to suggest SBO.  Continue home regimen Adding Enema prn No concern for obstruction at this poitn but low threshold for surgical consult / NG placement if worsening    Flaccid neuropathic bladder, not elsewhere classified Continue suprapubic catheter   Multiple sclerosis, primary progressive (HCC) History of primary progressive MS, currently bedbound. Continue home oxycodone, baclofen and gabapentin Mobilize in bed / OOB to chair as able    Stage IV decubitus ulcer (HCC) Per patient's wife, patient has chronic ulcerations and HH visits to help manage.   routine care,  will attempt to get photo but pt reports severe pain w/ repositioning     Concern for malnutrition/underweight based on  BMI: Body mass index is 23.06 kg/m.   DVT prophylaxis: lovenox  IV fluids: LR continuous IV fluids  Nutrition: regular diet Central lines / invasive devices: chronic suprapubic catheter   Code Status: FULL CODE, per H&P 11/30/22 Dr Huel Cote: "patient states that at this point, he would want a short-term trial of resuscitative efforts in the event of cardiac or pulmonary arrest. His wife at bedside states that they have signed a advanced directive and notes that he would not want to be on long-term life support. We discussed that if patient is not responding to life-sustaining measures, they can always transition to comfort measures when they feel ready."  ACP documentation reviewed: none on file in [facility]  TOC needs: none at this time Barriers to dispo / significant pending items: clinical improvement, address lung mass, await cultures, expect will be here few days

## 2022-12-01 NOTE — Progress Notes (Signed)
PROGRESS NOTE    Ronald Santiago   WGN:[id number] DOB: 08/03/1945  DOA: 11/30/2022 Date of Service: 12/01/22 PCP: Mick Sell, MD     Brief Narrative / Hospital Course:  Ronald Santiago is a 77 y.o. 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 Few courses of abx since 08/2022 for presumed PNA/bronchitis.CXR 11/06/22 (+)RLL infiltrate. 11/27/22 SpO2 87% w/ home PT but pt refused to go to ED>  09/09: to ED. SpO2 80s improved on 3L [place] to 96%. WBC 16.8, sodium 128, Chest x-ray was obtained that demonstrated a large ovoid opacity in the right thorax. CT chest/abd/pelv (+) concern for pulmonary empyema, pneumonia, reactive hilar lymphadenopathy, colonic ileus/enteritis. Admitted to hospitalist service.  09/10: IR plan chest tube placement tomorrow, consult to pulmonary (Dr Belia Heman alerted and will see pt).    Consultants:  Interventional radiology Pulmonology   Procedures: none    ASSESSMENT & PLAN:   Principal Problem:   Acute hypoxic respiratory failure (HCC) Active Problems:   Lung mass   Hyponatremia   Leukocytosis   Stage IV decubitus ulcer (HCC)   Multiple sclerosis, primary progressive (HCC)   Flaccid neuropathic bladder, not elsewhere classified   Constipation   Acute hypoxic respiratory failure Likely d/t large lung mass/empyema, pneumonia  Continue supplemental oxygen to maintain oxygen saturation above 88%. Wean as tolerated but will likely require discharge with supplemental oxygen Treat underlying causes as below    Lung mass w/ concern for empyema Ddx: postobstructive pneumonia potentially in the setting of abscess (high aspiration risk in the setting  of MS) versus bronchogenic carcinoma. Month-long fever and decreased appetite could be caused by either.  Note: Although patient met SIRS criteria with respiratory rate of 20 and heart rate of 109, there is no evidence of life-threatening organ dysfunction to suggest sepsis (per 2016 SEPSIS-3 guidelines).  Telemetry monitoring IR consult - eval for thoracentesis vs chest tube vs other management --> plan chest tube placement tomorrow Pulmonary consult - chest tube management, also if concern for obstructive process may need bronch Continue ceftriaxone and azithromycin Sputum culture  Strep pneumo and Legionella urinary antigens   Hyponatremia Mild hyponatremia of 128 noted. 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 Patient remains afebrile at this time.  WBC downtrending Abx as above  Blood cultures pending Monitor CBC   Constipation, concern for ileus  On multiple daily laxatives and motility agents with only partial results. He usually has a bowel movement every 4-5 days. His last BM was 5 days ago. No N/V reported at this time, nor distention on examination to suggest SBO.  Continue home regimen Adding Enema prn No concern for obstruction at this poitn but low threshold for surgical consult / NG placement if worsening    Flaccid neuropathic bladder, not elsewhere classified Continue suprapubic catheter   Multiple sclerosis, primary progressive (HCC) History of primary progressive MS, currently bedbound. Continue home oxycodone, baclofen and gabapentin  Mobilize in bed / OOB to chair as able    Stage IV decubitus ulcer (HCC) Per patient's wife, patient has chronic  ulcerations and HH visits to help manage.   routine care, will attempt to get photo but pt reports severe pain w/ repositioning     Concern for malnutrition/underweight based on BMI: Body mass index is 23.06 kg/m.   DVT prophylaxis: lovenox  IV fluids: LR continuous IV fluids  Nutrition: regular diet Central lines / invasive devices: chronic suprapubic catheter   Code Status: FULL CODE, per H&P 11/30/22 Dr Huel Cote: "patient states that at this point, he would want a short-term trial of resuscitative efforts in the event of cardiac or pulmonary arrest. His wife at bedside states that they have signed a advanced directive and notes that he would not want to be on long-term life support. We discussed that if patient is not responding to life-sustaining measures, they can always transition to comfort measures when they feel ready."  ACP documentation reviewed: none on file in [facility]  TOC needs: none at this time Barriers to dispo / significant pending items: clinical improvement, address lung mass, await cultures, expect will be here few days              Subjective / Brief ROS:  Patient reports breathing is OK at rest/supine.  Denies CP/SOB.  Pain controlled.  Denies new weakness.   Family Communication: wife is at bedside on rounds     Objective Findings:  Vitals:   12/01/22 0535 12/01/22 0849 12/01/22 1100 12/01/22 1252  BP: 126/79 127/68 122/72 121/73  Pulse: 93 97 86 96  Resp: 15 16 18 19   Temp: 98.5 F (36.9 C)  98.5 F (36.9 C) 98.7 F (37.1 C)  TempSrc: Oral  Oral   SpO2: 98% 98% 100% 97%  Weight:      Height:        Intake/Output Summary (Last 24 hours) at 12/01/2022 1350 Last data filed at 12/01/2022 0534 Gross per 24 hour  Intake 621.89 ml  Output 750 ml  Net -128.11 ml   Filed Weights   11/30/22 1910  Weight: 77.1 kg    Examination:  Physical Exam Constitutional:      General: He is not in acute distress. Cardiovascular:     Rate and  Rhythm: Normal rate and regular rhythm.  Pulmonary:     Breath sounds: Examination of the right-middle field reveals decreased breath sounds. Examination of the right-lower field reveals decreased breath sounds. Decreased breath sounds present.  Abdominal:     Palpations: Abdomen is soft.     Tenderness: There is no guarding or rebound.  Musculoskeletal:     Right lower leg: No edema.     Left lower leg: No edema.  Skin:    General: Skin is warm and dry.  Neurological:     Mental Status: He is alert and oriented to person, place, and time.  Psychiatric:        Mood and Affect: Mood normal.        Behavior: Behavior normal.          Scheduled Medications:   baclofen  20 mg Oral BID WC   baclofen  40 mg Oral QHS   docusate sodium  400 mg Oral QHS   enoxaparin (LOVENOX) injection  40 mg Subcutaneous Q24H   gabapentin  1,200 mg Oral TID   loratadine  10 mg Oral Daily   magnesium oxide  400 mg Oral Daily  naloxegol oxalate  25 mg Oral Daily   oxybutynin  5 mg Oral TID   oxyCODONE  10 mg Oral Q8H   sodium chloride flush  3 mL Intravenous Q12H    Continuous Infusions:  ampicillin-sulbactam (UNASYN) IV Stopped (12/01/22 1610)   azithromycin Stopped (11/30/22 2128)    PRN Medications:  acetaminophen **OR** acetaminophen, chlorpheniramine-HYDROcodone, clonazePAM, ondansetron **OR** ondansetron (ZOFRAN) IV, polyethylene glycol  Antimicrobials from admission:  Anti-infectives (From admission, onward)    Start     Dose/Rate Route Frequency Ordered Stop   12/01/22 0015  Ampicillin-Sulbactam (UNASYN) 3 g in sodium chloride 0.9 % 100 mL IVPB        3 g 200 mL/hr over 30 Minutes Intravenous Every 6 hours 12/01/22 0010     11/30/22 2000  cefTRIAXone (ROCEPHIN) 1 g in sodium chloride 0.9 % 100 mL IVPB  Status:  Discontinued        1 g 200 mL/hr over 30 Minutes Intravenous Every 24 hours 11/30/22 1948 11/30/22 2335   11/30/22 2000  azithromycin (ZITHROMAX) 500 mg in sodium  chloride 0.9 % 250 mL IVPB        500 mg 250 mL/hr over 60 Minutes Intravenous Every 24 hours 11/30/22 1948 12/05/22 1959   11/30/22 1945  levofloxacin (LEVAQUIN) IVPB 750 mg  Status:  Discontinued        750 mg 100 mL/hr over 90 Minutes Intravenous  Once 11/30/22 1935 11/30/22 1948           Data Reviewed:  I have personally reviewed the following...  CBC: Recent Labs  Lab 11/30/22 2003 12/01/22 0530  WBC 16.8* 12.2*  NEUTROABS  --  9.8*  HGB 12.0* 9.9*  HCT 37.0* 30.5*  MCV 89.8 91.3  PLT 524* 436*   Basic Metabolic Panel: Recent Labs  Lab 11/30/22 2003 12/01/22 0530 12/01/22 1311  NA 128* 129*  --   K 4.0 3.6 3.7  CL 92* 99  --   CO2 25 24  --   GLUCOSE 110* 100*  --   BUN 11 11  --   CREATININE 0.46* 0.35*  --   CALCIUM 8.5* 7.8*  --    GFR: Estimated Creatinine Clearance: 87 mL/min (A) (by C-G formula based on SCr of 0.35 mg/dL (L)). Liver Function Tests: No results for input(s): "AST", "ALT", "ALKPHOS", "BILITOT", "PROT", "ALBUMIN" in the last 168 hours. No results for input(s): "LIPASE", "AMYLASE" in the last 168 hours. No results for input(s): "AMMONIA" in the last 168 hours. Coagulation Profile: No results for input(s): "INR", "PROTIME" in the last 168 hours. Cardiac Enzymes: No results for input(s): "CKTOTAL", "CKMB", "CKMBINDEX", "TROPONINI" in the last 168 hours. BNP (last 3 results) No results for input(s): "PROBNP" in the last 8760 hours. HbA1C: No results for input(s): "HGBA1C" in the last 72 hours. CBG: No results for input(s): "GLUCAP" in the last 168 hours. Lipid Profile: No results for input(s): "CHOL", "HDL", "LDLCALC", "TRIG", "CHOLHDL", "LDLDIRECT" in the last 72 hours. Thyroid Function Tests: No results for input(s): "TSH", "T4TOTAL", "FREET4", "T3FREE", "THYROIDAB" in the last 72 hours. Anemia Panel: No results for input(s): "VITAMINB12", "FOLATE", "FERRITIN", "TIBC", "IRON", "RETICCTPCT" in the last 72 hours. Most Recent  Urinalysis On File:     Component Value Date/Time   COLORURINE YELLOW (A) 11/30/2022 2002   APPEARANCEUR CLEAR (A) 11/30/2022 2002   LABSPEC 1.045 (H) 11/30/2022 2002   PHURINE 5.0 11/30/2022 2002   GLUCOSEU NEGATIVE 11/30/2022 2002   HGBUR NEGATIVE 11/30/2022 2002   BILIRUBINUR  NEGATIVE 11/30/2022 2002   KETONESUR 5 (A) 11/30/2022 2002   PROTEINUR NEGATIVE 11/30/2022 2002   NITRITE NEGATIVE 11/30/2022 2002   LEUKOCYTESUR TRACE (A) 11/30/2022 2002   Sepsis Labs: @LABRCNTIP (procalcitonin:4,lacticidven:4) Microbiology: Recent Results (from the past 240 hour(s))  Resp panel by RT-PCR (RSV, Flu A&B, Covid) Anterior Nasal Swab     Status: None   Collection Time: 11/30/22  8:03 PM   Specimen: Anterior Nasal Swab  Result Value Ref Range Status   SARS Coronavirus 2 by RT PCR NEGATIVE NEGATIVE Final    Comment: (NOTE) SARS-CoV-2 target nucleic acids are NOT DETECTED.  The SARS-CoV-2 RNA is generally detectable in upper respiratory specimens during the acute phase of infection. The lowest concentration of SARS-CoV-2 viral copies this assay can detect is 138 copies/mL. A negative result does not preclude SARS-Cov-2 infection and should not be used as the sole basis for treatment or other patient management decisions. A negative result may occur with  improper specimen collection/handling, submission of specimen other than nasopharyngeal swab, presence of viral mutation(s) within the areas targeted by this assay, and inadequate number of viral copies(<138 copies/mL). A negative result must be combined with clinical observations, patient history, and epidemiological information. The expected result is Negative.  Fact Sheet for Patients:  BloggerCourse.com  Fact Sheet for Healthcare Providers:  SeriousBroker.it  This test is no t yet approved or cleared by the [place] FDA and  has been authorized for detection and/or diagnosis of  SARS-CoV-2 by FDA under an Emergency Use Authorization (EUA). This EUA will remain  in effect (meaning this test can be used) for the duration of the COVID-19 declaration under Section 564(b)(1) of the Act, 21 U.S.C.section 360bbb-3(b)(1), unless the authorization is terminated  or revoked sooner.       Influenza A by PCR NEGATIVE NEGATIVE Final   Influenza B by PCR NEGATIVE NEGATIVE Final    Comment: (NOTE) The Xpert Xpress SARS-CoV-2/FLU/RSV plus assay is intended as an aid in the diagnosis of influenza from Nasopharyngeal swab specimens and should not be used as a sole basis for treatment. Nasal washings and aspirates are unacceptable for Xpert Xpress SARS-CoV-2/FLU/RSV testing.  Fact Sheet for Patients: BloggerCourse.com  Fact Sheet for Healthcare Providers: SeriousBroker.it  This test is not yet approved or cleared by the [place] FDA and has been authorized for detection and/or diagnosis of SARS-CoV-2 by FDA under an Emergency Use Authorization (EUA). This EUA will remain in effect (meaning this test can be used) for the duration of the COVID-19 declaration under Section 564(b)(1) of the Act, 21 U.S.C. section 360bbb-3(b)(1), unless the authorization is terminated or revoked.     Resp Syncytial Virus by PCR NEGATIVE NEGATIVE Final    Comment: (NOTE) Fact Sheet for Patients: BloggerCourse.com  Fact Sheet for Healthcare Providers: SeriousBroker.it  This test is not yet approved or cleared by the [place] FDA and has been authorized for detection and/or diagnosis of SARS-CoV-2 by FDA under an Emergency Use Authorization (EUA). This EUA will remain in effect (meaning this test can be used) for the duration of the COVID-19 declaration under Section 564(b)(1) of the Act, 21 U.S.C. section 360bbb-3(b)(1), unless the authorization is terminated  or revoked.  Performed at [facility], [address] [place], [place] [address]   Blood Culture (routine x 2)     Status: None (Preliminary result)   Collection Time: 11/30/22  8:03 PM   Specimen: BLOOD  Result Value Ref Range Status   Specimen Description BLOOD BLOOD  RIGHT ARM  Final   Special Requests   Final    BOTTLES DRAWN AEROBIC AND ANAEROBIC Blood Culture results may not be optimal due to an excessive volume of blood received in culture bottles   Culture   Final    NO GROWTH < 24 HOURS Performed at [facility], [address], [place], [place] [address]    Report Status PENDING  Incomplete  Blood Culture (routine x 2)     Status: None (Preliminary result)   Collection Time: 11/30/22  8:03 PM   Specimen: BLOOD  Result Value Ref Range Status   Specimen Description BLOOD BLOOD LEFT ARM  Final   Special Requests   Final    BOTTLES DRAWN AEROBIC AND ANAEROBIC Blood Culture results may not be optimal due to an excessive volume of blood received in culture bottles   Culture  Setup Time ANAEROBIC BOTTLE ONLY  Final   Culture   Final    NO GROWTH < 24 HOURS Performed at [facility], [address], [place], [place] [address]    Report Status PENDING  Incomplete      Radiology Studies last 3 days: CT CHEST ABDOMEN PELVIS W CONTRAST  Result Date: 12/01/2022 CLINICAL DATA:  Abnormal lung x-ray EXAM: CT CHEST, ABDOMEN, AND PELVIS WITH CONTRAST TECHNIQUE: Multidetector CT imaging of the chest, abdomen and pelvis was performed following the standard protocol during bolus administration of intravenous contrast. RADIATION DOSE REDUCTION: This exam was performed according to the departmental dose-optimization program which includes automated exposure control, adjustment of the mA and/or kV according to patient size and/or use of iterative reconstruction technique. CONTRAST:  75mL OMNIPAQUE IOHEXOL 300 MG/ML  SOLN COMPARISON:  CT abdomen and pelvis  04/10/2021. FINDINGS: CT CHEST FINDINGS Cardiovascular: No significant vascular findings. Normal heart size. No pericardial effusion. Mediastinum/Nodes: Visualized thyroid gland and esophagus are within normal limits. There is an enlarged right hilar lymph node measuring 12 mm short axis. No other enlarged lymph nodes are seen. There secretions in the midtrachea. Lungs/Pleura: Well-circumscribed walled fluid collection is seen in the posterior right hemithorax, most likely within the pleural space. This measures 9.0 x 10.0 x 19.5 cm in largest AP, transverse and craniocaudad dimensions respectively. There is no air within this collection. There some septations in the superior portion of the collection. There is a adjacent compressive atelectasis of the right lower lobe. There is also atelectasis in the bilateral lung bases and right middle lobe. Patchy airspace opacities are seen in the inferior left lower lobe with some peribronchial wall thickening. Multifocal patchy ground-glass opacities are seen throughout the right upper lobe, likely infectious/inflammatory. There is no evidence for pneumothorax. Musculoskeletal: No chest wall mass or suspicious bone lesions identified. CT ABDOMEN PELVIS FINDINGS Hepatobiliary: Cystic areas in the liver appear unchanged from the prior study. The largest is in the right lobe measuring up to 4.3 cm. The gallbladder is surgically absent. There is no biliary ductal dilatation. Pancreas: Unremarkable. No pancreatic ductal dilatation or surrounding inflammatory changes. Spleen: Normal in size. Cystic areas in the spleen measuring up to 13 mm appear unchanged. Adrenals/Urinary Tract: Suprapubic Foley catheter decompresses the bladder. There is no hydronephrosis or perinephric fluid. Left renal cysts are present measuring up to 16 mm. These are similar to the prior study. The adrenal glands are within normal limits. Stomach/Bowel: There is a large amount of stool in the rectum. The  colon is diffusely distended with air without definite transition point concerning for colonic ileus. There  is no focal inflammation or wall thickening identified. The appendix is not seen. Additionally there are scattered air-fluid levels throughout small bowel loops without small bowel dilatation. The stomach is decompressed. Vascular/Lymphatic: Aortic atherosclerosis. No enlarged abdominal or pelvic lymph nodes. Reproductive: Prostate is small or absent. Other: There is no ascites. There is no focal abdominal wall hernia. Musculoskeletal: No acute or significant osseous findings. IMPRESSION: 1. Large walled-off fluid collection in the posterior right hemithorax, most likely within the pleural space. Findings are worrisome for empyema. 2. Patchy airspace opacities in the left lower lobe worrisome for pneumonia. 3. Patchy ground-glass opacities in the right upper lobe are likely infectious/inflammatory. 4. Right hilar lymphadenopathy, likely reactive. 5. Large amount of stool in the rectum with diffuse gaseous distension of the colon concerning for colonic ileus. No bowel obstruction. 6. Scattered air-fluid levels throughout small bowel loops are nonspecific, but can be seen in the setting of enteritis or ileus. 7. Suprapubic catheter in place. 8. Aortic atherosclerosis. Aortic Atherosclerosis (ICD10-I70.0). Electronically Signed   By: Darliss Cheney M.D.   On: 12/01/2022 00:03   DG Chest Port 1 View  Result Date: 11/30/2022 CLINICAL DATA:  Shortness of breath EXAM: PORTABLE CHEST 1 VIEW COMPARISON:  04/14/2021, 04/17/2021 FINDINGS: Large ovoid opacity in the right thorax. Left lung grossly clear. Normal cardiac size. No pneumothorax. Multiple loops of air distended bowel in the upper abdomen IMPRESSION: Large ovoid opacity in the right thorax, indeterminate for large pulmonary mass or other airspace disease. Recommend contrast-enhanced chest CT for further assessment. Electronically Signed   By: Jasmine Pang  M.D.   On: 11/30/2022 21:34             LOS: 0 days    Time spent: 50 min    Sunnie Nielsen, DO [facility] 12/01/2022, 1:50 PM    Dictation software may have been used to generate the above note. Typos may occur and escape review in typed/dictated notes. Please contact Dr Lyn Hollingshead directly for clarity if needed.  Staff may message me via secure chat in Epic  but this may not receive an immediate response,  please page me for urgent matters!  If 7PM-7AM, please contact night coverage [email or web address]

## 2022-12-01 NOTE — Evaluation (Signed)
Clinical/Bedside Swallow Evaluation Patient Details  Name: Ronald Santiago MRN: [id number] Date of Birth: 05-09-1945  Today's Date: 12/01/2022 Time: SLP Start Time (ACUTE ONLY): 1045 SLP Stop Time (ACUTE ONLY): 1145 SLP Time Calculation (min) (ACUTE ONLY): 60 min  Past Medical History:  Past Medical History:  Diagnosis Date   Chronic kidney disease    Multiple sclerosis, primary progressive (HCC)    Past Surgical History:  Past Surgical History:  Procedure Laterality Date   BACK SURGERY     CHOLECYSTECTOMY     EYE SURGERY     HPI:  Pt is a 77 y.o. 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 Patient had been previously evaluated/treated by his PCP for pneumonia with CXR showing right lower lobe infiltrate.  In the ED he was found hypoxic with sats below 88% and his WBC was 16.8. Imaging shows a possible empyema versus malignancy.  CT Chest/Abdomen/Pelvis 11/30/22  IMPRESSION:  1. Large walled-off fluid collection in the posterior right  hemithorax, most likely within the pleural space. Findings are  worrisome for empyema.  2. Patchy airspace opacities in the left lower lobe worrisome for  pneumonia.  3. Patchy ground-glass opacities in the right upper lobe are likely  infectious/inflammatory.  4. Right hilar lymphadenopathy, likely reactive.  5. Large amount of stool in the rectum with diffuse gaseous  distension of the colon concerning for colonic ileus. No bowel  obstruction.  6. Scattered air-fluid levels throughout small bowel loops are  nonspecific, but can be seen in the setting of enteritis or ileus.  Pt is tentatively scheduled 12/02/22 for an image-guided right chest tube placement per Vascular chart note.    Assessment / Plan /  Recommendation  Clinical Impression  mid-sternum Globus sensation at home SLP Visit Diagnosis: Dysphagia, unspecified (R13.10) (Esophageal phase Dysmotility)    Aspiration Risk  Mild aspiration risk;Risk for inadequate nutrition/hydration (reduced following general precs.)    Diet Recommendation   Thin;Dysphagia 3 (mechanical soft) (chopped/minced meats, gravies added to moisten)  Medication Administration: Whole meds with puree    Other  Recommendations Recommended Consults: Consider GI evaluation;Consider esophageal assessment (Dietician f/u) Oral Care Recommendations: Oral care BID;Oral care before and after PO;Staff/trained caregiver to provide oral care    Recommendations for follow up therapy are one component of a multi-disciplinary discharge planning process, led by the attending physician.  Recommendations may be updated based on patient status, additional functional criteria and insurance authorization.  Follow up Recommendations No SLP follow up      Assistance Recommended at Discharge    Functional Status Assessment Patient has had a recent decline in their functional status and demonstrates the ability to make significant improvements in function in a reasonable and predictable amount of time.  Frequency and Duration  (n/a)   (n/a)       Prognosis Prognosis for improved oropharyngeal function: Good Barriers to Reach Goals: Severity of deficits;Time post onset Barriers/Prognosis Comment: baseline MS; edentulous status currently      Swallow Study   General Date of Onset: 11/30/22 HPI: Pt is a 77 y.o. male with a medical history significant of advanced multiple sclerosis predominantly bedbound complicated by neurogenic bladder with suprapubic catheter, chronic pressure ulcer of the sacrum, chronic pain syndrome on oxycodone, who presented to the ED 11/30/22  due to shortness of breath, general malaise and cough. Patient had been previously evaluated/treated by his PCP for  pneumonia with CXR showing right lower lobe infiltrate.  In the ED he was found hypoxic with sats below 88% and his WBC was 16.8. Imaging shows a possible empyema versus malignancy.  CT Chest/Abdomen/Pelvis 11/30/22  IMPRESSION:  1. Large walled-off fluid collection in the posterior right  hemithorax, most likely within the pleural space. Findings are  worrisome for empyema.  2. Patchy airspace opacities in the left lower lobe worrisome for  pneumonia.  3. Patchy ground-glass opacities in the right upper lobe are likely  infectious/inflammatory.  4. Right hilar lymphadenopathy, likely reactive.  5. Large amount of stool in the rectum with diffuse gaseous  distension of the colon concerning for colonic ileus. No bowel  obstruction.  6. Scattered air-fluid levels throughout small bowel loops are  nonspecific, but can be seen in the setting of enteritis or ileus.  Pt is tentatively scheduled 12/02/22 for an image-guided right chest tube placement per Vascular chart note. Type of Study: Bedside Swallow Evaluation Previous Swallow Assessment: none Diet Prior to this Study: Regular;Thin liquids (Level 0) (at admit) Temperature Spikes Noted: No (wbc 12.2 trending down) Respiratory Status: Nasal cannula (2L) History of Recent Intubation: No Behavior/Cognition: Alert;Cooperative;Pleasant mood Oral Cavity Assessment: Within Functional Limits Oral Care Completed by SLP: Yes Oral Cavity - Dentition: Edentulous (dentures at home; ill-fit) Vision:  (n/a) Self-Feeding Abilities: Total assist (d/t weak UEs sec to MS) Patient Positioning: Upright in bed (needed full positioning support) Baseline Vocal Quality: Normal Volitional Cough: Strong;Congested Volitional Swallow: Able to elicit    Oral/Motor/Sensory Function Overall Oral Motor/Sensory Function: Within functional limits   Ice Chips Ice chips: Within functional limits Presentation: Spoon (fed; 3 trials)   Thin Liquid Thin Liquid: Within functional  limits Presentation: Straw (fed; monitored bolus size. ~4 ozs total)    Nectar Thick Nectar Thick Liquid: Not tested   Honey Thick Honey Thick Liquid: Not tested   Puree Puree: Within functional limits Presentation: Spoon (fed; 10 trials)   Solid     Solid: Impaired (edentulous; 6 trials) Presentation: Spoon (fed) Oral Phase Impairments: Impaired mastication (edentulous) Oral Phase Functional Implications: Impaired mastication Pharyngeal Phase Impairments:  (none)        Jerilynn Som, MS, CCC-SLP Speech Language Pathologist Rehab Services; [facility] [place] [phone] (ascom) Tashi Andujo 12/01/2022,3:39 PM

## 2022-12-01 NOTE — Consult Note (Signed)
Chief Complaint: Patient was seen in consultation today for  Chief Complaint  Patient presents with   Shortness of Breath   Referring Physician(s): Dr. Lyn Hollingshead  Supervising Physician: Pernell Dupre  Patient Status: [facility] - Out-pt  History of Present Illness: Ronald Santiago is a 77 y.o. 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 Patient had been previously evaluated/treated by his PCP for pneumonia with CXR showing right lower lobe infiltrate.   In the ED he was found hypoxic with sats below 88% and his WBC was 16.8. Imaging shows a possible empyema versus malignancy.   CT Chest/Abdomen/Pelvis 11/30/22 IMPRESSION: 1. Large walled-off fluid collection in the posterior right hemithorax, most likely within the pleural space. Findings are worrisome for empyema. 2. Patchy airspace opacities in the left lower lobe worrisome for pneumonia. 3. Patchy ground-glass opacities in the right upper lobe are likely infectious/inflammatory. 4. Right hilar lymphadenopathy, likely reactive. 5. Large amount of stool in the rectum with diffuse gaseous distension of the colon concerning for colonic ileus. No bowel obstruction. 6. Scattered air-fluid levels throughout small bowel loops are nonspecific, but can be seen in the setting of enteritis or ileus. 7. Suprapubic catheter in place. 8. Aortic atherosclerosis.  Interventional Radiology was consulted for right chest tube placement. Imaging reviewed and procedure approved by Dr. Juliette Alcide.   Past Medical History:  Diagnosis Date   Chronic kidney disease    Multiple sclerosis, primary progressive (HCC)     Past Surgical History:  Procedure Laterality Date   BACK SURGERY      CHOLECYSTECTOMY     EYE SURGERY      Allergies: Cephalexin  Medications: Prior to Admission medications   Medication Sig Start Date End Date Taking? Authorizing Provider  baclofen (LIORESAL) 20 MG tablet Take 20-40 mg by mouth 3 (three) times daily. (20 mg in morning, 20 mg at noon, and 40 mg at bedtime) 05/21/20  Yes [provider]  cetirizine (ZYRTEC) 10 MG tablet Take 10 mg by mouth daily.   Yes [provider]  Cholecalciferol (D3-1000 PO) Take 1,000 Units by mouth daily.   Yes [provider]  clonazePAM (KLONOPIN) 1 MG tablet Take 1 mg by mouth 2 (two) times daily as needed. 08/31/22  Yes [provider]  docusate sodium (COLACE) 100 MG capsule Take 400 mg by mouth at bedtime.   Yes [provider]  gabapentin (NEURONTIN) 600 MG tablet Take 1,200 mg by mouth 3 (three) times daily. 02/14/19  Yes [provider]  ibuprofen (ADVIL) 200 MG tablet Take 800 mg by mouth in the morning and at bedtime.   Yes [provider]  magnesium oxide (MAG-OX) 400 MG tablet Take 1 tablet by mouth daily.   Yes [provider]  MOVANTIK 25 MG TABS tablet Take 25 mg by mouth daily. 05/21/20  Yes [provider]  Multiple Vitamin (MULTIVITAMIN WITH MINERALS) TABS tablet Take 1 tablet by mouth daily.   Yes [provider]  nystatin (MYCOSTATIN/NYSTOP) powder SMARTSIG:1 Application Topical 2-3 Times Daily 06/13/20  Yes [provider]  oxybutynin (DITROPAN-XL) 10 MG 24 hr tablet Take 1 tablet (10 mg total) by mouth 3 (three) times daily. 07/13/22 07/10/23 Yes MacDiarmid, Lorin Picket, MD  oxyCODONE (OXY IR/ROXICODONE) 5 MG immediate release tablet Take  10 mg by mouth every 8 (eight) hours. 11/26/22 05/25/23 Yes [provider]  Probiotic Product (PROBIOTIC-10 PO) Take by mouth.   Yes [provider]  Pumpkin Seed 500 MG CAPS Take 1,000 mg by mouth daily.   Yes [provider]  Zinc Oxide-Vitamin C (ZINC  PLUS VITAMIN C PO) Take 1 tablet by mouth 2 (two) times daily.   Yes [provider]  oxyCODONE (OXY IR/ROXICODONE) 5 MG immediate release tablet Take 5 mg by mouth every 4 (four) hours as needed for moderate pain or severe pain. Patient not taking: Reported on 11/30/2022 03/13/21   [provider]     History reviewed. No pertinent family history.  Social History   Socioeconomic History   Marital status: Married    Spouse name: Not on file   Number of children: Not on file   Years of education: Not on file   Highest education level: Not on file  Occupational History   Not on file  Tobacco Use   Smoking status: Never    Passive exposure: Never   Smokeless tobacco: Never  Substance and Sexual Activity   Alcohol use: Never   Drug use: Never   Sexual activity: Not on file  Other Topics Concern   Not on file  Social History Narrative   Not on file   Social Determinants of Health   Financial Resource Strain: Patient Declined (10/28/2022)   Received from [facility]   Overall Financial Resource Strain (CARDIA)    Difficulty of Paying Living Expenses: Patient declined  Food Insecurity: Patient Unable To Answer (12/01/2022)   Hunger Vital Sign    Worried About Running Out of Food in the Last Year: Patient unable to answer    Ran Out of Food in the Last Year: Patient unable to answer  Transportation Needs: Patient Unable To Answer (12/01/2022)   PRAPARE - Transportation    Lack of Transportation (Medical): Patient unable to answer    Lack of Transportation (Non-Medical): Patient unable to answer  Physical Activity: Not on file  Stress: Not on file  Social Connections: Unknown (08/05/2021)   Received from [facility]   Social Network    Social Network: Not on file    Review of Systems: A 12 point ROS discussed and pertinent positives are indicated in the HPI above.  All other systems are negative.  Review of Systems   Constitutional:  Positive for appetite change and fatigue.  Respiratory:  Positive for cough and shortness of breath.   Cardiovascular:  Negative for chest pain.  Gastrointestinal:  Positive for diarrhea. Negative for nausea and vomiting.  Genitourinary:  Positive for difficulty urinating.  Musculoskeletal:  Positive for arthralgias, back pain and myalgias.  Skin:  Positive for wound.       Sacral pressure ulcer, lower extremity pressure ulcers.   Neurological:  Negative for facial asymmetry.    Vital Signs: BP 122/72 (BP Location: Right Arm)   Pulse 86   Temp 98.5 F (36.9 C) (Oral)   Resp 18   Ht 6' (1.829 m)   Wt 170 lb (77.1 kg)   SpO2 100%   BMI 23.06 kg/m   Physical Exam Constitutional:      General: He is not in acute distress. HENT:     Mouth/Throat:     Mouth: Mucous membranes are moist.     Pharynx: Oropharynx is clear.  Cardiovascular:     Rate and Rhythm: Normal rate and regular  rhythm.     Pulses: Normal pulses.     Heart sounds: Normal heart sounds.  Pulmonary:     Effort: Pulmonary effort is normal.     Comments: Decreased right breath sounds  Abdominal:     General: Bowel sounds are normal.     Palpations: Abdomen is soft.     Tenderness: There is no abdominal tenderness.  Genitourinary:    Comments: Suprapubic catheter Skin:    General: Skin is warm and dry.  Neurological:     Mental Status: He is alert and oriented to person, place, and time.  Psychiatric:        Mood and Affect: Mood normal.        Behavior: Behavior normal.        Thought Content: Thought content normal.        Judgment: Judgment normal.     Imaging: CT CHEST ABDOMEN PELVIS W CONTRAST  Result Date: 12/01/2022 CLINICAL DATA:  Abnormal lung x-ray EXAM: CT CHEST, ABDOMEN, AND PELVIS WITH CONTRAST TECHNIQUE: Multidetector CT imaging of the chest, abdomen and pelvis was performed following the standard protocol during bolus administration of intravenous contrast. RADIATION  DOSE REDUCTION: This exam was performed according to the departmental dose-optimization program which includes automated exposure control, adjustment of the mA and/or kV according to patient size and/or use of iterative reconstruction technique. CONTRAST:  75mL OMNIPAQUE IOHEXOL 300 MG/ML  SOLN COMPARISON:  CT abdomen and pelvis 04/10/2021. FINDINGS: CT CHEST FINDINGS Cardiovascular: No significant vascular findings. Normal heart size. No pericardial effusion. Mediastinum/Nodes: Visualized thyroid gland and esophagus are within normal limits. There is an enlarged right hilar lymph node measuring 12 mm short axis. No other enlarged lymph nodes are seen. There secretions in the midtrachea. Lungs/Pleura: Well-circumscribed walled fluid collection is seen in the posterior right hemithorax, most likely within the pleural space. This measures 9.0 x 10.0 x 19.5 cm in largest AP, transverse and craniocaudad dimensions respectively. There is no air within this collection. There some septations in the superior portion of the collection. There is a adjacent compressive atelectasis of the right lower lobe. There is also atelectasis in the bilateral lung bases and right middle lobe. Patchy airspace opacities are seen in the inferior left lower lobe with some peribronchial wall thickening. Multifocal patchy ground-glass opacities are seen throughout the right upper lobe, likely infectious/inflammatory. There is no evidence for pneumothorax. Musculoskeletal: No chest wall mass or suspicious bone lesions identified. CT ABDOMEN PELVIS FINDINGS Hepatobiliary: Cystic areas in the liver appear unchanged from the prior study. The largest is in the right lobe measuring up to 4.3 cm. The gallbladder is surgically absent. There is no biliary ductal dilatation. Pancreas: Unremarkable. No pancreatic ductal dilatation or surrounding inflammatory changes. Spleen: Normal in size. Cystic areas in the spleen measuring up to 13 mm appear  unchanged. Adrenals/Urinary Tract: Suprapubic Foley catheter decompresses the bladder. There is no hydronephrosis or perinephric fluid. Left renal cysts are present measuring up to 16 mm. These are similar to the prior study. The adrenal glands are within normal limits. Stomach/Bowel: There is a large amount of stool in the rectum. The colon is diffusely distended with air without definite transition point concerning for colonic ileus. There is no focal inflammation or wall thickening identified. The appendix is not seen. Additionally there are scattered air-fluid levels throughout small bowel loops without small bowel dilatation. The stomach is decompressed. Vascular/Lymphatic: Aortic atherosclerosis. No enlarged abdominal or pelvic lymph nodes. Reproductive: Prostate is small or absent. Other:  There is no ascites. There is no focal abdominal wall hernia. Musculoskeletal: No acute or significant osseous findings. IMPRESSION: 1. Large walled-off fluid collection in the posterior right hemithorax, most likely within the pleural space. Findings are worrisome for empyema. 2. Patchy airspace opacities in the left lower lobe worrisome for pneumonia. 3. Patchy ground-glass opacities in the right upper lobe are likely infectious/inflammatory. 4. Right hilar lymphadenopathy, likely reactive. 5. Large amount of stool in the rectum with diffuse gaseous distension of the colon concerning for colonic ileus. No bowel obstruction. 6. Scattered air-fluid levels throughout small bowel loops are nonspecific, but can be seen in the setting of enteritis or ileus. 7. Suprapubic catheter in place. 8. Aortic atherosclerosis. Aortic Atherosclerosis (ICD10-I70.0). Electronically Signed   By: Darliss Cheney M.D.   On: 12/01/2022 00:03   DG Chest Port 1 View  Result Date: 11/30/2022 CLINICAL DATA:  Shortness of breath EXAM: PORTABLE CHEST 1 VIEW COMPARISON:  04/14/2021, 04/17/2021 FINDINGS: Large ovoid opacity in the right thorax. Left  lung grossly clear. Normal cardiac size. No pneumothorax. Multiple loops of air distended bowel in the upper abdomen IMPRESSION: Large ovoid opacity in the right thorax, indeterminate for large pulmonary mass or other airspace disease. Recommend contrast-enhanced chest CT for further assessment. Electronically Signed   By: Jasmine Pang M.D.   On: 11/30/2022 21:34    Labs:  CBC: Recent Labs    11/30/22 2003 12/01/22 0530  WBC 16.8* 12.2*  HGB 12.0* 9.9*  HCT 37.0* 30.5*  PLT 524* 436*    COAGS: No results for input(s): "INR", "APTT" in the last 8760 hours.  BMP: Recent Labs    11/30/22 2003 12/01/22 0530  NA 128* 129*  K 4.0 3.6  CL 92* 99  CO2 25 24  GLUCOSE 110* 100*  BUN 11 11  CALCIUM 8.5* 7.8*  CREATININE 0.46* 0.35*  GFRNONAA >60 >60    LIVER FUNCTION TESTS: No results for input(s): "BILITOT", "AST", "ALT", "ALKPHOS", "PROT", "ALBUMIN" in the last 8760 hours.  TUMOR MARKERS: No results for input(s): "AFPTM", "CEA", "CA199", "CHROMGRNA" in the last 8760 hours.  Assessment and Plan:  Right lung empyema versus malignancy: Cordelro Dromgoole, 77 year old male, is tentatively scheduled 12/02/22 for an image-guided right chest tube placement. The procedure was discussed with the patient and his wife at the bedside.   Risks and benefits discussed with the patient including bleeding, infection, damage to adjacent structures, bowel perforation/fistula connection, and sepsis.  All of the patient's questions were answered, patient is agreeable to proceed. He will be NPO at midnight. CBC and PT/INR ordered for the morning. Lovenox will be held.    Consent signed and in the IR APP office at [facility].  Thank you for this interesting consult.  I greatly enjoyed meeting Cyaire Sculthorpe and look forward to participating in their care.  A copy of this report was sent to the requesting provider on this date.  Electronically Signed: Alwyn Ren, AGACNP-BC [phone] 12/01/2022, 12:39  PM   I spent a total of 20 Minutes    in face to face in clinical consultation, greater than 50% of which was counseling/coordinating care for right chest tube placement.

## 2022-12-02 ENCOUNTER — Inpatient Hospital Stay: Payer: Medicare Other

## 2022-12-02 DIAGNOSIS — J189 Pneumonia, unspecified organism: Secondary | ICD-10-CM

## 2022-12-02 DIAGNOSIS — A419 Sepsis, unspecified organism: Secondary | ICD-10-CM | POA: Diagnosis not present

## 2022-12-02 DIAGNOSIS — J869 Pyothorax without fistula: Secondary | ICD-10-CM

## 2022-12-02 DIAGNOSIS — J9601 Acute respiratory failure with hypoxia: Secondary | ICD-10-CM | POA: Diagnosis not present

## 2022-12-02 HISTORY — DX: Sepsis, unspecified organism: A41.9

## 2022-12-02 LAB — CBC
HCT: 36.4 % — ABNORMAL LOW (ref 39.0–52.0)
Hemoglobin: 11.5 g/dL — ABNORMAL LOW (ref 13.0–17.0)
MCH: 29 pg (ref 26.0–34.0)
MCHC: 31.6 g/dL (ref 30.0–36.0)
MCV: 91.7 fL (ref 80.0–100.0)
Platelets: 428 10*3/uL — ABNORMAL HIGH (ref 150–400)
RBC: 3.97 MIL/uL — ABNORMAL LOW (ref 4.22–5.81)
RDW: 14.5 % (ref 11.5–15.5)
WBC: 13.2 10*3/uL — ABNORMAL HIGH (ref 4.0–10.5)
nRBC: 0 % (ref 0.0–0.2)

## 2022-12-02 LAB — LEGIONELLA PNEUMOPHILA SEROGP 1 UR AG: L. pneumophila Serogp 1 Ur Ag: NEGATIVE

## 2022-12-02 LAB — URINE CULTURE: Culture: <10000 — AB

## 2022-12-02 LAB — BASIC METABOLIC PANEL
Anion gap: 7 (ref 5–15)
BUN: 9 mg/dL (ref 8–23)
CO2: 28 mmol/L (ref 22–32)
Calcium: 8.1 mg/dL — ABNORMAL LOW (ref 8.9–10.3)
Chloride: 96 mmol/L — ABNORMAL LOW (ref 98–111)
Creatinine, Ser: 0.44 mg/dL — ABNORMAL LOW (ref 0.61–1.24)
GFR, Estimated: >60 mL/min (ref >60)
Glucose, Bld: 105 mg/dL — ABNORMAL HIGH (ref 70–99)
Potassium: 4 mmol/L (ref 3.5–5.1)
Sodium: 131 mmol/L — ABNORMAL LOW (ref 135–145)

## 2022-12-02 LAB — PROTIME-INR
INR: 1.4 — ABNORMAL HIGH (ref 0.8–1.2)
Prothrombin Time: 17.3 s — ABNORMAL HIGH (ref 11.4–15.2)

## 2022-12-02 MED ORDER — POLYETHYLENE GLYCOL 3350 17 G PO PACK
17.0000 g | PACK | Freq: Every day | ORAL | Status: DC
  Administered 2022-12-03 – 2022-12-14 (×11): 17 g via ORAL
  Filled 2022-12-02 (×11): qty 1

## 2022-12-02 MED ORDER — FENTANYL CITRATE (PF) 100 MCG/2ML IJ SOLN
INTRAMUSCULAR | Status: AC
  Filled 2022-12-02: qty 2

## 2022-12-02 MED ORDER — FENTANYL CITRATE (PF) 100 MCG/2ML IJ SOLN
INTRAMUSCULAR | Status: AC | PRN
Start: 2022-12-02 — End: 2022-12-02
  Administered 2022-12-02: 25 ug via INTRAVENOUS

## 2022-12-02 MED ORDER — MIDAZOLAM HCL 2 MG/2ML IJ SOLN
INTRAMUSCULAR | Status: AC | PRN
Start: 2022-12-02 — End: 2022-12-02
  Administered 2022-12-02: .5 mg via INTRAVENOUS

## 2022-12-02 MED ORDER — STERILE WATER FOR INJECTION IJ SOLN
5.0000 mg | Freq: Once | RESPIRATORY_TRACT | Status: AC
  Administered 2022-12-02: 5 mg via INTRAPLEURAL
  Filled 2022-12-02: qty 5

## 2022-12-02 MED ORDER — SODIUM CHLORIDE (PF) 0.9 % IJ SOLN
10.0000 mg | Freq: Once | INTRAMUSCULAR | Status: AC
  Administered 2022-12-02: 10 mg via INTRAPLEURAL
  Filled 2022-12-02: qty 10

## 2022-12-02 MED ORDER — SODIUM CHLORIDE 0.9 % IV SOLN: INTRAVENOUS | Status: DC

## 2022-12-02 MED ORDER — SODIUM CHLORIDE 0.9% FLUSH
10.0000 mL | Freq: Three times a day (TID) | INTRAVENOUS | Status: DC
  Administered 2022-12-02 – 2022-12-04 (×5): 10 mL via INTRAPLEURAL

## 2022-12-02 MED ORDER — LACTULOSE 10 GM/15ML PO SOLN
10.0000 g | Freq: Two times a day (BID) | ORAL | Status: AC
  Administered 2022-12-02 (×2): 10 g via ORAL
  Filled 2022-12-02: qty 15
  Filled 2022-12-02 (×3): qty 30

## 2022-12-02 MED ORDER — SORBITOL 70 % SOLN: 960.0000 mL | TOPICAL_OIL | Freq: Once | ORAL | Status: DC | PRN

## 2022-12-02 MED ORDER — MIDAZOLAM HCL 2 MG/2ML IJ SOLN
INTRAMUSCULAR | Status: AC
  Filled 2022-12-02: qty 2

## 2022-12-02 NOTE — Consult Note (Signed)
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 His oral temperature is 98.8 F (37.1 C). His blood pressure is 116/74 and his pulse is 84. His respiration is 18 and oxygen saturation is 100%.   Review of Systems: Gen:  Denies  fever, sweats, chills weight loss  HEENT: Denies blurred vision, double vision, ear pain, eye pain, hearing loss, nose bleeds, sore throat Cardiac:  No dizziness, chest pain or heaviness, chest tightness,edema, No JVD Resp: +cough, +sputum production, +shortness of breath,-wheezing, -hemoptysis,  Other:  All other systems negative   Physical Examination:   General Appearance: mild distress  EYES PERRLA, EOM intact.   NECK Supple, No JVD Pulmonary: abnormal breath sounds, + rhonchi Cardiovascular Normal S1,S2.  No m/r/g.   Abdomen: Benign, Soft, non-tender. Neurology UE/LE 0/5 strength Ext pulses intact, cap refill intact ALL OTHER ROS ARE NEGATIVE     Procedure: CT chest tube placement with DORNASE AND ALTEPLASE THERAPY  Day 1 12/02/2022 Written consent obtained for TPA and Dornase Therapy Risk of bleeding and pain explained to patient Consent form signed with wife and nursing witness -CT guided placement of right chest tube, 10Fr locking  Findings-pus from drain approx 150 cc   Chest tube suction turned off I instilled 10 mg of Alteplase and 5 mg of Dornase into chest tube. I have advised patient to move around if possible and take deep breaths We will plan to dwell for 1 hour and then place back to suction      Radiology Reports CT [facility] PLEURAL DRAIN W/INDWELL CATH W/IMG GUIDE  Result Date: 12/02/2022 INDICATION: [id number] Empyema [facility]) [id number] EXAM: CT-guided placement of chest tube TECHNIQUE: Multidetector CT imaging of the chest was performed following the standard protocol without IV  contrast. RADIATION DOSE REDUCTION: This exam was performed according to the departmental dose-optimization program which includes automated exposure control, adjustment of the mA and/or kV according to patient size and/or use of iterative reconstruction technique. MEDICATIONS: Documented in the EMR ANESTHESIA/SEDATION: Moderate (conscious) sedation was employed during this procedure. A total of Versed 0.5 mg and Fentanyl 25 mcg was administered intravenously by the radiology nurse. Total intra-service moderate Sedation Time: 25 minutes. The patient's level of consciousness and vital signs were monitored continuously by radiology nursing throughout the procedure under my direct supervision. COMPLICATIONS: None immediate. PROCEDURE: Informed written consent was obtained from the patient after a thorough discussion of the procedural risks, benefits and alternatives. All questions were addressed. Maximal Sterile Barrier Technique was utilized including caps, mask, sterile gowns, sterile gloves, sterile drape, hand hygiene and skin antiseptic. A timeout was performed prior to the initiation of the procedure. The patient was placed lateral decubitus on the exam table. Limited CT of the chest was performed for planning purposes. This demonstrated large loculated right pleural collection. Skin entry site was marked, and the overlying skin was prepped and draped in the standard sterile fashion. Local analgesia was obtained with 1% lidocaine. Using intermittent CT fluoroscopy, a 19 gauge Yueh catheter was advanced into the identified collection via a posterolateral approach. Location was confirmed with CT and return of purulent material. A sample was collected and sent to the lab for microbiology analysis. Over an Amplatz guidewire advanced through the access needle, percutaneous tract was serially dilated to accommodate a 10 [place] locking  drainage catheter. Locking loop was formed, and location in the collection was  confirmed with CT and return of additional purulent material. The drainage catheter was secured to the skin using silk suture and a dressing. It attached to a chest tube drainage system. The patient tolerated the procedure well, and was transferred to recovery in stable condition. IMPRESSION: Successful CT-guided placement of a 10 French locking drainage catheter into the right pleural collection, yielding purulent material consistent with an empyema. Sample sent for microbiology analysis. Electronically Signed   By: Olive Bass M.D.   On: 12/02/2022 11:53   CT CHEST ABDOMEN PELVIS W CONTRAST  Result Date: 12/01/2022 CLINICAL DATA:  Abnormal lung x-ray EXAM: CT CHEST, ABDOMEN, AND PELVIS WITH CONTRAST TECHNIQUE: Multidetector CT imaging of the chest, abdomen and pelvis was performed following the standard protocol during bolus administration of intravenous contrast. RADIATION DOSE REDUCTION: This exam was performed according to the departmental dose-optimization program which includes automated exposure control, adjustment of the mA and/or kV according to patient size and/or use of iterative reconstruction technique. CONTRAST:  75mL OMNIPAQUE IOHEXOL 300 MG/ML  SOLN COMPARISON:  CT abdomen and pelvis 04/10/2021. FINDINGS: CT CHEST FINDINGS Cardiovascular: No significant vascular findings. Normal heart size. No pericardial effusion. Mediastinum/Nodes: Visualized thyroid gland and esophagus are within normal limits. There is an enlarged right hilar lymph node measuring 12 mm short axis. No other enlarged lymph nodes are seen. There secretions in the midtrachea. Lungs/Pleura: Well-circumscribed walled fluid collection is seen in the posterior right hemithorax, most likely within the pleural space. This measures 9.0 x 10.0 x 19.5 cm in largest AP, transverse and craniocaudad dimensions respectively. There is no air within this collection. There some septations in the superior portion of the collection. There is a  adjacent compressive atelectasis of the right lower lobe. There is also atelectasis in the bilateral lung bases and right middle lobe. Patchy airspace opacities are seen in the inferior left lower lobe with some peribronchial wall thickening. Multifocal patchy ground-glass opacities are seen throughout the right upper lobe, likely infectious/inflammatory. There is no evidence for pneumothorax. Musculoskeletal: No chest wall mass or suspicious bone lesions identified. CT ABDOMEN PELVIS FINDINGS Hepatobiliary: Cystic areas in the liver appear unchanged from the prior study. The largest is in the right lobe measuring up to 4.3 cm. The gallbladder is surgically absent. There is no biliary ductal dilatation. Pancreas: Unremarkable. No pancreatic ductal dilatation or surrounding inflammatory changes. Spleen: Normal in size. Cystic areas in the spleen measuring up to 13 mm appear unchanged. Adrenals/Urinary Tract: Suprapubic Foley catheter decompresses the bladder. There is no hydronephrosis or perinephric fluid. Left renal cysts are present measuring up to 16 mm. These are similar to the prior study. The adrenal glands are within normal limits. Stomach/Bowel: There is a large amount of stool in the rectum. The colon is diffusely distended with air without definite transition point concerning for colonic ileus. There is no focal inflammation or wall thickening identified. The appendix is not seen. Additionally there are scattered air-fluid levels throughout small bowel loops without small bowel dilatation. The stomach is decompressed. Vascular/Lymphatic: Aortic atherosclerosis. No enlarged abdominal or pelvic lymph nodes. Reproductive: Prostate is small or absent. Other: There is no ascites. There is no focal abdominal wall hernia. Musculoskeletal: No acute or significant osseous findings. IMPRESSION: 1. Large walled-off fluid collection in the posterior right hemithorax, most likely within the pleural space. Findings are  worrisome for empyema. 2. Patchy airspace opacities in the left lower lobe worrisome for  pneumonia. 3. Patchy ground-glass opacities in the right upper lobe are likely infectious/inflammatory. 4. Right hilar lymphadenopathy, likely reactive. 5. Large amount of stool in the rectum with diffuse gaseous distension of the colon concerning for colonic ileus. No bowel obstruction. 6. Scattered air-fluid levels throughout small bowel loops are nonspecific, but can be seen in the setting of enteritis or ileus. 7. Suprapubic catheter in place. 8. Aortic atherosclerosis. Aortic Atherosclerosis (ICD10-I70.0). Electronically Signed   By: Darliss Cheney M.D.   On: 12/01/2022 00:03   DG Chest Port 1 View  Result Date: 11/30/2022 CLINICAL DATA:  Shortness of breath EXAM: PORTABLE CHEST 1 VIEW COMPARISON:  04/14/2021, 04/17/2021 FINDINGS: Large ovoid opacity in the right thorax. Left lung grossly clear. Normal cardiac size. No pneumothorax. Multiple loops of air distended bowel in the upper abdomen IMPRESSION: Large ovoid opacity in the right thorax, indeterminate for large pulmonary mass or other airspace disease. Recommend contrast-enhanced chest CT for further assessment. Electronically Signed   By: Jasmine Pang M.D.   On: 11/30/2022 21:34          Assessment/Plan:  EMPYEMA AND LUNG ABSCESS DAY ONE fibronolytic therapy  End Stage MS with poor resp effort Oxygen as needed NEB therapy Recommend Chest PT Continue IV abx     GOALS OF CARE DISCUSSION  The Clinical status was relayed to family in detail- Wife  at Bedside  Updated and notified of patients medical condition- Explained to family course of therapy and the modalities   Patient with Progressive multiorgan failure with a very high probablity of a very minimal chance of meaningful recovery despite all aggressive and optimal medical therapy.   She has consented and agreed to DNR/DNI status  Family are satisfied with Plan of action and  management. All questions answered    Lucie Leather, M.D.  [facility]  Medical Director [facility] Medical Director [facility] Cardio-Pulmonary Department

## 2022-12-02 NOTE — Consult Note (Addendum)
WOC Nurse Consult Note: Reason for Consult: Consult requested for "wounds; locations were not specified." Wife at the bedside states Pt previously had significant pressure injuries to right buttock and sacrum which have healed.  Pink dry scar tissue to the areas when assessed.  Foam dressings are in place to protect from further injury.  Left outer leg with chronic full thickness wounds.  He has been using silver hydrofiber and foam dressings prior to admission and I will continue the present plan of care.  Left outer calf with 2 areas of full thickness wounds, separated by narrow bridge of intact skin. 2X1X.2cm and 1X2X.2cm, 50% red, 50% yellow, small amt tan drainage. Right outer leg with partial thickness wound, 3X1X.1cm, red and dry. Dressing procedure/placement/frequency: Topical treatment orders provided for the bedside nurses to perform to protec and promote healing:  1. Cut piece of Aquacel [place] # [phone]) and apply to left leg wounds Q day.  Moisten with NS to remove previous dressing. Cover with foam dressing; change foam dressing Q 3 days or PRN soiling. 2, Foam dressing to right inner buttock, right outer leg, and sacrum/buttocks.  Change Q 3 days or PRN soiling.  Please re-consult if further assistance is needed.  Thank-you,  Cammie Mcgee MSN, RN, CWOCN, [place], CNS [phone]

## 2022-12-02 NOTE — TOC Progression Note (Signed)
Transition of Care [facility]) - Progression Note    Patient Details  Name: Ronald Santiago MRN: [id number] Date of Birth: 05/30/1945  Transition of Care [facility]) CM/SW Contact  Marlowe Sax, RN Phone Number: 12/02/2022, 4:35 PM  Clinical Narrative:    Spoke with wife Angelique Blonder, They would like to get a new hospital bed and needs an air mattress for pressure wounds She stated that he currently has an air mattress, she stated that they have a new wheelchair fully powered, he is bed ridden He has advanced MS and chronic wounds Will need EMS to transport     Expected Discharge Plan: Home/Self Care Barriers to Discharge: Continued Medical Work up  Expected Discharge Plan and Services                                               Social Determinants of Health (SDOH) Interventions SDOH Screenings   Food Insecurity: Patient Unable To Answer (12/01/2022)  Housing: Patient Unable To Answer (12/01/2022)  Transportation Needs: Patient Unable To Answer (12/01/2022)  Utilities: Patient Unable To Answer (12/01/2022)  Financial Resource Strain: Patient Declined (10/28/2022)   Received from [facility]  Social Connections: Unknown (08/05/2021)   Received from [facility]  Tobacco Use: Low Risk  (12/01/2022)  Recent Concern: Tobacco Use - Medium Risk (10/29/2022)   Received from [facility]    Readmission Risk Interventions     No data to display

## 2022-12-02 NOTE — Procedures (Signed)
Interventional Radiology Procedure Note  Date of Procedure: 12/02/2022  Procedure: CT chest tube placement   Findings:  1. Right chest tube placement 10 Fr locking  2. 10 ml purulent fluid sent for microbiology    Complications: No immediate complications noted.   Estimated Blood Loss: minimal  Follow-up and Recommendations: 1. Management per pulmonology    Olive Bass, MD  Vascular & Interventional Radiology  12/02/2022 10:35 AM

## 2022-12-02 NOTE — Evaluation (Signed)
Occupational Therapy Evaluation Patient Details Name: Ronald Santiago MRN: [id number] DOB: Nov 18, 1945 Today's Date: 12/02/2022   History of Present Illness 77 y.o. 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 Patient had been previously evaluated/treated by his PCP for pneumonia with CXR showing right lower lobe infiltrate.   Clinical Impression   Upon entering the room, pt supine in bed and agreeable OT evaluation. She shares that she is pt's primary caregiver at home and pt has been bed level for some time now. All self care tasks performed from bed level. Pt has been getting HHPT services to address LE strength in hopes of transferring back to power chair but he has not been able to do that but twice this year. Caregiver wanting to speak to this therapist about built up utensils so pt can self feed in hospital. OT provided built up universal grip and demonstrated how to apply variety of items for it to be used. She verbalized understanding and does agree that pt is at functional baseline at this time. OT to sign off as pt and caregiver with no further concerns at this time.             Equipment Recommendations  Other (comment) (new hospital bed)       Precautions / Restrictions Precautions Precautions: Fall      Mobility Bed Mobility               General bed mobility comments: total A    Transfers                   General transfer comment: pt has not transfered for some time.          ADL either performed or assessed with clinical judgement   ADL Overall ADL's : At baseline                                       General ADL Comments: total A from caregiver(wife)     Vision  Patient Visual Report: No change from baseline              Pertinent Vitals/Pain Pain Assessment Pain Assessment: No/denies pain           Cognition Arousal: Alert Behavior During Therapy: WFL for tasks assessed/performed Overall Cognitive Status: Within Functional Limits for tasks assessed                                                  Home Living Family/patient expects to be discharged to:: Private residence Living Arrangements: Spouse/significant other Available Help at Discharge: Family;Available 24 hours/day Type of Home: House Home Access: Ramped entrance     Home Layout: One level               Home Equipment: Wheelchair - power;Hospital bed          Prior Functioning/Environment Prior Level of Function : Needs assist               ADLs Comments: total A for all self care  from bed level. PT working with pt on trying to transfer back to power chair.                 OT Goals(Current goals can be found in the care plan section) Acute Rehab OT Goals Patient Stated Goal: to return home OT Goal Formulation: With patient/family Time For Goal Achievement: 12/02/22 Potential to Achieve Goals: Fair  OT Frequency:         AM-PAC OT "6 Clicks" Daily Activity     Outcome Measure Help from another person eating meals?: A Little Help from another person taking care of personal grooming?: A Little Help from another person toileting, which includes using toliet, bedpan, or urinal?: Total Help from another person bathing (including washing, rinsing, drying)?: Total Help from another person to put on and taking off regular upper body clothing?: Total Help from another person to put on and taking off regular lower body clothing?: Total 6 Click Score: 10   End of Session Nurse Communication: Mobility status  Activity Tolerance: Patient tolerated treatment well Patient left: in bed;with call bell/phone within reach;with bed alarm  set;with family/visitor present                   Time: 1610-9604 OT Time Calculation (min): 18 min Charges:  OT General Charges $OT Visit: 1 Visit OT Evaluation $OT Eval Moderate Complexity: 1 [address] [place], OTR/L , CBIS ascom [phone]  12/02/22, 4:11 PM

## 2022-12-02 NOTE — Progress Notes (Signed)
PROGRESS NOTE  Ronald Santiago   HEN:[id number] DOB: [birth year]  DOA: 11/30/2022 Date of Service: 12/02/22 PCP: Mick Sell, MD  Brief Narrative / Hospital Course:  Ronald Santiago is a 77 y.o. 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 Few courses of abx since 08/2022 for presumed PNA/bronchitis.CXR 11/06/22 (+)RLL infiltrate. 11/27/22 SpO2 87% w/ home PT but pt refused to go to ED>  09/09: to ED. SpO2 80s improved on 3L [place] to 96%. WBC 16.8, sodium 128, Chest x-ray was obtained that demonstrated a large ovoid opacity in the right thorax. CT chest/abd/pelv (+) concern for pulmonary empyema, pneumonia, reactive hilar lymphadenopathy, colonic ileus/enteritis. Admitted to hospitalist service.  09/10: IR plan chest tube placement tomorrow, consult to pulmonary (Dr Belia Heman alerted and will see pt).  Chest tube placed without complication. Patient doing well post-procedure. Remains on 2L [place]   Consultants:  Interventional radiology Pulmonology    Procedures: Chest tube placement 9/11    ASSESSMENT & PLAN:  Principal Problem:   Acute hypoxic respiratory failure (HCC) Active Problems:   Lung mass   Hyponatremia   Leukocytosis   Stage IV decubitus ulcer (HCC)   Multiple sclerosis, primary progressive (HCC)   Flaccid neuropathic bladder, not elsewhere classified   Constipation   Acute hypoxic respiratory failure  lung mass/empyema, pneumonia  Continue supplemental oxygen to maintain oxygen saturation above 88%. Wean as tolerated but will likely require discharge with supplemental oxygen Chest PT Treat underlying causes as below    Lung abscess  empyema Telemetry monitoring IR  consult Chest tube placed 9/11. Labs pending.  Pulmonary consult - chest tube management, fibrinolytic therapy day 1 Continue ceftriaxone and azithromycin Sputum culture    Hyponatremia- stable/improving. Na+ 131. PO intake has diminished over the past 1 week, so likely dehydration related, ddx would include SIADH given pulmonary pathology   Monitor sodium   Constipation, concern for ileus- as seen on chest imaging. Had BM 9/11.  Continue home regimen, additional bowel regimen added Adding Enema prn   Flaccid neuropathic bladder, not elsewhere classified Continue suprapubic catheter   Multiple sclerosis, primary progressive (HCC) History of primary progressive MS, currently bedbound. Continue home oxycodone, baclofen and gabapentin Mobilize in bed / OOB to chair as able  Has been made DNI/DNR. Palliative consulted.    Stage IV decubitus ulcer (HCC) Per patient's wife, patient has chronic ulcerations and HH visits to help manage.   routine care WOC consult    Concern for malnutrition/underweight based on BMI: Body mass index is 23.06 kg/m.    DVT prophylaxis: lovenox  IV fluids: LR continuous IV fluids  Nutrition: regular diet Central lines / invasive devices: chronic suprapubic catheter    Code Status: DNR ACP documentation reviewed: none on file in [facility]   TOC needs: none at this time Barriers to dispo / significant pending items: clinical improvement, address lung mass, await cultures, expect will be here few days  Subjective / Brief ROS:  Patient reports some mild tenderness at chest tube insertion site. Denies labored breathing. Overall, he states he is well.   Family Communication: wife is at bedside on rounds   Objective Findings:  Vitals:   12/01/22 1100 12/01/22 1252 12/01/22 1704 12/01/22 2354  BP: 122/72 121/73  136/72 (!) 111/58  Pulse: 86 96 94 84  Resp: 18 19 17 18   Temp: 98.5 F (36.9 C) 98.7 F (37.1 C) 98.4 F (36.9 C) 98.8 F (37.1 C)   TempSrc: Oral     SpO2: 100% 97% 99% 95%  Weight:      Height:        Intake/Output Summary (Last 24 hours) at 12/02/2022 0803 Last data filed at 12/02/2022 4098 Gross per 24 hour  Intake 650 ml  Output 1150 ml  Net -500 ml   Filed Weights   11/30/22 1910  Weight: 77.1 kg    Examination:  Physical Exam Constitutional:      General: He is not in acute distress. Cardiovascular:     Rate and Rhythm: Normal rate and regular rhythm.  Pulmonary:     Breath sounds: Examination of the right-middle field reveals decreased breath sounds. Examination of the right-lower field reveals decreased breath sounds. Decreased breath sounds present.     Comments: Chest tube in place right side. Green opaque drainage Abdominal:     Palpations: Abdomen is soft.     Tenderness: There is no guarding or rebound.  Musculoskeletal:     Right lower leg: No edema.     Left lower leg: No edema.  Skin:    General: Skin is warm and dry.  Neurological:     Mental Status: He is alert and oriented to person, place, and time.  Psychiatric:        Mood and Affect: Mood normal.        Behavior: Behavior normal.     Scheduled Medications:   baclofen  20 mg Oral BID WC   baclofen  40 mg Oral QHS   docusate sodium  400 mg Oral QHS   enoxaparin (LOVENOX) injection  40 mg Subcutaneous Q24H   gabapentin  1,200 mg Oral TID   loratadine  10 mg Oral Daily   magnesium oxide  400 mg Oral Daily   naloxegol oxalate  25 mg Oral Daily   oxybutynin  5 mg Oral TID   oxyCODONE  10 mg Oral Q8H   sodium chloride flush  3 mL Intravenous Q12H    Continuous Infusions:  ampicillin-sulbactam (UNASYN) IV 3 g (12/02/22 0622)   azithromycin Stopped (12/01/22 2105)    PRN Medications:  acetaminophen **OR** acetaminophen, chlorpheniramine-HYDROcodone, clonazePAM, ondansetron **OR** ondansetron (ZOFRAN) IV, polyethylene glycol  Antimicrobials from admission:  Anti-infectives (From admission, onward)    Start      Dose/Rate Route Frequency Ordered Stop   12/01/22 0015  Ampicillin-Sulbactam (UNASYN) 3 g in sodium chloride 0.9 % 100 mL IVPB        3 g 200 mL/hr over 30 Minutes Intravenous Every 6 hours 12/01/22 0010     11/30/22 2000  cefTRIAXone (ROCEPHIN) 1 g in sodium chloride 0.9 % 100 mL IVPB  Status:  Discontinued        1 g 200 mL/hr over 30 Minutes Intravenous Every 24 hours 11/30/22 1948 11/30/22 2335   11/30/22 2000  azithromycin (ZITHROMAX) 500 mg in sodium chloride 0.9 % 250 mL IVPB        500 mg 250 mL/hr over 60 Minutes Intravenous Every 24 hours 11/30/22 1948 12/05/22 1959   11/30/22 1945  levofloxacin (LEVAQUIN) IVPB 750 mg  Status:  Discontinued        750 mg 100 mL/hr over 90 Minutes Intravenous  Once 11/30/22 1935 11/30/22 1948       Data Reviewed:  I have personally reviewed the following...  CBC: Recent Labs  Lab 11/30/22 2003 12/01/22 0530  WBC 16.8* 12.2*  NEUTROABS  --  9.8*  HGB 12.0* 9.9*  HCT 37.0* 30.5*  MCV 89.8 91.3  PLT 524* 436*   Basic Metabolic Panel: Recent Labs  Lab 11/30/22 2003 12/01/22 0530 12/01/22 1311  NA 128* 129*  --   K 4.0 3.6 3.7  CL 92* 99  --   CO2 25 24  --   GLUCOSE 110* 100*  --   BUN 11 11  --   CREATININE 0.46* 0.35*  --   CALCIUM 8.5* 7.8*  --       LOS: 1 day    Time spent: 50 min    Garron Eline Jannette Fogo, DO [facility] 12/02/2022, 8:03 AM   If 7PM-7AM, please contact night coverage [email or web address]

## 2022-12-02 NOTE — Plan of Care (Signed)

## 2022-12-02 NOTE — TOC Progression Note (Signed)
Transition of Care [facility]) - Progression Note    Patient Details  Name: Ronald Santiago MRN: [id number] Date of Birth: 1945/09/17  Transition of Care [facility]) CM/SW Contact  Marlowe Sax, RN Phone Number: 12/02/2022, 3:25 PM  Clinical Narrative:    The patient lives at home with his wife  No TOC needs identified at this time  Has chronic wounds  Foam dressings are in place to protect from further injury.  Left outer leg with chronic full thickness wounds Left outer calf with 2 areas of full thickness wounds  Right outer leg with partial thickness wound,  Patient is now a DNR, limited    Expected Discharge Plan: Home/Self Care Barriers to Discharge: Continued Medical Work up  Expected Discharge Plan and Services                                               Social Determinants of Health (SDOH) Interventions SDOH Screenings   Food Insecurity: Patient Unable To Answer (12/01/2022)  Housing: Patient Unable To Answer (12/01/2022)  Transportation Needs: Patient Unable To Answer (12/01/2022)  Utilities: Patient Unable To Answer (12/01/2022)  Financial Resource Strain: Patient Declined (10/28/2022)   Received from [facility]  Social Connections: Unknown (08/05/2021)   Received from [facility]  Tobacco Use: Low Risk  (12/01/2022)  Recent Concern: Tobacco Use - Medium Risk (10/29/2022)   Received from [facility]    Readmission Risk Interventions     No data to display

## 2022-12-03 ENCOUNTER — Other Ambulatory Visit: Payer: Self-pay

## 2022-12-03 DIAGNOSIS — A419 Sepsis, unspecified organism: Secondary | ICD-10-CM | POA: Diagnosis not present

## 2022-12-03 DIAGNOSIS — J189 Pneumonia, unspecified organism: Secondary | ICD-10-CM | POA: Diagnosis not present

## 2022-12-03 DIAGNOSIS — J869 Pyothorax without fistula: Secondary | ICD-10-CM

## 2022-12-03 DIAGNOSIS — G35D Multiple sclerosis, unspecified: Secondary | ICD-10-CM | POA: Diagnosis present

## 2022-12-03 DIAGNOSIS — Z515 Encounter for palliative care: Secondary | ICD-10-CM

## 2022-12-03 DIAGNOSIS — N312 Flaccid neuropathic bladder, not elsewhere classified: Secondary | ICD-10-CM | POA: Diagnosis not present

## 2022-12-03 DIAGNOSIS — J9601 Acute respiratory failure with hypoxia: Secondary | ICD-10-CM | POA: Diagnosis not present

## 2022-12-03 DIAGNOSIS — G35 Multiple sclerosis: Secondary | ICD-10-CM | POA: Diagnosis present

## 2022-12-03 LAB — COMPREHENSIVE METABOLIC PANEL
ALT: 29 U/L (ref 0–44)
AST: 27 U/L (ref 15–41)
Albumin: 2.1 g/dL — ABNORMAL LOW (ref 3.5–5.0)
Alkaline Phosphatase: 58 U/L (ref 38–126)
Anion gap: 8 (ref 5–15)
BUN: 10 mg/dL (ref 8–23)
CO2: 27 mmol/L (ref 22–32)
Calcium: 8 mg/dL — ABNORMAL LOW (ref 8.9–10.3)
Chloride: 95 mmol/L — ABNORMAL LOW (ref 98–111)
Creatinine, Ser: 0.36 mg/dL — ABNORMAL LOW (ref 0.61–1.24)
GFR, Estimated: >60 mL/min (ref >60)
Glucose, Bld: 122 mg/dL — ABNORMAL HIGH (ref 70–99)
Potassium: 3.9 mmol/L (ref 3.5–5.1)
Sodium: 130 mmol/L — ABNORMAL LOW (ref 135–145)
Total Bilirubin: 0.5 mg/dL (ref 0.3–1.2)
Total Protein: 6.9 g/dL (ref 6.5–8.1)

## 2022-12-03 LAB — CBC
HCT: 31.9 % — ABNORMAL LOW (ref 39.0–52.0)
Hemoglobin: 10.4 g/dL — ABNORMAL LOW (ref 13.0–17.0)
MCH: 29.6 pg (ref 26.0–34.0)
MCHC: 32.6 g/dL (ref 30.0–36.0)
MCV: 90.9 fL (ref 80.0–100.0)
Platelets: 416 10*3/uL — ABNORMAL HIGH (ref 150–400)
RBC: 3.51 MIL/uL — ABNORMAL LOW (ref 4.22–5.81)
RDW: 14.5 % (ref 11.5–15.5)
WBC: 14.6 10*3/uL — ABNORMAL HIGH (ref 4.0–10.5)
nRBC: 0 % (ref 0.0–0.2)

## 2022-12-03 MED ORDER — STERILE WATER FOR INJECTION IJ SOLN
5.0000 mg | Freq: Once | RESPIRATORY_TRACT | Status: AC
  Administered 2022-12-03: 5 mg via INTRAPLEURAL
  Filled 2022-12-03: qty 5

## 2022-12-03 MED ORDER — NAPROXEN 500 MG PO TABS
500.0000 mg | ORAL_TABLET | Freq: Two times a day (BID) | ORAL | Status: DC
  Administered 2022-12-03 – 2022-12-14 (×20): 500 mg via ORAL
  Filled 2022-12-03 (×27): qty 1

## 2022-12-03 MED ORDER — SODIUM CHLORIDE 0.9% FLUSH
10.0000 mL | Freq: Three times a day (TID) | INTRAVENOUS | Status: DC
  Administered 2022-12-03 – 2022-12-04 (×3): 10 mL via INTRAPLEURAL

## 2022-12-03 MED ORDER — SODIUM CHLORIDE (PF) 0.9 % IJ SOLN
10.0000 mg | Freq: Once | INTRAMUSCULAR | Status: AC
  Administered 2022-12-03: 10 mg via INTRAPLEURAL
  Filled 2022-12-03: qty 10

## 2022-12-03 NOTE — Progress Notes (Signed)
PROGRESS NOTE  Ronald Santiago   ZOX:[id number] DOB: Jun 18, 1945  DOA: 11/30/2022 Date of Service: 12/03/22 PCP: Mick Sell, MD  Brief Narrative / Hospital Course:  Ronald Santiago is a 77 y.o. 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 Few courses of abx since 08/2022 for presumed PNA/bronchitis.CXR 11/06/22 (+)RLL infiltrate. 11/27/22 SpO2 87% w/ home PT but pt refused to go to ED>  09/09: to ED. SpO2 80s improved on 3L [place] to 96%. WBC 16.8, sodium 128, Chest x-ray was obtained that demonstrated a large ovoid opacity in the right thorax. CT chest/abd/pelv (+) concern for pulmonary empyema, pneumonia, reactive hilar lymphadenopathy, colonic ileus/enteritis. Admitted to hospitalist service.  09/10: IR plan chest tube placement tomorrow, consult to pulmonary (Dr Belia Heman alerted and will see pt).  9/11: Chest tube placed without complication. Patient doing well post-procedure. Remains on 2L [place] 9/12: patient was febrile overnight tmax 100.7. blood cultures remain negative. Culture from pleural sample NGTD. Fibrinolytic therapy.    Consultants:  Interventional radiology Pulmonology    Procedures: Chest tube placement 9/11    ASSESSMENT & PLAN:  Principal Problem:   Acute hypoxic respiratory failure (HCC) Active Problems:   Lung mass   Hyponatremia   Leukocytosis   Stage IV decubitus ulcer (HCC)   Multiple sclerosis, primary progressive (HCC)   Flaccid neuropathic bladder, not elsewhere classified   Constipation   Acute hypoxic respiratory failure  lung mass/empyema, pneumonia  Continue supplemental oxygen to maintain oxygen saturation above 88%. Wean as tolerated but will  likely require discharge with supplemental oxygen Chest PT Treat underlying causes as below    Lung abscess  empyema Telemetry monitoring IR consult Chest tube placed 9/11. Labs pending.  Pulmonary consult - chest tube management, fibrinolytic therapy day 2. Planning follow up CT tomorrow, tentatively  Continue ceftriaxone and azithromycin   Hyponatremia- stable/improving. Na+ 131>130. PO intake has diminished over the past 1 week, so likely dehydration related, ddx would include SIADH given pulmonary pathology   Monitor sodium   Constipation, concern for ileus- as seen on chest imaging. Had multiple small BMs since admission  Continue home regimen, additional bowel regimen added Adding Enema prn   Flaccid neuropathic bladder, not elsewhere classified Continue suprapubic catheter Also on oxybutynin for intermittent bladder spasm which causes urethral urine leakage    Multiple sclerosis, primary progressive (HCC) History of primary progressive MS, currently bedbound. Continue home oxycodone, baclofen and gabapentin At home takes "4 ibuprofen in the morning and 4 in the evening" and has for several years. Discussed the high risk of side effects of this regimen with patient and wife and recommended tylenol and trial of naproxen PRN Mobilize in bed / OOB to chair as able  Has been made DNI/DNR.  Palliative consulted.  Meeting to discuss GOC 9/13   Stage IV decubitus ulcer (HCC) Per patient's wife, patient has chronic ulcerations and HH visits to help manage.   routine care WOC consult    Concern for malnutrition/underweight based on BMI: Body mass index is 23.06 kg/m.    DVT prophylaxis: lovenox  IV fluids: LR continuous IV fluids  Nutrition: regular diet Central lines / invasive devices: chronic suprapubic catheter, R chest tube   Code Status: DNR ACP documentation reviewed:  none on file in [facility]   TOC needs: none at this time Barriers to dispo / significant pending  items: clinical improvement, address lung mass, await cultures, expect will be here few days  Subjective / Brief ROS:  Patient reports "I've been better". Does not describe in further detail other than he's not feeling well.  Family Communication: wife is at bedside on rounds   Objective Findings:  Vitals:   12/03/22 0106 12/03/22 0124 12/03/22 0323 12/03/22 0502  BP: (!) 84/62 (!) 92/59 119/73 111/64  Pulse: 82 83 82 85  Resp: 18 19 20 18   Temp: 98.9 F (37.2 C)  97.7 F (36.5 C) 98.1 F (36.7 C)  TempSrc:   Oral   SpO2: 100% 98% 99% 97%  Weight:      Height:        Intake/Output Summary (Last 24 hours) at 12/03/2022 0739 Last data filed at 12/03/2022 0604 Gross per 24 hour  Intake 470 ml  Output 1170 ml  Net -700 ml   Filed Weights   11/30/22 1910  Weight: 77.1 kg    Examination:  Physical Exam Constitutional:      General: He is not in acute distress. Cardiovascular:     Rate and Rhythm: Normal rate and regular rhythm.  Pulmonary:     Breath sounds: Examination of the right-middle field reveals decreased breath sounds. Examination of the right-lower field reveals decreased breath sounds. Decreased breath sounds present.     Comments: Chest tube in place right side. Green opaque drainage Abdominal:     Palpations: Abdomen is soft.     Tenderness: There is no guarding or rebound.  Musculoskeletal:     Right lower leg: No edema.     Left lower leg: No edema.  Skin:    General: Skin is warm and dry.  Neurological:     Mental Status: He is alert and oriented to person, place, and time.  Psychiatric:        Mood and Affect: Mood normal.        Behavior: Behavior normal.     Scheduled Medications:   baclofen  20 mg Oral BID WC   baclofen  40 mg Oral QHS   enoxaparin (LOVENOX) injection  40 mg Subcutaneous Q24H   gabapentin  1,200 mg Oral TID   loratadine  10 mg Oral Daily   magnesium oxide  400 mg Oral Daily   naloxegol oxalate  25 mg Oral Daily    oxybutynin  5 mg Oral TID   oxyCODONE  10 mg Oral Q8H   polyethylene glycol  17 g Oral Daily   sodium chloride flush  10 mL Intrapleural Q8H   sodium chloride flush  3 mL Intravenous Q12H    Continuous Infusions:  sodium chloride 10 mL/hr at 12/02/22 0907   ampicillin-sulbactam (UNASYN) IV 3 g (12/03/22 0604)   azithromycin 500 mg (12/02/22 2126)    PRN Medications:  acetaminophen **OR** acetaminophen, chlorpheniramine-HYDROcodone, clonazePAM, ondansetron **OR** ondansetron (ZOFRAN) IV, sorbitol, milk of mag, mineral oil, glycerin (SMOG) enema  Antimicrobials from admission:  Anti-infectives (From admission, onward)    Start     Dose/Rate Route Frequency Ordered Stop   12/01/22 0015  Ampicillin-Sulbactam (UNASYN) 3 g in sodium chloride 0.9 % 100 mL IVPB        3 g 200 mL/hr over 30 Minutes Intravenous Every 6 hours 12/01/22 0010     11/30/22 2000  cefTRIAXone (ROCEPHIN) 1 g in sodium chloride 0.9 % 100 mL  IVPB  Status:  Discontinued        1 g 200 mL/hr over 30 Minutes Intravenous Every 24 hours 11/30/22 1948 11/30/22 2335   11/30/22 2000  azithromycin (ZITHROMAX) 500 mg in sodium chloride 0.9 % 250 mL IVPB        500 mg 250 mL/hr over 60 Minutes Intravenous Every 24 hours 11/30/22 1948 12/05/22 1959   11/30/22 1945  levofloxacin (LEVAQUIN) IVPB 750 mg  Status:  Discontinued        750 mg 100 mL/hr over 90 Minutes Intravenous  Once 11/30/22 1935 11/30/22 1948       Data Reviewed:  I have personally reviewed the following...  CBC: Recent Labs  Lab 11/30/22 2003 12/01/22 0530 12/02/22 0751 12/03/22 0450  WBC 16.8* 12.2* 13.2* 14.6*  NEUTROABS  --  9.8*  --   --   HGB 12.0* 9.9* 11.5* 10.4*  HCT 37.0* 30.5* 36.4* 31.9*  MCV 89.8 91.3 91.7 90.9  PLT 524* 436* 428* 416*   Basic Metabolic Panel: Recent Labs  Lab 11/30/22 2003 12/01/22 0530 12/01/22 1311 12/02/22 0751 12/03/22 0450  NA 128* 129*  --  131* 130*  K 4.0 3.6 3.7 4.0 3.9  CL 92* 99  --  96* 95*   CO2 25 24  --  28 27  GLUCOSE 110* 100*  --  105* 122*  BUN 11 11  --  9 10  CREATININE 0.46* 0.35*  --  0.44* 0.36*  CALCIUM 8.5* 7.8*  --  8.1* 8.0*      LOS: 2 days    Time spent: 50 min    Leeroy Bock, DO [facility] 12/03/2022, 7:39 AM   If 7PM-7AM, please contact night coverage [email or web address]

## 2022-12-03 NOTE — Consult Note (Signed)
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 His temperature is 98.5 F (36.9 C). His blood pressure is 100/61 and his pulse is 85. His respiration is 20 and oxygen saturation is 97%.   Review of Systems: Gen:  Denies  fever, sweats, chills weight loss  Resp: +cough, +sputum production, +shortness of breath,-wheezing, -hemoptysis,  Other:  All other systems negative   Physical Examination:   General Appearance: no distress  EYES PERRLA, EOM intact.   NECK Supple, No JVD Pulmonary: abnormal breath sounds, + rhonchi Cardiovascular Normal S1,S2.  No m/r/g.      Procedure: CT chest tube placement with DORNASE AND ALTEPLASE THERAPY  Day 1 therapy 9/11 Written consent obtained for TPA and Dornase Therapy Risk of bleeding and pain explained to patient Consent form signed with wife and nursing witness -CT guided placement of right chest tube, 10Fr locking  Findings-pus from drain approx 150 cc   Chest tube suction turned off I instilled 10 mg of Alteplase and 5 mg of Dornase into chest tube. I have advised patient to move around if possible and take deep breaths We will plan to dwell for 1 hour and then place back to suction     Day 2 therapy 9/12 Chest tube out put approx 110 cc's purulent drainage  Chest tube suction turned off I instilled 10 mg of Alteplase and 5 mg of Dornase into chest tube. I have advised patient to move around if possible and take deep breaths We will plan to dwell for 1 hour and then place back to suction       Radiology Reports CT [facility] PLEURAL DRAIN W/INDWELL CATH W/IMG GUIDE  Result Date: 12/02/2022 INDICATION: [id number] Empyema [facility]) [id number] EXAM: CT-guided placement of chest tube TECHNIQUE: Multidetector CT imaging of the chest was performed following the standard protocol without IV  contrast. RADIATION DOSE REDUCTION: This exam was performed according to the departmental dose-optimization program which includes automated exposure control, adjustment of the mA and/or kV according to patient size and/or use of iterative reconstruction technique. MEDICATIONS: Documented in the EMR ANESTHESIA/SEDATION: Moderate (conscious) sedation was employed during this procedure. A total of Versed 0.5 mg and Fentanyl 25 mcg was administered intravenously by the radiology nurse. Total intra-service moderate Sedation Time: 25 minutes. The patient's level of consciousness and vital signs were monitored continuously by radiology nursing throughout the procedure under my direct supervision. COMPLICATIONS: None immediate. PROCEDURE: Informed written consent was obtained from the patient after a thorough discussion of the procedural risks, benefits and alternatives. All questions were addressed. Maximal Sterile Barrier Technique was utilized including caps, mask, sterile gowns, sterile gloves, sterile drape, hand hygiene and skin antiseptic. A timeout was performed prior to the initiation of the procedure. The patient was placed lateral decubitus on the exam table. Limited CT of the chest was performed for planning purposes. This demonstrated large loculated right pleural collection. Skin entry site was marked, and the overlying skin was prepped and draped in the standard sterile fashion. Local analgesia was obtained with 1% lidocaine. Using intermittent CT fluoroscopy, a 19 gauge Yueh catheter was advanced into the identified collection via a posterolateral approach. Location was confirmed with CT and return of purulent material. A sample was collected and sent to the lab for microbiology analysis. Over an Amplatz  guidewire advanced through the access needle, percutaneous tract was serially dilated to accommodate a 10 French locking drainage catheter. Locking loop was formed, and location in the collection was  confirmed with CT and return of additional purulent material. The drainage catheter was secured to the skin using silk suture and a dressing. It attached to a chest tube drainage system. The patient tolerated the procedure well, and was transferred to recovery in stable condition. IMPRESSION: Successful CT-guided placement of a 10 French locking drainage catheter into the right pleural collection, yielding purulent material consistent with an empyema. Sample sent for microbiology analysis. Electronically Signed   By: Olive Bass M.D.   On: 12/02/2022 11:53        Assessment/Plan:  EMPYEMA AND LUNG ABSCESS 2 days of fibronolytic therapy, plan for another trial of therapy tomorrow and will re-assess with CT scan  End Stage MS with poor resp effort Oxygen as needed NEB therapy Recommend Chest PT Continue IV abx  Lucie Leather, M.D.  [facility]  Medical Director [facility] Medical Director [facility] Cardio-Pulmonary Department

## 2022-12-03 NOTE — Progress Notes (Signed)
Referring Physician(s): Sunnie Nielsen, DO   Supervising Physician: Roanna Banning  Patient Status:  [facility] - In-pt  Reason for visit: Empyema S/p right sided chest tube 9/11  Subjective: Patient denies any complaints at chest tube site, he denies any change in his clinical symptoms since chest tube.   Allergies: Cephalexin  Medications: Prior to Admission medications   Medication Sig Start Date End Date Taking? Authorizing Provider  baclofen (LIORESAL) 20 MG tablet Take 20-40 mg by mouth 3 (three) times daily. (20 mg in morning, 20 mg at noon, and 40 mg at bedtime) 05/21/20  Yes [provider]  cetirizine (ZYRTEC) 10 MG tablet Take 10 mg by mouth daily.   Yes [provider]  Cholecalciferol (D3-1000 PO) Take 1,000 Units by mouth daily.   Yes [provider]  clonazePAM (KLONOPIN) 1 MG tablet Take 1 mg by mouth 2 (two) times daily as needed. 08/31/22  Yes [provider]  docusate sodium (COLACE) 100 MG capsule Take 400 mg by mouth at bedtime.   Yes [provider]  gabapentin (NEURONTIN) 600 MG tablet Take 1,200 mg by mouth 3 (three) times daily. 02/14/19  Yes [provider]  ibuprofen (ADVIL) 200 MG tablet Take 800 mg by mouth in the morning and at bedtime.   Yes [provider]  magnesium oxide (MAG-OX) 400 MG tablet Take 1 tablet by mouth daily.   Yes [provider]  MOVANTIK 25 MG TABS tablet Take 25 mg by mouth daily. 05/21/20  Yes [provider]  Multiple Vitamin (MULTIVITAMIN WITH MINERALS) TABS tablet Take 1 tablet by mouth daily.   Yes [provider]  nystatin (MYCOSTATIN/NYSTOP) powder SMARTSIG:1 Application Topical 2-3 Times Daily 06/13/20  Yes [provider]  oxybutynin (DITROPAN-XL) 10 MG 24 hr tablet Take 1 tablet (10 mg total) by mouth 3 (three) times daily. 07/13/22 07/10/23 Yes MacDiarmid, Lorin Picket, MD  oxyCODONE (OXY IR/ROXICODONE) 5 MG immediate release tablet Take  10 mg by mouth every 8 (eight) hours. 11/26/22 05/25/23 Yes [provider]  Probiotic Product (PROBIOTIC-10 PO) Take by mouth.   Yes [provider]  Pumpkin Seed 500 MG CAPS Take 1,000 mg by mouth daily.   Yes [provider]  Zinc Oxide-Vitamin C (ZINC PLUS VITAMIN C PO) Take 1 tablet by mouth 2 (two) times daily.   Yes [provider]  oxyCODONE (OXY IR/ROXICODONE) 5 MG immediate release tablet Take 5 mg by mouth every 4 (four) hours as needed for moderate pain or severe pain. Patient not taking: Reported on 11/30/2022 03/13/21   [provider]   Vital Signs: BP 100/61   Pulse 85   Temp 98.5 F (36.9 C)   Resp 20   Ht 6' (1.829 m)   Wt 170 lb (77.1 kg)   SpO2 97%   BMI 23.06 kg/m   Physical Exam  General: Alert, sitting in bed, NAD Chest: Right sided chest tube intact currently fibrinolytic dwelling, purulent output in pleur-evac  Imaging: CT [facility] PLEURAL DRAIN W/INDWELL CATH W/IMG GUIDE  Result Date: 12/02/2022 INDICATION: [id number] Empyema (HCC) [id number] EXAM: CT-guided placement of chest tube TECHNIQUE: Multidetector CT imaging of the chest was performed following the standard protocol without IV contrast. RADIATION DOSE REDUCTION: This exam was performed according to the departmental dose-optimization program which includes automated exposure control, adjustment of the mA and/or kV according to patient size and/or use of iterative reconstruction technique. MEDICATIONS: Documented in the EMR ANESTHESIA/SEDATION: Moderate (conscious) sedation was employed during  this procedure. A total of Versed 0.5 mg and Fentanyl 25 mcg was administered intravenously by the radiology nurse. Total intra-service moderate Sedation Time: 25 minutes. The patient's level of consciousness and vital signs were monitored continuously by radiology nursing throughout the procedure under my direct supervision. COMPLICATIONS: None immediate. PROCEDURE: Informed written consent  was obtained from the patient after a thorough discussion of the procedural risks, benefits and alternatives. All questions were addressed. Maximal Sterile Barrier Technique was utilized including caps, mask, sterile gowns, sterile gloves, sterile drape, hand hygiene and skin antiseptic. A timeout was performed prior to the initiation of the procedure. The patient was placed lateral decubitus on the exam table. Limited CT of the chest was performed for planning purposes. This demonstrated large loculated right pleural collection. Skin entry site was marked, and the overlying skin was prepped and draped in the standard sterile fashion. Local analgesia was obtained with 1% lidocaine. Using intermittent CT fluoroscopy, a 19 gauge Yueh catheter was advanced into the identified collection via a posterolateral approach. Location was confirmed with CT and return of purulent material. A sample was collected and sent to the lab for microbiology analysis. Over an Amplatz guidewire advanced through the access needle, percutaneous tract was serially dilated to accommodate a 10 French locking drainage catheter. Locking loop was formed, and location in the collection was confirmed with CT and return of additional purulent material. The drainage catheter was secured to the skin using silk suture and a dressing. It attached to a chest tube drainage system. The patient tolerated the procedure well, and was transferred to recovery in stable condition. IMPRESSION: Successful CT-guided placement of a 10 French locking drainage catheter into the right pleural collection, yielding purulent material consistent with an empyema. Sample sent for microbiology analysis. Electronically Signed   By: Olive Bass M.D.   On: 12/02/2022 11:53   CT CHEST ABDOMEN PELVIS W CONTRAST  Result Date: 12/01/2022 CLINICAL DATA:  Abnormal lung x-ray EXAM: CT CHEST, ABDOMEN, AND PELVIS WITH CONTRAST TECHNIQUE: Multidetector CT imaging of the chest,  abdomen and pelvis was performed following the standard protocol during bolus administration of intravenous contrast. RADIATION DOSE REDUCTION: This exam was performed according to the departmental dose-optimization program which includes automated exposure control, adjustment of the mA and/or kV according to patient size and/or use of iterative reconstruction technique. CONTRAST:  75mL OMNIPAQUE IOHEXOL 300 MG/ML  SOLN COMPARISON:  CT abdomen and pelvis 04/10/2021. FINDINGS: CT CHEST FINDINGS Cardiovascular: No significant vascular findings. Normal heart size. No pericardial effusion. Mediastinum/Nodes: Visualized thyroid gland and esophagus are within normal limits. There is an enlarged right hilar lymph node measuring 12 mm short axis. No other enlarged lymph nodes are seen. There secretions in the midtrachea. Lungs/Pleura: Well-circumscribed walled fluid collection is seen in the posterior right hemithorax, most likely within the pleural space. This measures 9.0 x 10.0 x 19.5 cm in largest AP, transverse and craniocaudad dimensions respectively. There is no air within this collection. There some septations in the superior portion of the collection. There is a adjacent compressive atelectasis of the right lower lobe. There is also atelectasis in the bilateral lung bases and right middle lobe. Patchy airspace opacities are seen in the inferior left lower lobe with some peribronchial wall thickening. Multifocal patchy ground-glass opacities are seen throughout the right upper lobe, likely infectious/inflammatory. There is no evidence for pneumothorax. Musculoskeletal: No chest wall mass or suspicious bone lesions identified. CT ABDOMEN PELVIS FINDINGS Hepatobiliary: Cystic areas in the liver appear unchanged from  the prior study. The largest is in the right lobe measuring up to 4.3 cm. The gallbladder is surgically absent. There is no biliary ductal dilatation. Pancreas: Unremarkable. No pancreatic ductal  dilatation or surrounding inflammatory changes. Spleen: Normal in size. Cystic areas in the spleen measuring up to 13 mm appear unchanged. Adrenals/Urinary Tract: Suprapubic Foley catheter decompresses the bladder. There is no hydronephrosis or perinephric fluid. Left renal cysts are present measuring up to 16 mm. These are similar to the prior study. The adrenal glands are within normal limits. Stomach/Bowel: There is a large amount of stool in the rectum. The colon is diffusely distended with air without definite transition point concerning for colonic ileus. There is no focal inflammation or wall thickening identified. The appendix is not seen. Additionally there are scattered air-fluid levels throughout small bowel loops without small bowel dilatation. The stomach is decompressed. Vascular/Lymphatic: Aortic atherosclerosis. No enlarged abdominal or pelvic lymph nodes. Reproductive: Prostate is small or absent. Other: There is no ascites. There is no focal abdominal wall hernia. Musculoskeletal: No acute or significant osseous findings. IMPRESSION: 1. Large walled-off fluid collection in the posterior right hemithorax, most likely within the pleural space. Findings are worrisome for empyema. 2. Patchy airspace opacities in the left lower lobe worrisome for pneumonia. 3. Patchy ground-glass opacities in the right upper lobe are likely infectious/inflammatory. 4. Right hilar lymphadenopathy, likely reactive. 5. Large amount of stool in the rectum with diffuse gaseous distension of the colon concerning for colonic ileus. No bowel obstruction. 6. Scattered air-fluid levels throughout small bowel loops are nonspecific, but can be seen in the setting of enteritis or ileus. 7. Suprapubic catheter in place. 8. Aortic atherosclerosis. Aortic Atherosclerosis (ICD10-I70.0). Electronically Signed   By: Darliss Cheney M.D.   On: 12/01/2022 00:03   DG Chest Port 1 View  Result Date: 11/30/2022 CLINICAL DATA:  Shortness of  breath EXAM: PORTABLE CHEST 1 VIEW COMPARISON:  04/14/2021, 04/17/2021 FINDINGS: Large ovoid opacity in the right thorax. Left lung grossly clear. Normal cardiac size. No pneumothorax. Multiple loops of air distended bowel in the upper abdomen IMPRESSION: Large ovoid opacity in the right thorax, indeterminate for large pulmonary mass or other airspace disease. Recommend contrast-enhanced chest CT for further assessment. Electronically Signed   By: Jasmine Pang M.D.   On: 11/30/2022 21:34    Labs:  CBC: Recent Labs    11/30/22 2003 12/01/22 0530 12/02/22 0751 12/03/22 0450  WBC 16.8* 12.2* 13.2* 14.6*  HGB 12.0* 9.9* 11.5* 10.4*  HCT 37.0* 30.5* 36.4* 31.9*  PLT 524* 436* 428* 416*    COAGS: Recent Labs    12/02/22 0751  INR 1.4*    BMP: Recent Labs    11/30/22 2003 12/01/22 0530 12/01/22 1311 12/02/22 0751 12/03/22 0450  NA 128* 129*  --  131* 130*  K 4.0 3.6 3.7 4.0 3.9  CL 92* 99  --  96* 95*  CO2 25 24  --  28 27  GLUCOSE 110* 100*  --  105* 122*  BUN 11 11  --  9 10  CALCIUM 8.5* 7.8*  --  8.1* 8.0*  CREATININE 0.46* 0.35*  --  0.44* 0.36*  GFRNONAA >60 >60  --  >60 >60    LIVER FUNCTION TESTS: Recent Labs    12/03/22 0450  BILITOT 0.5  AST 27  ALT 29  ALKPHOS 58  PROT 6.9  ALBUMIN 2.1*    Assessment and Plan: 77 year old with PMHx significant for MS with shortness of  breath, malaise, cough, hypoxia and leukocytosis. CT showed right sided empyema.  S/p successful 5F percutaneous pigtail drainage catheter placed 9/11, patient denies any clinical change in his symptoms today. Wbc elevated 14.6 (13.2), Tmax 100.4, good output 420 cc / 24 hrs of purulent fluid, Cx +, Pulmonary managing with fibrinolytic and antibiotics.   Please call IR with any questions or need for catheter intervention.    Electronically Signed: Berneta Levins, PA-C 12/03/2022, 2:29 PM   I spent a total of 15 Minutes at the the patient's bedside AND on the patient's hospital  floor or unit, greater than 50% of which was counseling/coordinating care for right pleural drain.

## 2022-12-03 NOTE — Plan of Care (Signed)
  Problem: Clinical Measurements: Goal: Ability to maintain clinical measurements within normal limits will improve Outcome: Progressing   

## 2022-12-03 NOTE — Consult Note (Signed)
Consultation Note Date: 12/03/2022 at 1400  Patient Name: Ronald Santiago  DOB: 1945-08-01  MRN: [id number]  Age / Sex: 77 y.o., male  PCP: Mick Sell, MD Referring Physician: Leeroy Bock, MD  Reason for Consultation: Establishing goals of care  HPI/Patient Profile: 77 y.o. male  with past medical history of advanced primary progressive MS, predominantly bedbound, neurogenic bladder with suprapubic catheter, chronic pressure ulcer of the sacrum, chronic pain syndrome, and constipation admitted on 11/30/2022 with low oxygen, worsening cough, unable to clear sputum, and worsening shortness of breath x 1 week with a intermittent fever over 1 month.  08/2022, patient presumed to have pneumonia/bronchitis and followed course of antibiotic treatment.  H/16/2024, chest x-ray revealed right lower lobe infiltrate.  11/27/2022, oxygen reported by home PT is 87% but patient declined transfer to ED.  On admission, chest x-ray revealed large opacity in the right thorax.  CT of chest/abdomen/pelvis revealed concern for pulmonary MPM a, PNA, reactive hilar lymphadenopathy, and colonic ileus/enteritis.  9/10, chest tube placed by IR and patient being followed by CCM.   PMT was consulted to discuss goals of care..   Clinical Assessment and Goals of Care: Extensive chart review completed prior to meeting patient including labs, vital signs, imaging, progress notes, orders, and available advanced directive documents from current and previous encounters.    After reviewing the chart, I assessed the patient at bedside.  He is resting comfortably in no apparent distress.  Respirations are even and unlabored.  He is asleep, easily awakens, and quickly returns back to sleep.  No family or friends present during my visit.  After assessing the patient, I then spoke with his wife Angelique Blonder over the phone to discuss diagnosis  prognosis, GOC, EOL wishes, disposition and options.  I introduced Palliative Medicine as specialized medical care for people living with serious illness. It focuses on providing relief from the symptoms and stress of a serious illness. The goal is to improve quality of life for both the patient and the family.  Discussed chronic and progressive nature of MS in addition to patient's acute lung concerns, chest tube, sacral wounds, and treatment plan. Reviewed CCM Dr. Belia Heman recommendations.   I attempted to elicit values and goals of care important to the patient. Angelique Blonder is concerned that patient is not as mobile as he once was. She is worried that she cannot provide the level of care he now needs given her health concerns. Reviewed that functional, nutritional, and cognitive status contribute to patient's overall prognosis in addition to his chronic, ongoing co-morbidities.     Advance directives, concepts specific to code status, artificial feeding and hydration, and rehospitalization were considered and discussed. Angelique Blonder shares she would like to speak with me regarding these topics and then address them with her husband. She is currently at an oncology appointment for her own cancer treatments at [facility]. She is not in a space/place to discuss GOC at length today.   We plan to meet bedside tomorrow, 9/13, at 1030am, to continue  GOC discussions.   Family is facing treatment option decisions, advanced directive, and anticipatory care needs.    Discussed with Angelique Blonder the importance of continued conversation with family and the medical providers regarding overall plan of care and treatment options, ensuring decisions are within the context of the patient's values and GOCs.    Therapeutic silence, active listening, and emotional support provided.  PMT contact info given and the niece encouraged to call with any acute palliative concerns.  Otherwise, plan to meet with her tomorrow at 1030am bedside to  continue goals of care discussions.  Primary Decision Maker PATIENT  Physical Exam Vitals reviewed.  Constitutional:      General: He is not in acute distress.    Appearance: He is normal weight.  HENT:     Head: Normocephalic.  Cardiovascular:     Rate and Rhythm: Normal rate.  Musculoskeletal:     Comments: Generalized weakness  Skin:    General: Skin is warm and dry.     Coloration: Skin is pale.  Neurological:     Mental Status: He is alert.  Psychiatric:        Mood and Affect: Mood is not anxious.        Behavior: Behavior is not agitated.     Palliative Assessment/Data: 30%     Thank you for this consult. Palliative medicine will continue to follow and assist holistically.   Time Total: 75 minutes  Signed by: Georgiann Cocker, DNP, FNP-BC Palliative Medicine    Please contact Palliative Medicine Team phone at [phone] for questions and concerns.  For individual provider: See Loretha Stapler

## 2022-12-03 NOTE — Progress Notes (Cosign Needed Addendum)
Patient has advanced MS and Chronic Sacral decubitus Ulcers which requires whole Body to be positioned in ways not feasible with a normal bed.

## 2022-12-04 DIAGNOSIS — A419 Sepsis, unspecified organism: Secondary | ICD-10-CM | POA: Diagnosis not present

## 2022-12-04 DIAGNOSIS — J869 Pyothorax without fistula: Secondary | ICD-10-CM | POA: Diagnosis not present

## 2022-12-04 DIAGNOSIS — J189 Pneumonia, unspecified organism: Secondary | ICD-10-CM | POA: Diagnosis not present

## 2022-12-04 DIAGNOSIS — G35 Multiple sclerosis: Secondary | ICD-10-CM | POA: Diagnosis not present

## 2022-12-04 DIAGNOSIS — J9601 Acute respiratory failure with hypoxia: Secondary | ICD-10-CM | POA: Diagnosis not present

## 2022-12-04 DIAGNOSIS — R059 Cough, unspecified: Secondary | ICD-10-CM | POA: Diagnosis not present

## 2022-12-04 LAB — CBC
HCT: 27.7 % — ABNORMAL LOW (ref 39.0–52.0)
Hemoglobin: 9 g/dL — ABNORMAL LOW (ref 13.0–17.0)
MCH: 29.2 pg (ref 26.0–34.0)
MCHC: 32.5 g/dL (ref 30.0–36.0)
MCV: 89.9 fL (ref 80.0–100.0)
Platelets: 374 10*3/uL (ref 150–400)
RBC: 3.08 MIL/uL — ABNORMAL LOW (ref 4.22–5.81)
RDW: 14.5 % (ref 11.5–15.5)
WBC: 9.8 10*3/uL (ref 4.0–10.5)
nRBC: 0 % (ref 0.0–0.2)

## 2022-12-04 LAB — BASIC METABOLIC PANEL
Anion gap: 7 (ref 5–15)
BUN: 11 mg/dL (ref 8–23)
CO2: 29 mmol/L (ref 22–32)
Calcium: 7.7 mg/dL — ABNORMAL LOW (ref 8.9–10.3)
Chloride: 96 mmol/L — ABNORMAL LOW (ref 98–111)
Creatinine, Ser: 0.34 mg/dL — ABNORMAL LOW (ref 0.61–1.24)
GFR, Estimated: >60 mL/min (ref >60)
Glucose, Bld: 106 mg/dL — ABNORMAL HIGH (ref 70–99)
Potassium: 3.8 mmol/L (ref 3.5–5.1)
Sodium: 132 mmol/L — ABNORMAL LOW (ref 135–145)

## 2022-12-04 MED ORDER — STERILE WATER FOR INJECTION IJ SOLN
5.0000 mg | Freq: Once | RESPIRATORY_TRACT | Status: AC
  Administered 2022-12-04: 5 mg via INTRAPLEURAL
  Filled 2022-12-04: qty 5

## 2022-12-04 MED ORDER — SODIUM CHLORIDE (PF) 0.9 % IJ SOLN
10.0000 mg | Freq: Once | INTRAMUSCULAR | Status: AC
  Administered 2022-12-04: 10 mg via INTRAPLEURAL
  Filled 2022-12-04: qty 10

## 2022-12-04 MED ORDER — SODIUM CHLORIDE 0.9% FLUSH
20.0000 mL | Freq: Four times a day (QID) | INTRAVENOUS | Status: DC
  Administered 2022-12-04 – 2022-12-10 (×24): 20 mL via INTRAPLEURAL

## 2022-12-04 MED ORDER — CARBAMIDE PEROXIDE 6.5 % OT SOLN
5.0000 [drp] | Freq: Two times a day (BID) | OTIC | Status: AC
  Administered 2022-12-04 – 2022-12-06 (×6): 5 [drp] via OTIC
  Filled 2022-12-04: qty 15

## 2022-12-04 NOTE — TOC Progression Note (Signed)
Transition of Care [facility]) - Progression Note    Patient Details  Name: Ronald Santiago MRN: [id number] Date of Birth: April 05, 1945  Transition of Care [facility]) CM/SW Contact  Marlowe Sax, RN Phone Number: 12/04/2022, 12:08 PM  Clinical Narrative:     The patient's wofe os asking for a new hospital bed and air mattress for the advanced MS and chronic Decubitus ulcers, I requested orders, nopt yet signed by MD TOC to continue to follow and will get DME set up once orders signed  Expected Discharge Plan: Home/Self Care Barriers to Discharge: Continued Medical Work up  Expected Discharge Plan and Services                                               Social Determinants of Health (SDOH) Interventions SDOH Screenings   Food Insecurity: Patient Unable To Answer (12/01/2022)  Housing: Patient Unable To Answer (12/01/2022)  Transportation Needs: Patient Unable To Answer (12/01/2022)  Utilities: Patient Unable To Answer (12/01/2022)  Financial Resource Strain: Patient Declined (10/28/2022)   Received from [facility]  Social Connections: Unknown (08/05/2021)   Received from [facility]  Tobacco Use: Low Risk  (12/01/2022)  Recent Concern: Tobacco Use - Medium Risk (10/29/2022)   Received from [facility]    Readmission Risk Interventions     No data to display

## 2022-12-04 NOTE — Plan of Care (Signed)

## 2022-12-04 NOTE — Care Management Important Message (Signed)
Important Message  Patient Details  Name: Ronald Santiago MRN: [id number] Date of Birth: 1946-01-18   Medicare Important Message Given:  Yes     Olegario Messier A Leshon Armistead 12/04/2022, 10:59 AM

## 2022-12-04 NOTE — Progress Notes (Signed)
PROGRESS NOTE  Ronald Santiago   ZOX:[id number] DOB: August 25, 1945  DOA: 11/30/2022 Date of Service: 12/04/22 PCP: Mick Sell, MD  Brief Narrative / Hospital Course:  Ronald Santiago is a 77 y.o. 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 Few courses of abx since 08/2022 for presumed PNA/bronchitis.CXR 11/06/22 (+)RLL infiltrate. 11/27/22 SpO2 87% w/ home PT but pt refused to go to ED>  09/09: to ED. SpO2 80s improved on 3L [place] to 96%. WBC 16.8, sodium 128, Chest x-ray was obtained that demonstrated a large ovoid opacity in the right thorax. CT chest/abd/pelv (+) concern for pulmonary empyema, pneumonia, reactive hilar lymphadenopathy, colonic ileus/enteritis. Admitted to hospitalist service.  09/10: IR plan chest tube placement tomorrow, consult to pulmonary (Dr Belia Heman alerted and will see pt).  9/11: Chest tube placed without complication. Patient doing well post-procedure. Remains on 2L [place] 9/12: patient was febrile overnight tmax 100.7. blood cultures remain negative. Culture from pleural sample NGTD. Fibrinolytic therapy.  9/13: patient feeling better today. Afebrile >24 hours. Culture positive for streptococcus constellatus. Fibrinolytic therapy with follow up CT chest today. Palliative meeting with patient and wife today.    Consultants:  Interventional radiology Pulmonology    Procedures: Chest tube placement 9/11    ASSESSMENT & PLAN:  Principal Problem:   Acute hypoxic respiratory failure (HCC) Active Problems:   Lung mass   Hyponatremia   Leukocytosis   Stage IV decubitus ulcer (HCC)   Multiple sclerosis, primary progressive (HCC)   Flaccid neuropathic bladder, not  elsewhere classified   Constipation   Acute hypoxic respiratory failure  lung mass/empyema, pneumonia  Continue supplemental oxygen to maintain oxygen saturation above 88%. Wean as tolerated but will likely require discharge with supplemental oxygen Chest PT Treat underlying causes as below    Lung abscess  empyema Telemetry monitoring IR consult Chest tube placed 9/11. Labs pending.  Pulmonary consult - chest tube management, fibrinolytic therapy day 3. Planning follow up CT today Continue ceftriaxone and azithromycin  Anemia of chronic disease- hgb steadily dropping. 11.5>10.4>9.0. no sign of acute bleeding. No clear blood in pleural drainage Transfusion threshold 7 - monitor CBC   Hyponatremia- stable/improving. Na+ 131>130>132. PO intake has diminished over the past 1 week, so likely dehydration related, ddx would include SIADH given pulmonary pathology   Monitor sodium   Constipation, concern for ileus- as seen on chest imaging. Had multiple small BMs since admission  Continue home regimen, additional bowel regimen added Adding Enema prn   Flaccid neuropathic bladder, not elsewhere classified Continue suprapubic catheter Also on oxybutynin for intermittent bladder spasm which causes urethral urine leakage    Multiple sclerosis, primary progressive (HCC) History of primary progressive MS, currently bedbound. Continue home oxycodone, baclofen and gabapentin At home takes "4 ibuprofen in the morning and 4 in the evening" and has for several years. Discussed the high risk of side effects of this regimen with patient and wife and recommended tylenol and trial of naproxen PRN Mobilize in bed / OOB to chair as able  Has been made DNI/DNR.  Palliative consulted.  Meeting to discuss GOC 9/13   Stage IV decubitus ulcer (HCC) Per patient's wife, patient has chronic ulcerations and HH visits to help manage.   routine care  WOC consult    Concern for malnutrition/underweight  based on BMI: Body mass index is 23.06 kg/m.   Hearing decrease- likely cerumen impaction - examine with otoscope - debrox drops   DVT prophylaxis: lovenox  IV fluids: LR continuous IV fluids  Nutrition: regular diet Central lines / invasive devices: chronic suprapubic catheter, R chest tube   Code Status: DNR ACP documentation reviewed: none on file in [facility]   TOC needs: none at this time Barriers to dispo / significant pending items: clinical improvement, address lung mass, await cultures, expect will be here few days  Subjective / Brief ROS:  Patient reports that he's feeling much better today. His arthritis pain was well controlled with naproxen yesterday.   Family Communication: wife is at bedside on rounds   Objective Findings:  Vitals:   12/03/22 1141 12/03/22 1313 12/03/22 1606 12/03/22 2254  BP: 103/65 100/61 106/70 96/62  Pulse: 99 85 73 79  Resp: 20  14 16   Temp: 98.5 F (36.9 C)  (!) 97.5 F (36.4 C) 97.6 F (36.4 C)  TempSrc:      SpO2: 96% 97% 98% 98%  Weight:      Height:        Intake/Output Summary (Last 24 hours) at 12/04/2022 0750 Last data filed at 12/04/2022 0700 Gross per 24 hour  Intake 1679.33 ml  Output 1220 ml  Net 459.33 ml   Filed Weights   11/30/22 1910  Weight: 77.1 kg    Examination:  Physical Exam Constitutional:      General: He is not in acute distress. Cardiovascular:     Rate and Rhythm: Normal rate and regular rhythm.  Pulmonary:     Breath sounds: Examination of the right-middle field reveals decreased breath sounds. Examination of the right-lower field reveals decreased breath sounds. Decreased breath sounds present.     Comments: Chest tube in place right side. Green opaque drainage Abdominal:     Palpations: Abdomen is soft.     Tenderness: There is no guarding or rebound.  Musculoskeletal:     Right lower leg: No edema.     Left lower leg: No edema.  Skin:    General: Skin is warm and dry.  Neurological:      Mental Status: He is alert and oriented to person, place, and time.  Psychiatric:        Mood and Affect: Mood normal.        Behavior: Behavior normal.    Scheduled Medications:   baclofen  20 mg Oral BID WC   baclofen  40 mg Oral QHS   enoxaparin (LOVENOX) injection  40 mg Subcutaneous Q24H   gabapentin  1,200 mg Oral TID   loratadine  10 mg Oral Daily   magnesium oxide  400 mg Oral Daily   naloxegol oxalate  25 mg Oral Daily   naproxen  500 mg Oral BID WC   oxybutynin  5 mg Oral TID   oxyCODONE  10 mg Oral Q8H   polyethylene glycol  17 g Oral Daily   sodium chloride flush  10 mL Intrapleural Q8H   sodium chloride flush  10 mL Intrapleural Q8H   sodium chloride flush  3 mL Intravenous Q12H    Continuous Infusions:  sodium chloride 10 mL/hr at 12/04/22 0503   ampicillin-sulbactam (UNASYN) IV 3 g (12/04/22 0534)   azithromycin Stopped (12/03/22 2224)    PRN Medications:  acetaminophen **OR** acetaminophen, chlorpheniramine-HYDROcodone, clonazePAM, ondansetron **OR** ondansetron (ZOFRAN) IV, sorbitol, milk of  mag, mineral oil, glycerin (SMOG) enema  Antimicrobials from admission:  Anti-infectives (From admission, onward)    Start     Dose/Rate Route Frequency Ordered Stop   12/01/22 0015  Ampicillin-Sulbactam (UNASYN) 3 g in sodium chloride 0.9 % 100 mL IVPB        3 g 200 mL/hr over 30 Minutes Intravenous Every 6 hours 12/01/22 0010     11/30/22 2000  cefTRIAXone (ROCEPHIN) 1 g in sodium chloride 0.9 % 100 mL IVPB  Status:  Discontinued        1 g 200 mL/hr over 30 Minutes Intravenous Every 24 hours 11/30/22 1948 11/30/22 2335   11/30/22 2000  azithromycin (ZITHROMAX) 500 mg in sodium chloride 0.9 % 250 mL IVPB        500 mg 250 mL/hr over 60 Minutes Intravenous Every 24 hours 11/30/22 1948 12/05/22 1959   11/30/22 1945  levofloxacin (LEVAQUIN) IVPB 750 mg  Status:  Discontinued        750 mg 100 mL/hr over 90 Minutes Intravenous  Once 11/30/22 1935 11/30/22 1948        Data Reviewed:  I have personally reviewed the following...  CBC: Recent Labs  Lab 11/30/22 2003 12/01/22 0530 12/02/22 0751 12/03/22 0450 12/04/22 0449  WBC 16.8* 12.2* 13.2* 14.6* 9.8  NEUTROABS  --  9.8*  --   --   --   HGB 12.0* 9.9* 11.5* 10.4* 9.0*  HCT 37.0* 30.5* 36.4* 31.9* 27.7*  MCV 89.8 91.3 91.7 90.9 89.9  PLT 524* 436* 428* 416* 374   Basic Metabolic Panel: Recent Labs  Lab 11/30/22 2003 12/01/22 0530 12/01/22 1311 12/02/22 0751 12/03/22 0450 12/04/22 0449  NA 128* 129*  --  131* 130* 132*  K 4.0 3.6 3.7 4.0 3.9 3.8  CL 92* 99  --  96* 95* 96*  CO2 25 24  --  28 27 29   GLUCOSE 110* 100*  --  105* 122* 106*  BUN 11 11  --  9 10 11   CREATININE 0.46* 0.35*  --  0.44* 0.36* 0.34*  CALCIUM 8.5* 7.8*  --  8.1* 8.0* 7.7*      LOS: 3 days    Time spent: 50 min    Nasreen Goedecke Jannette Fogo, DO [facility] 12/04/2022, 7:50 AM   If 7PM-7AM, please contact night coverage [email or web address]

## 2022-12-04 NOTE — Progress Notes (Signed)
NAME:  Ronald Santiago, MRN:  [id number], DOB:  03-Jul-1945, LOS: 3 ADMISSION DATE:  11/30/2022, CHIEF COMPLAINT:  Empyema   History of Present Illness:  Patient is a 77 year old male presenting to the hospital with shortness of breath and hypoxemia.  He was diagnosed with empyema and admitted to medicine.  Chest tube has been placed and the patient was initiated on intrapleural fibrinolytic therapy.  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  Patient has had multiple recent pneumonias for which she required antibiotic therapy.  He presented to the ED with increased shortness of breath, cough, and fever.  He was noted to have leukocytosis with a chest x-ray concerning for an opacity in the right lower lung field.  IR guided 10 French chest tube was placed on 9/10 and the patient has received 2 doses of fibrinolytic therapy on 9/11 and 9/12.  Today, he feels unchanged.  He continues to have pain in his chest and continues to feel weak.  He is more alert and awake compared to prior.  Pertinent  Medical History  Multiple sclerosis Suprapubic catheter Pressure ulcer of the sacrum chronic pain Pneumonia   Objective   Blood pressure 99/61, pulse 83, temperature 97.6 F (36.4 C), resp. rate 17, height 6' (1.829 m), weight 77.1 kg, SpO2 96%.        Intake/Output Summary (Last 24 hours) at 12/04/2022 1142 Last data filed at 12/04/2022 0900 Gross per 24 hour  Intake 1679.33 ml  Output 1220 ml  Net 459.33 ml   Filed Weights   11/30/22 1910  Weight: 77.1 kg    Examination: Physical Exam Constitutional:      General: He is not in acute distress.    Appearance: He is ill-appearing.  HENT:     Mouth/Throat:     Mouth: Mucous membranes are moist.  Cardiovascular:     Pulses: Normal pulses.     Heart sounds: Normal heart sounds.  Pulmonary:     Effort: Pulmonary effort is  normal.     Breath sounds: Normal breath sounds. No wheezing or rales.     Comments: On anterior auscultation.  On examination of the right chest wall, a 10 French chest tube is noted in place. Musculoskeletal:     Right lower leg: No edema.     Left lower leg: No edema.  Neurological:     General: No focal deficit present.     Mental Status: He is alert and oriented to person, place, and time. Mental status is at baseline.      Assessment & Plan:   #Acute Hypoxic Respiratory Failure #Right Sided Empyema  Presented with hypoxic respiratory failure found to have a right-sided concerning for empyema.  He is status postplacement of a chest tube under CT guidance to the right pleural collection with the drainage of pus.  Culture has been sent and Gram stain is positive, pending speciation.  He is on broad-spectrum antibiotics with improvement in his fever trend and white count.  He has received 2 doses of fibrinolytic therapy (tPA/DNase) and I will administer a third dose today.  I have attached three-way stopcock in line with the chest tube to facilitate saline flushes.  I have updated the saline flushing orders to every 6 hours and have educated the nursing staff on how this should be done.  Following instillation of the third dose of tPA/DNase, we will consider repeat imaging with a CT  scan of the chest to evaluate for any residual collections and assess for lung reexpansion.  -Check ESR/CRP/procalcitonin -NS flushes intrapleurally every 6 hours (15 mL towards the patient, 5 mL towards the Pleur-evac) -tPA/DNase today -Continue broad-spectrum antibiotics -Our team will continue to follow  Raechel Chute, MD [facility] 12/04/2022 3:05 PM    Labs   CBC: Recent Labs  Lab 11/30/22 2003 12/01/22 0530 12/02/22 0751 12/03/22 0450 12/04/22 0449  WBC 16.8* 12.2* 13.2* 14.6* 9.8  NEUTROABS  --  9.8*  --   --   --   HGB 12.0* 9.9* 11.5* 10.4* 9.0*  HCT 37.0* 30.5*  36.4* 31.9* 27.7*  MCV 89.8 91.3 91.7 90.9 89.9  PLT 524* 436* 428* 416* 374    Basic Metabolic Panel: Recent Labs  Lab 11/30/22 2003 12/01/22 0530 12/01/22 1311 12/02/22 0751 12/03/22 0450 12/04/22 0449  NA 128* 129*  --  131* 130* 132*  K 4.0 3.6 3.7 4.0 3.9 3.8  CL 92* 99  --  96* 95* 96*  CO2 25 24  --  28 27 29   GLUCOSE 110* 100*  --  105* 122* 106*  BUN 11 11  --  9 10 11   CREATININE 0.46* 0.35*  --  0.44* 0.36* 0.34*  CALCIUM 8.5* 7.8*  --  8.1* 8.0* 7.7*   GFR: Estimated Creatinine Clearance: 87 mL/min (A) (by C-G formula based on SCr of 0.34 mg/dL (L)). Recent Labs  Lab 11/30/22 2003 12/01/22 0530 12/02/22 0751 12/03/22 0450 12/04/22 0449  WBC 16.8* 12.2* 13.2* 14.6* 9.8  LATICACIDVEN 1.1  --   --   --   --     Liver Function Tests: Recent Labs  Lab 12/03/22 0450  AST 27  ALT 29  ALKPHOS 58  BILITOT 0.5  PROT 6.9  ALBUMIN 2.1*   No results for input(s): "LIPASE", "AMYLASE" in the last 168 hours. No results for input(s): "AMMONIA" in the last 168 hours.  ABG No results found for: "PHART", "PCO2ART", "PO2ART", "HCO3", "TCO2", "ACIDBASEDEF", "O2SAT"   Coagulation Profile: Recent Labs  Lab 12/02/22 0751  INR 1.4*    Cardiac Enzymes: No results for input(s): "CKTOTAL", "CKMB", "CKMBINDEX", "TROPONINI" in the last 168 hours.  HbA1C: No results found for: "HGBA1C"  CBG: No results for input(s): "GLUCAP" in the last 168 hours.   Past Medical History:  He,  has a past medical history of Chronic kidney disease and Multiple sclerosis, primary progressive (HCC).   Surgical History:   Past Surgical History:  Procedure Laterality Date   BACK SURGERY     CHOLECYSTECTOMY     EYE SURGERY       Social History:   reports that he has never smoked. He has never been exposed to tobacco smoke. He has never used smokeless tobacco. He reports that he does not drink alcohol and does not use drugs.   Family History:  His family history is not on  file.   Allergies Allergies  Allergen Reactions   Cephalexin Anaphylaxis and Hives    Pt tolerated several doses of cefepime during hospitalization in 03/2021     Home Medications  Prior to Admission medications   Medication Sig Start Date End Date Taking? Authorizing Provider  baclofen (LIORESAL) 20 MG tablet Take 20-40 mg by mouth 3 (three) times daily. (20 mg in morning, 20 mg at noon, and 40 mg at bedtime) 05/21/20  Yes [provider]  cetirizine (ZYRTEC) 10 MG tablet Take 10 mg by mouth daily.  Yes [provider]  Cholecalciferol (D3-1000 PO) Take 1,000 Units by mouth daily.   Yes [provider]  clonazePAM (KLONOPIN) 1 MG tablet Take 1 mg by mouth 2 (two) times daily as needed. 08/31/22  Yes [provider]  docusate sodium (COLACE) 100 MG capsule Take 400 mg by mouth at bedtime.   Yes [provider]  gabapentin (NEURONTIN) 600 MG tablet Take 1,200 mg by mouth 3 (three) times daily. 02/14/19  Yes [provider]  ibuprofen (ADVIL) 200 MG tablet Take 800 mg by mouth in the morning and at bedtime.   Yes [provider]  magnesium oxide (MAG-OX) 400 MG tablet Take 1 tablet by mouth daily.   Yes [provider]  MOVANTIK 25 MG TABS tablet Take 25 mg by mouth daily. 05/21/20  Yes [provider]  Multiple Vitamin (MULTIVITAMIN WITH MINERALS) TABS tablet Take 1 tablet by mouth daily.   Yes [provider]  nystatin (MYCOSTATIN/NYSTOP) powder SMARTSIG:1 Application Topical 2-3 Times Daily 06/13/20  Yes [provider]  oxybutynin (DITROPAN-XL) 10 MG 24 hr tablet Take 1 tablet (10 mg total) by mouth 3 (three) times daily. 07/13/22 07/10/23 Yes MacDiarmid, Lorin Picket, MD  oxyCODONE (OXY IR/ROXICODONE) 5 MG immediate release tablet Take 10 mg by mouth every 8 (eight) hours. 11/26/22 05/25/23 Yes [provider]  Probiotic Product (PROBIOTIC-10 PO) Take by mouth.   Yes [provider]   Pumpkin Seed 500 MG CAPS Take 1,000 mg by mouth daily.   Yes [provider]  Zinc Oxide-Vitamin C (ZINC PLUS VITAMIN C PO) Take 1 tablet by mouth 2 (two) times daily.   Yes [provider]  oxyCODONE (OXY IR/ROXICODONE) 5 MG immediate release tablet Take 5 mg by mouth every 4 (four) hours as needed for moderate pain or severe pain. Patient not taking: Reported on 11/30/2022 03/13/21   [provider]      I spent 54 minutes caring for this patient today, including preparing to see the patient, obtaining a medical history , reviewing a separately obtained history, performing a medically appropriate examination and/or evaluation, counseling and educating the patient/family/caregiver, ordering medications, tests, or procedures, referring and communicating with other health care professionals (not separately reported), documenting clinical information in the electronic health record, and independently interpreting results (not separately reported/billed) and communicating results to the patient/family/caregiver

## 2022-12-04 NOTE — Progress Notes (Signed)
Palliative Care Progress Note, Assessment & Plan   Patient Name: Ronald Santiago       Date: 12/04/2022 DOB: 02/14/1946  Age: 77 y.o. MRN#: [id number] Attending Physician: Leeroy Bock, MD Primary Care Physician: Mick Sell, MD Admit Date: 11/30/2022  Subjective: Patient is lying in bed in no apparent distress.  He is awake, acknowledges my presence, and is able to make his wishes known.  His wife is at bedside and requesting that we speak outside of the room.  HPI: 77 y.o. male  with past medical history of advanced primary progressive MS, predominantly bedbound, neurogenic bladder with suprapubic catheter, chronic pressure ulcer of the sacrum, chronic pain syndrome, and constipation admitted on 11/30/2022 with low oxygen, worsening cough, unable to clear sputum, and worsening shortness of breath x 1 week with a intermittent fever over 1 month.   08/2022, patient presumed to have pneumonia/bronchitis and followed course of antibiotic treatment.  H/16/2024, chest x-ray revealed right lower lobe infiltrate.  11/27/2022, oxygen reported by home PT is 87% but patient declined transfer to ED.   On admission, chest x-ray revealed large opacity in the right thorax.  CT of chest/abdomen/pelvis revealed concern for pulmonary empyema, PNA, reactive hilar lymphadenopathy, and colonic ileus/enteritis.   9/10, chest tube placed by IR and patient being followed by CCM.    PMT was consulted to discuss goals of care  Summary of counseling/coordination of care: Extensive chart review completed prior to meeting patient including labs, vital signs, imaging, progress notes, orders, and available advanced directive documents from current and previous encounters.   After reviewing the patient's chart and assessing the  patient at bedside, I spoke with patient's wife, her sister, and her husband in the [place] in regards to plan of care.  Patient's wife requested that we speak independent of patient as not to upset him.  I attempted to gauge wife's understanding of current medical situation.  She endorses patient has progressive MS and is hopeful that there can be a new therapy or medication given to help reverse it.  Discussed chronic, progressive, and irreversible nature of MS.  Reviewed acute illnesses and hospitalizations can accelerate symptoms.  She shares she was familiar that he had declined over the last year but did not know that he was end-stage MS.  Reviewed CCM Dr. Clovis Fredrickson is recommendations for continued use of chest tube but also discussion of larger picture/prognosis.  Functional status, nutritional, cognitive abilities reviewed is important indicators of patient's overall prognosis. Family endorses patient has had a significant decline over the past year and that they have not been able to get him out of his bed or his bedroom but for 2 visits to the hospital over the last 2 years.  Quality of life and human mortality discussed.  Therapeutic silence, active listening, and emotional support provided.  I attempted to elicit values and goals important to patient and family.  Patient's wife shares she would like for him to be at home and with her for as long as possible.  Discussed barriers of her ability to take care of him given bed ridden nature and her own health challenges.  She is reluctant to discuss long-term care facility placement  as his wishes are to be at home.  She is hoping for maximum home health/PT help at home once discharged.  Discussed barriers to discharge such as chest tube, IV antibiotics, and patient's current deconditioned state.  CODE STATUS reviewed.  DNR with limited interventions remains.  Wife endorses patient would never want to have his life sustained with machines.  Family  wishes to treat the treatable at this time.  We did discuss the possibility of enacting hospice benefits.  However, wife is not prepared to forego aggressive measures.  She wants to do what she can to make him well during this hospitalization so that she can return home with him as soon as possible.  Outpatient palliative services discussed.  Time for outcomes needed.  PMT will continue to follow.  Family made aware that I am off service until Tuesday.  Advised him to reach out to PMT provider with any acute palliative needs over the weekend.  Otherwise, I will touch base with him again on Tuesday.  Discussed concerns of empyema, pneumonia, poor functional status, sacral wounds, and patient's overall  Physical Exam Vitals reviewed.  Constitutional:      General: He is not in acute distress. HENT:     Head: Normocephalic.  Eyes:     Pupils: Pupils are equal, round, and reactive to light.  Cardiovascular:     Rate and Rhythm: Normal rate.  Pulmonary:     Effort: Pulmonary effort is normal.  Musculoskeletal:     Comments: Generalized weakness  Skin:    General: Skin is warm and dry.  Neurological:     Mental Status: He is alert and oriented to person, place, and time.  Psychiatric:        Mood and Affect: Mood normal. Mood is not anxious.        Behavior: Behavior normal. Behavior is not agitated.             Total Time 50 minutes   Sanae Willetts L. Bonita Quin, DNP, FNP-BC Palliative Medicine Team

## 2022-12-04 NOTE — Procedures (Signed)
Pleural Fibrinolytic Administration Procedure Note  Ronald Santiago  [id number]  [birth year]  Date:12/04/22  Time:2:58 PM   Provider Performing:Natsumi Whitsitt   Procedure: Pleural Fibrinolysis Subsequent day [phone])  Indication(s) Fibrinolysis of complicated pleural effusion  Consent Risks of the procedure as well as the alternatives and risks of each were explained to the patient and/or caregiver.  Consent for the procedure was obtained.   Anesthesia None   Time Out Verified patient identification, verified procedure, site/side was marked, verified correct patient position, special equipment/implants available, medications/allergies/relevant history reviewed, required imaging and test results available.   Sterile Technique Hand hygiene, gloves   Procedure Description Existing pleural catheter was cleaned and accessed in sterile manner.  10mg  of tPA in 30cc of saline and 5mg  of dornase in 30cc of sterile water were injected into pleural space using existing pleural catheter.  Catheter will be clamped for 1 hour and then placed back to suction (at 4 pm).   Complications/Tolerance None; patient tolerated the procedure well.  EBL None   Specimen(s) None  Raechel Chute, MD [facility] 12/04/2022 2:58 PM

## 2022-12-04 NOTE — Progress Notes (Addendum)
Speech Language Pathology Treatment: Dysphagia  Patient Details Name: Ronald Santiago MRN: [id number] DOB: 03-Aug-1945 Today's Date: 12/04/2022 Time: 2952-8413 SLP Time Calculation (min) (ACUTE ONLY): 30 min  Assessment / Plan / Recommendation Clinical Impression  Pt seen for ongoing assessment of swallowing and toleration of his dysphagia diet; education re: general swallowing and impact from illness including MS, diet consistency rec'd, and general aspiration precautions including recommendation for safer swallowing of Pills by use of a PUREE -- for all Pill swallowing. Pt has a Baseline of MS and requires full support w/ all feeding.  Wife present has stated pt's overall physical functioning has declined in last ~4-5 months. Noted a congested cough x2 at Baseline moving about in bed -- he required full positioning support d/t weakness. Pt has baseline of Multiple Sclerosis(MS). Both pt and Wife endorse lengthy illness since ~April-May, more sedentary in the bed, and need for increased support w/ ADLs including feeding. Wife has been chopping foods "very small" d/t pt eating w/out his Dentures at home.   Per Pulmonary/chart notes, CT of chest/abdomen/pelvis revealed concern for pulmonary empyema, PNA, reactive hilar lymphadenopathy, and colonic ileus/enteritis. He has been diagnosed with empyema. Chest tube has been placed by IR, and the patient was initiated on intrapleural fibrinolytic therapy.     Discussed w/ pt/Wife the current diet consistency including use of Cut meats w/ moistened, cooked foods for ease of mastication, cohesion, and overall intake. Pt, Wife and NSG denied any overt s/s of aspiration w/ current oral intake at recent meals. Pt has weaned from  O2 needs since eval.  Talked w/ pt, Wife about reducing risks for aspiration by following general aspiration precautions in setting of weakness/MS to include Small sips Slowly, No straws if increased coughing noted, and Sit fully Upright w/  Head forward all oral intake. Recommended use of Dysphagia drink Cup for improved bolus control -- gave Handout for ordering if desired. Discussed reducing distractions including Talking at meals and clearing mouth fully b/t Small bites/sips. Also discussed food consistencies and options, preparation, and use of condiments to soften foods also. Encouragement given for ALL Pills to be swallowed using a Puree for safer clearing of the oropharynx.   Handouts given to Wife on aspiration precautions and Pill swallowing option; diet consistency and preparation suggestions and options; Dysphagia drink Cup.     Recommend continue a Dysphagia level 3(Chopped/minced meats) w/ Thin liquids- monitor straw use; aspiration precautions; Pills in a Puree for safer swallowing. Out of bed for meals; positioned w/ head forward/upright if eating/drinking in bed. Feeding Support - engage pt holding the Cup as able to for improved safety.  Education completed w/ pt, Wife. No further skilled ST services indicated at this time. Education completed. Noted Palliative Care f/u w/ pt/Wife.  Precautions posted in room, chart. Wife, pt and NSG agreed.     HPI HPI: Pt is a 77 y.o. 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 Patient had been previously evaluated/treated by his PCP for pneumonia with CXR showing right lower lobe infiltrate.  In the ED he was found hypoxic with sats below 88% and his WBC was 16.8. Imaging shows a possible empyema versus malignancy.  CT Chest/Abdomen/Pelvis 11/30/22  IMPRESSION:  1. Large walled-off fluid collection in the posterior right  hemithorax,  most likely within the pleural space. Findings are  worrisome for empyema.  2. Patchy airspace  opacities in the left lower lobe worrisome for  pneumonia.  3. Patchy ground-glass opacities in the right upper lobe are likely  infectious/inflammatory.  4. Right hilar lymphadenopathy, likely reactive.  5. Large amount of stool in the rectum with diffuse gaseous  distension of the colon concerning for colonic ileus. No bowel  obstruction.  6. Scattered air-fluid levels throughout small bowel loops are  nonspecific, but can be seen in the setting of enteritis or ileus.  Pt is tentatively scheduled 12/02/22 for an image-guided right chest tube placement per Vascular chart note.      SLP Plan  All goals met      Recommendations for follow up therapy are one component of a multi-disciplinary discharge planning process, led by the attending physician.  Recommendations may be updated based on patient status, additional functional criteria and insurance authorization.    Recommendations  Diet recommendations: Dysphagia 3 (mechanical soft);Thin liquid (moistened foods) Liquids provided via: Cup;Straw (monitor straw use esp. when lying in bed) Medication Administration: Whole meds with puree Supervision: Staff to assist with self feeding;Full supervision/cueing for compensatory strategies (pt unable to feed self d/t weakness) Compensations: Minimize environmental distractions;Slow rate;Small sips/bites;Lingual sweep for clearance of pocketing;Follow solids with liquid;Multiple dry swallows after each bite/sip Postural Changes and/or Swallow Maneuvers: Out of bed for meals;Seated upright 90 degrees;Upright 30-60 min after meal (REFLUX precs.)                 (Dietician f/u; Palliative care following) Oral care BID;Oral care before and after PO;Staff/trained caregiver to provide oral care   Frequent or constant Supervision/Assistance (weakness; MS) Dysphagia, unspecified (R13.10) (Esophageal phase Dysmotility; feeding dependency)     All goals met     [facility]  12/04/2022, 4:57 PM

## 2022-12-05 ENCOUNTER — Inpatient Hospital Stay: Payer: Medicare Other

## 2022-12-05 DIAGNOSIS — J189 Pneumonia, unspecified organism: Secondary | ICD-10-CM | POA: Diagnosis not present

## 2022-12-05 DIAGNOSIS — J869 Pyothorax without fistula: Secondary | ICD-10-CM | POA: Diagnosis not present

## 2022-12-05 DIAGNOSIS — R059 Cough, unspecified: Secondary | ICD-10-CM

## 2022-12-05 DIAGNOSIS — A419 Sepsis, unspecified organism: Secondary | ICD-10-CM | POA: Diagnosis not present

## 2022-12-05 DIAGNOSIS — J9601 Acute respiratory failure with hypoxia: Secondary | ICD-10-CM | POA: Diagnosis not present

## 2022-12-05 LAB — BASIC METABOLIC PANEL
Anion gap: 7 (ref 5–15)
BUN: 9 mg/dL (ref 8–23)
CO2: 28 mmol/L (ref 22–32)
Calcium: 8 mg/dL — ABNORMAL LOW (ref 8.9–10.3)
Chloride: 97 mmol/L — ABNORMAL LOW (ref 98–111)
Creatinine, Ser: 0.44 mg/dL — ABNORMAL LOW (ref 0.61–1.24)
GFR, Estimated: >60 mL/min (ref >60)
Glucose, Bld: 105 mg/dL — ABNORMAL HIGH (ref 70–99)
Potassium: 3.8 mmol/L (ref 3.5–5.1)
Sodium: 132 mmol/L — ABNORMAL LOW (ref 135–145)

## 2022-12-05 LAB — CULTURE, BLOOD (ROUTINE X 2): Culture: NO GROWTH

## 2022-12-05 LAB — CBC
HCT: 29.6 % — ABNORMAL LOW (ref 39.0–52.0)
Hemoglobin: 9.6 g/dL — ABNORMAL LOW (ref 13.0–17.0)
MCH: 29.4 pg (ref 26.0–34.0)
MCHC: 32.4 g/dL (ref 30.0–36.0)
MCV: 90.8 fL (ref 80.0–100.0)
Platelets: 367 10*3/uL (ref 150–400)
RBC: 3.26 MIL/uL — ABNORMAL LOW (ref 4.22–5.81)
RDW: 14.5 % (ref 11.5–15.5)
WBC: 6.9 10*3/uL (ref 4.0–10.5)
nRBC: 0 % (ref 0.0–0.2)

## 2022-12-05 LAB — PROCALCITONIN: Procalcitonin: 0.17 ng/mL

## 2022-12-05 LAB — SEDIMENTATION RATE: Sed Rate: 128 mm/h — ABNORMAL HIGH (ref 0–20)

## 2022-12-05 LAB — C-REACTIVE PROTEIN: CRP: 12.6 mg/dL — ABNORMAL HIGH (ref <1.0)

## 2022-12-05 MED ORDER — SODIUM CHLORIDE (PF) 0.9 % IJ SOLN: 10.0000 mg | Freq: Once | INTRAMUSCULAR | Status: DC

## 2022-12-05 MED ORDER — STERILE WATER FOR INJECTION IJ SOLN
5.0000 mg | Freq: Once | RESPIRATORY_TRACT | Status: AC
  Administered 2022-12-05: 5 mg via INTRAPLEURAL
  Filled 2022-12-05: qty 5

## 2022-12-05 MED ORDER — IOHEXOL 300 MG/ML  SOLN
75.0000 mL | Freq: Once | INTRAMUSCULAR | Status: AC | PRN
  Administered 2022-12-05: 75 mL via INTRAVENOUS

## 2022-12-05 MED ORDER — STERILE WATER FOR INJECTION IJ SOLN: 5.0000 mg | Freq: Once | RESPIRATORY_TRACT | Status: DC

## 2022-12-05 MED ORDER — SODIUM CHLORIDE (PF) 0.9 % IJ SOLN
10.0000 mg | Freq: Once | INTRAMUSCULAR | Status: AC
  Administered 2022-12-05: 10 mg via INTRAPLEURAL
  Filled 2022-12-05: qty 10

## 2022-12-05 NOTE — Procedures (Signed)
Pleural Fibrinolytic Administration Procedure Note  Bettie Quebodeaux  [id number]  November 25, 1945  Date:12/05/22  Time:2:41 PM   Provider Performing:Kaiea Esselman   Procedure: Pleural Fibrinolysis Subsequent day [phone])  Indication(s) Fibrinolysis of complicated pleural effusion  Consent Risks of the procedure as well as the alternatives and risks of each were explained to the patient and/or caregiver.  Consent for the procedure was obtained.   Anesthesia None   Time Out Verified patient identification, verified procedure, site/side was marked, verified correct patient position, special equipment/implants available, medications/allergies/relevant history reviewed, required imaging and test results available.   Sterile Technique Hand hygiene, gloves   Procedure Description Existing pleural catheter was cleaned and accessed in sterile manner.  10mg  of tPA in 30cc of saline and 5mg  of dornase in 30cc of sterile water were injected into pleural space using existing pleural catheter.  Catheter will be clamped for 1 hour and then placed back to suction.   Complications/Tolerance None; patient tolerated the procedure well.  EBL None   Specimen(s) None  Raechel Chute, MD [facility] 12/05/2022 2:41 PM

## 2022-12-05 NOTE — Progress Notes (Signed)
PROGRESS NOTE  Ronald Santiago   XBM:[id number] DOB: [birth year]  DOA: 11/30/2022 Date of Service: 12/05/22 PCP: Mick Sell, MD  Brief Narrative / Hospital Course:  Ronald Santiago is a 77 y.o. 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 Few courses of abx since 08/2022 for presumed PNA/bronchitis.CXR 11/06/22 (+)RLL infiltrate. 11/27/22 SpO2 87% w/ home PT but pt refused to go to ED>  09/09: to ED. SpO2 80s improved on 3L [place] to 96%. WBC 16.8, sodium 128, Chest x-ray was obtained that demonstrated a large ovoid opacity in the right thorax. CT chest/abd/pelv (+) concern for pulmonary empyema, pneumonia, reactive hilar lymphadenopathy, colonic ileus/enteritis. Admitted to hospitalist service.  09/10: IR plan chest tube placement tomorrow, consult to pulmonary (Dr Belia Heman alerted and will see pt).  9/11: Chest tube placed without complication. Patient doing well post-procedure. Remains on 2L [place] 9/12: patient was febrile overnight tmax 100.7. blood cultures remain negative. Culture from pleural sample NGTD. Fibrinolytic therapy.  9/13: patient feeling better today. Afebrile >24 hours. Culture positive for streptococcus constellatus. Fibrinolytic therapy with follow up CT chest today. Palliative meeting with patient and wife today.  9/14: culture sensitivities still pending. Chest CT. Continuing with saline pleural flushes.    Consultants:  Interventional radiology Pulmonology    Procedures: Chest tube placement 9/11    ASSESSMENT & PLAN:  Principal Problem:   Acute hypoxic respiratory failure (HCC) Active Problems:   Lung mass   Hyponatremia   Leukocytosis   Stage IV decubitus  ulcer (HCC)   Multiple sclerosis, primary progressive (HCC)   Flaccid neuropathic bladder, not elsewhere classified   Constipation   Acute hypoxic respiratory failure  lung mass/empyema, pneumonia  Continue supplemental oxygen to maintain oxygen saturation above 88%. Wean as tolerated but will likely require discharge with supplemental oxygen. Has been stable on 2-3L several days. Denies respiratory distress Chest PT Treat underlying causes as below    Lung abscess  empyema Telemetry monitoring IR consult Chest tube placed 9/11. Labs pending.  Pulmonary consult - chest tube management, fibrinolytic therapy completed 3 days. Now on saline flushes. Chest CT today Continue ceftriaxone and azithromycin. Culture sensitivities pending  Anemia of chronic disease- hgb steadily dropping. 11.5>10.4>9.0>9.6. no sign of acute bleeding. No clear blood in pleural drainage Transfusion threshold 7 - monitor CBC   Hyponatremia- stable/improving. Na+ 131>130>132. PO intake has diminished over the past 1 week, so likely dehydration related, ddx would include SIADH given pulmonary pathology   Monitor sodium   Constipation, concern for ileus- as seen on chest imaging. Had multiple small BMs since admission  Continue home regimen, additional bowel regimen added Adding Enema prn   Flaccid neuropathic bladder, not elsewhere classified Continue suprapubic catheter Also on oxybutynin for intermittent bladder spasm which causes urethral urine leakage    Multiple sclerosis, primary progressive (HCC) History of primary progressive MS, currently bedbound. Continue home oxycodone, baclofen and gabapentin At home takes "4 ibuprofen in the morning and 4 in the evening" and has for several years. Discussed the high risk of side effects of this regimen with patient and wife and recommended tylenol and trial of naproxen PRN Mobilize in bed / OOB to chair as able  Has been made DNI/DNR.  Palliative consulted.  Meeting to discuss GOC 9/13   Stage IV decubitus ulcer (HCC) Per patient's wife, patient has chronic ulcerations and HH visits to help manage.  routine care WOC consult    Concern for malnutrition/underweight based on BMI: Body mass index is 23.06 kg/m.   Hearing decrease- likely cerumen impaction - examine with otoscope - debrox drops   DVT prophylaxis: lovenox  IV fluids: LR continuous IV fluids  Nutrition: regular diet Central lines / invasive devices: chronic suprapubic catheter, R chest tube   Code Status: DNR ACP documentation reviewed: none on file in [facility]   TOC needs: none at this time Barriers to dispo / significant pending items: clinical improvement, address lung mass, await cultures, expect will be here few days  Subjective / Brief ROS:  Patient reports complaints about moving onto the CT scanner and back being uncomfortable. Also endorses decreased hearing. No other concerns at this time. Breathing is comfortable.   Family Communication: wife is at bedside on rounds   Objective Findings:  Vitals:   12/03/22 2254 12/04/22 0811 12/04/22 1603 12/05/22 0059  BP: 96/62 99/61 110/63 95/60  Pulse: 79 83 87 86  Resp: 16 17 16 18   Temp: 97.6 F (36.4 C) 97.6 F (36.4 C) 98.6 F (37 C) 99.2 F (37.3 C)  TempSrc:      SpO2: 98% 96% 97% 95%  Weight:      Height:        Intake/Output Summary (Last 24 hours) at 12/05/2022 0730 Last data filed at 12/05/2022 0700 Gross per 24 hour  Intake 539.5 ml  Output 1950 ml  Net -1410.5 ml   Filed Weights   11/30/22 1910  Weight: 77.1 kg    Examination:  Physical Exam Constitutional:      General: He is not in acute distress. Cardiovascular:     Rate and Rhythm: Normal rate and regular rhythm.  Pulmonary:     Breath sounds: Examination of the right-middle field reveals decreased breath sounds. Examination of the right-lower field reveals decreased breath sounds. Decreased breath sounds present.     Comments:  Chest tube in place right side. Green opaque drainage Abdominal:     Palpations: Abdomen is soft.     Tenderness: There is no guarding or rebound.  Musculoskeletal:     Right lower leg: No edema.     Left lower leg: No edema.  Skin:    General: Skin is warm and dry.  Neurological:     Mental Status: He is alert and oriented to person, place, and time.  Psychiatric:        Mood and Affect: Mood normal.        Behavior: Behavior normal.     Scheduled Medications:   baclofen  20 mg Oral BID WC   baclofen  40 mg Oral QHS   carbamide peroxide  5 drop Both EARS BID   enoxaparin (LOVENOX) injection  40 mg Subcutaneous Q24H   gabapentin  1,200 mg Oral TID   loratadine  10 mg Oral Daily   magnesium oxide  400 mg Oral Daily   naloxegol oxalate  25 mg Oral Daily   naproxen  500 mg Oral BID WC   oxybutynin  5 mg Oral TID   oxyCODONE  10 mg Oral Q8H   polyethylene glycol  17 g Oral Daily   sodium chloride flush  20 mL Intrapleural Q6H   sodium chloride flush  3 mL Intravenous Q12H    Continuous Infusions:  sodium chloride  10 mL/hr at 12/04/22 0503   ampicillin-sulbactam (UNASYN) IV 3 g (12/05/22 0535)    PRN Medications:  acetaminophen **OR** acetaminophen, chlorpheniramine-HYDROcodone, clonazePAM, ondansetron **OR** ondansetron (ZOFRAN) IV, sorbitol, milk of mag, mineral oil, glycerin (SMOG) enema  Antimicrobials from admission:  Anti-infectives (From admission, onward)    Start     Dose/Rate Route Frequency Ordered Stop   12/01/22 0015  Ampicillin-Sulbactam (UNASYN) 3 g in sodium chloride 0.9 % 100 mL IVPB        3 g 200 mL/hr over 30 Minutes Intravenous Every 6 hours 12/01/22 0010     11/30/22 2000  cefTRIAXone (ROCEPHIN) 1 g in sodium chloride 0.9 % 100 mL IVPB  Status:  Discontinued        1 g 200 mL/hr over 30 Minutes Intravenous Every 24 hours 11/30/22 1948 11/30/22 2335   11/30/22 2000  azithromycin (ZITHROMAX) 500 mg in sodium chloride 0.9 % 250 mL IVPB        500  mg 250 mL/hr over 60 Minutes Intravenous Every 24 hours 11/30/22 1948 12/04/22 2122   11/30/22 1945  levofloxacin (LEVAQUIN) IVPB 750 mg  Status:  Discontinued        750 mg 100 mL/hr over 90 Minutes Intravenous  Once 11/30/22 1935 11/30/22 1948       Data Reviewed:  I have personally reviewed the following...  CBC: Recent Labs  Lab 12/01/22 0530 12/02/22 0751 12/03/22 0450 12/04/22 0449 12/05/22 0510  WBC 12.2* 13.2* 14.6* 9.8 6.9  NEUTROABS 9.8*  --   --   --   --   HGB 9.9* 11.5* 10.4* 9.0* 9.6*  HCT 30.5* 36.4* 31.9* 27.7* 29.6*  MCV 91.3 91.7 90.9 89.9 90.8  PLT 436* 428* 416* 374 367   Basic Metabolic Panel: Recent Labs  Lab 12/01/22 0530 12/01/22 1311 12/02/22 0751 12/03/22 0450 12/04/22 0449 12/05/22 0510  NA 129*  --  131* 130* 132* 132*  K 3.6 3.7 4.0 3.9 3.8 3.8  CL 99  --  96* 95* 96* 97*  CO2 24  --  28 27 29 28   GLUCOSE 100*  --  105* 122* 106* 105*  BUN 11  --  9 10 11 9   CREATININE 0.35*  --  0.44* 0.36* 0.34* 0.44*  CALCIUM 7.8*  --  8.1* 8.0* 7.7* 8.0*      LOS: 4 days    Time spent: 50 min    Ebin Palazzi Jannette Fogo, DO [facility] 12/05/2022, 7:30 AM   If 7PM-7AM, please contact night coverage [email or web address]

## 2022-12-05 NOTE — Plan of Care (Signed)

## 2022-12-05 NOTE — Progress Notes (Signed)
NAME:  Ronald Santiago, MRN:  [id number], DOB:  08/20/1945, LOS: 4 ADMISSION DATE:  11/30/2022, CHIEF COMPLAINT:  Empyema   History of Present Illness:  Patient is a 77 year old male presenting to the hospital with shortness of breath and hypoxemia.  He was diagnosed with empyema and admitted to medicine.  Chest tube has been placed and the patient was initiated on intrapleural fibrinolytic therapy.  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  Patient has had multiple recent pneumonias for which she required antibiotic therapy.  He presented to the ED with increased shortness of breath, cough, and fever.  He was noted to have leukocytosis with a chest x-ray concerning for an opacity in the right lower lung field.  IR guided 10 French chest tube was placed on 9/10 and the patient has received 3 doses of fibrinolytic therapy on 9/11, 9/12, and 9/13.  Today, he feels unchanged.  He continues to have pain in his chest and continues to feel weak.  He is awake and alert. His wife is at the bedside.  Pertinent  Medical History  Multiple sclerosis Suprapubic catheter Pressure ulcer of the sacrum chronic pain Pneumonia   Objective   Blood pressure 105/63, pulse 78, temperature 97.9 F (36.6 C), resp. rate 16, height 6' (1.829 m), weight 77.1 kg, SpO2 97%.        Intake/Output Summary (Last 24 hours) at 12/05/2022 1006 Last data filed at 12/05/2022 0700 Gross per 24 hour  Intake 299.5 ml  Output 1950 ml  Net -1650.5 ml   Filed Weights   11/30/22 1910  Weight: 77.1 kg    Examination: Physical Exam Constitutional:      General: He is not in acute distress.    Appearance: He is ill-appearing.  HENT:     Mouth/Throat:     Mouth: Mucous membranes are moist.  Cardiovascular:     Pulses: Normal pulses.     Heart sounds: Normal heart sounds.  Pulmonary:     Effort: Pulmonary  effort is normal.     Breath sounds: Normal breath sounds. No wheezing or rales.     Comments: On anterior auscultation.  On examination of the right chest wall, a 10 French chest tube is noted in place. Musculoskeletal:     Right lower leg: No edema.     Left lower leg: No edema.  Neurological:     General: No focal deficit present.     Mental Status: He is alert and oriented to person, place, and time. Mental status is at baseline.      Assessment & Plan:   #Acute Hypoxic Respiratory Failure #Right Sided Empyema  Presented with hypoxic respiratory failure found to have a right-sided concerning for empyema.  He is status postplacement of a chest tube under CT guidance to the right pleural collection with the drainage of pus.  Culture has been sent and Gram stain is positive for streptococcus contellatus.  He is on broad-spectrum antibiotics with improvement in his fever trend and white count.  He has received 3 doses of fibrinolytic therapy (tPA/DNase) and continues on intra-pleural saline flushes every 6 hours. CXR this AM shows improved aeration of the right lung base on my review. Will obtain a CT scan of the chest with contrast to re-evaluate the pleural space. ESR/CRP are elevated, procalcitonin is within normal.  -NS flushes intrapleurally every 6 hours (15 mL towards the patient, 5 mL towards the  [place]) -Continue broad-spectrum antibiotics -Our team will continue to follow -CT chest with contrast  Raechel Chute, MD [facility] 12/05/2022 10:06 AM    Labs   CBC: Recent Labs  Lab 12/01/22 0530 12/02/22 0751 12/03/22 0450 12/04/22 0449 12/05/22 0510  WBC 12.2* 13.2* 14.6* 9.8 6.9  NEUTROABS 9.8*  --   --   --   --   HGB 9.9* 11.5* 10.4* 9.0* 9.6*  HCT 30.5* 36.4* 31.9* 27.7* 29.6*  MCV 91.3 91.7 90.9 89.9 90.8  PLT 436* 428* 416* 374 367    Basic Metabolic Panel: Recent Labs  Lab 12/01/22 0530 12/01/22 1311 12/02/22 0751  12/03/22 0450 12/04/22 0449 12/05/22 0510  NA 129*  --  131* 130* 132* 132*  K 3.6 3.7 4.0 3.9 3.8 3.8  CL 99  --  96* 95* 96* 97*  CO2 24  --  28 27 29 28   GLUCOSE 100*  --  105* 122* 106* 105*  BUN 11  --  9 10 11 9   CREATININE 0.35*  --  0.44* 0.36* 0.34* 0.44*  CALCIUM 7.8*  --  8.1* 8.0* 7.7* 8.0*   GFR: Estimated Creatinine Clearance: 87 mL/min (A) (by C-G formula based on SCr of 0.44 mg/dL (L)). Recent Labs  Lab 11/30/22 2003 12/01/22 0530 12/02/22 0751 12/03/22 0450 12/04/22 0449 12/05/22 0510  PROCALCITON  --   --   --   --   --  0.17  WBC 16.8*   < > 13.2* 14.6* 9.8 6.9  LATICACIDVEN 1.1  --   --   --   --   --    < > = values in this interval not displayed.    Liver Function Tests: Recent Labs  Lab 12/03/22 0450  AST 27  ALT 29  ALKPHOS 58  BILITOT 0.5  PROT 6.9  ALBUMIN 2.1*   No results for input(s): "LIPASE", "AMYLASE" in the last 168 hours. No results for input(s): "AMMONIA" in the last 168 hours.  ABG No results found for: "PHART", "PCO2ART", "PO2ART", "HCO3", "TCO2", "ACIDBASEDEF", "O2SAT"   Coagulation Profile: Recent Labs  Lab 12/02/22 0751  INR 1.4*    Cardiac Enzymes: No results for input(s): "CKTOTAL", "CKMB", "CKMBINDEX", "TROPONINI" in the last 168 hours.  HbA1C: No results found for: "HGBA1C"  CBG: No results for input(s): "GLUCAP" in the last 168 hours.   Past Medical History:  He,  has a past medical history of Chronic kidney disease and Multiple sclerosis, primary progressive (HCC).   Surgical History:   Past Surgical History:  Procedure Laterality Date   BACK SURGERY     CHOLECYSTECTOMY     EYE SURGERY       Social History:   reports that he has never smoked. He has never been exposed to tobacco smoke. He has never used smokeless tobacco. He reports that he does not drink alcohol and does not use drugs.   Family History:  His family history is not on file.   Allergies Allergies  Allergen Reactions    Cephalexin Anaphylaxis and Hives    Pt tolerated several doses of cefepime during hospitalization in 03/2021     Home Medications  Prior to Admission medications   Medication Sig Start Date End Date Taking? Authorizing Provider  baclofen (LIORESAL) 20 MG tablet Take 20-40 mg by mouth 3 (three) times daily. (20 mg in morning, 20 mg at noon, and 40 mg at bedtime) 05/21/20  Yes [provider]  cetirizine (ZYRTEC) 10 MG  tablet Take 10 mg by mouth daily.   Yes [provider]  Cholecalciferol (D3-1000 PO) Take 1,000 Units by mouth daily.   Yes [provider]  clonazePAM (KLONOPIN) 1 MG tablet Take 1 mg by mouth 2 (two) times daily as needed. 08/31/22  Yes [provider]  docusate sodium (COLACE) 100 MG capsule Take 400 mg by mouth at bedtime.   Yes [provider]  gabapentin (NEURONTIN) 600 MG tablet Take 1,200 mg by mouth 3 (three) times daily. 02/14/19  Yes [provider]  ibuprofen (ADVIL) 200 MG tablet Take 800 mg by mouth in the morning and at bedtime.   Yes [provider]  magnesium oxide (MAG-OX) 400 MG tablet Take 1 tablet by mouth daily.   Yes [provider]  MOVANTIK 25 MG TABS tablet Take 25 mg by mouth daily. 05/21/20  Yes [provider]  Multiple Vitamin (MULTIVITAMIN WITH MINERALS) TABS tablet Take 1 tablet by mouth daily.   Yes [provider]  nystatin (MYCOSTATIN/NYSTOP) powder SMARTSIG:1 Application Topical 2-3 Times Daily 06/13/20  Yes [provider]  oxybutynin (DITROPAN-XL) 10 MG 24 hr tablet Take 1 tablet (10 mg total) by mouth 3 (three) times daily. 07/13/22 07/10/23 Yes MacDiarmid, Lorin Picket, MD  oxyCODONE (OXY IR/ROXICODONE) 5 MG immediate release tablet Take 10 mg by mouth every 8 (eight) hours. 11/26/22 05/25/23 Yes [provider]  Probiotic Product (PROBIOTIC-10 PO) Take by mouth.   Yes [provider]  Pumpkin Seed 500 MG CAPS Take 1,000 mg by mouth daily.   Yes  [provider]  Zinc Oxide-Vitamin C (ZINC PLUS VITAMIN C PO) Take 1 tablet by mouth 2 (two) times daily.   Yes [provider]  oxyCODONE (OXY IR/ROXICODONE) 5 MG immediate release tablet Take 5 mg by mouth every 4 (four) hours as needed for moderate pain or severe pain. Patient not taking: Reported on 11/30/2022 03/13/21   [provider]     I spent 45 minutes caring for this patient today, including preparing to see the patient, obtaining a medical history , reviewing a separately obtained history, performing a medically appropriate examination and/or evaluation, counseling and educating the patient/family/caregiver, ordering medications, tests, or procedures, and documenting clinical information in the electronic health record

## 2022-12-06 ENCOUNTER — Inpatient Hospital Stay: Payer: Medicare Other

## 2022-12-06 DIAGNOSIS — R059 Cough, unspecified: Secondary | ICD-10-CM | POA: Diagnosis not present

## 2022-12-06 DIAGNOSIS — J9601 Acute respiratory failure with hypoxia: Secondary | ICD-10-CM | POA: Diagnosis not present

## 2022-12-06 DIAGNOSIS — J189 Pneumonia, unspecified organism: Secondary | ICD-10-CM | POA: Diagnosis not present

## 2022-12-06 DIAGNOSIS — A419 Sepsis, unspecified organism: Secondary | ICD-10-CM | POA: Diagnosis not present

## 2022-12-06 LAB — CULTURE, BLOOD (ROUTINE X 2): Culture: NO GROWTH

## 2022-12-06 LAB — BASIC METABOLIC PANEL
Anion gap: 8 (ref 5–15)
BUN: 7 mg/dL — ABNORMAL LOW (ref 8–23)
CO2: 27 mmol/L (ref 22–32)
Calcium: 8.4 mg/dL — ABNORMAL LOW (ref 8.9–10.3)
Chloride: 98 mmol/L (ref 98–111)
Creatinine, Ser: 0.35 mg/dL — ABNORMAL LOW (ref 0.61–1.24)
GFR, Estimated: >60 mL/min (ref >60)
Glucose, Bld: 99 mg/dL (ref 70–99)
Potassium: 4 mmol/L (ref 3.5–5.1)
Sodium: 133 mmol/L — ABNORMAL LOW (ref 135–145)

## 2022-12-06 LAB — CBC
HCT: 31.6 % — ABNORMAL LOW (ref 39.0–52.0)
Hemoglobin: 9.9 g/dL — ABNORMAL LOW (ref 13.0–17.0)
MCH: 28.8 pg (ref 26.0–34.0)
MCHC: 31.3 g/dL (ref 30.0–36.0)
MCV: 91.9 fL (ref 80.0–100.0)
Platelets: 432 10*3/uL — ABNORMAL HIGH (ref 150–400)
RBC: 3.44 MIL/uL — ABNORMAL LOW (ref 4.22–5.81)
RDW: 14.5 % (ref 11.5–15.5)
WBC: 4.3 10*3/uL (ref 4.0–10.5)
nRBC: 0 % (ref 0.0–0.2)

## 2022-12-06 LAB — C-REACTIVE PROTEIN: CRP: 10.5 mg/dL — ABNORMAL HIGH (ref <1.0)

## 2022-12-06 LAB — SEDIMENTATION RATE: Sed Rate: 106 mm/h — ABNORMAL HIGH (ref 0–16)

## 2022-12-06 NOTE — Progress Notes (Signed)
PROGRESS NOTE  Ronald Santiago   EPP:[id number] DOB: [birth year]  DOA: 11/30/2022 Date of Service: 12/06/22 PCP: Mick Sell, MD  Brief Narrative / Hospital Course:  Ronald Santiago is a 77 y.o. 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 Few courses of abx since 08/2022 for presumed PNA/bronchitis.CXR 11/06/22 (+)RLL infiltrate. 11/27/22 SpO2 87% w/ home PT but pt refused to go to ED>  09/09: to ED. SpO2 80s improved on 3L [place] to 96%. WBC 16.8, sodium 128, Chest x-ray was obtained that demonstrated a large ovoid opacity in the right thorax. CT chest/abd/pelv (+) concern for pulmonary empyema, pneumonia, reactive hilar lymphadenopathy, colonic ileus/enteritis. Admitted to hospitalist service.  09/10: IR plan chest tube placement tomorrow, consult to pulmonary (Dr Belia Heman alerted and will see pt).  9/11: Chest tube placed without complication. Patient doing well post-procedure. Remains on 2L [place] 9/12: patient was febrile overnight tmax 100.7. blood cultures remain negative. Culture from pleural sample NGTD. Fibrinolytic therapy.  9/13: patient feeling better today. Afebrile >24 hours. Culture positive for streptococcus constellatus. Fibrinolytic therapy with follow up CT chest today. Palliative meeting with patient and wife today.  9/14-9/15: culture sensitivities still pending. Chest CT and xras reveal improvement in overall status of empyema. Continuing with saline pleural flushes. Pulmonology continues to follow. Appreciate palliative involvement in ongoing discussions for disposition and GOC.    Consultants:  Interventional radiology Pulmonology    Procedures: Chest tube placement 9/11     ASSESSMENT & PLAN:  Principal Problem:   Acute hypoxic respiratory failure (HCC) Active Problems:   Lung mass   Hyponatremia   Leukocytosis   Stage IV decubitus ulcer (HCC)   Multiple sclerosis, primary progressive (HCC)   Flaccid neuropathic bladder, not elsewhere classified   Constipation   Acute hypoxic respiratory failure  lung mass/empyema, pneumonia  Continue supplemental oxygen to maintain oxygen saturation above 88%. Wean as tolerated but will likely require discharge with supplemental oxygen. Has been stable on 2-3L several days. Denies respiratory distress Chest PT Treat underlying causes as below Flutter valve, incentive spirometer    Lung abscess  empyema IR consult Chest tube placed 9/11. Labs pending.  Pulmonary consult - chest tube management, fibrinolytic therapy completed 3 days. Now on saline flushes. Chest CT today Continue ceftriaxone and azithromycin. Culture sensitivities pending  Anemia of chronic disease- hgb steady. 11.5>10.4>9.0>9.6>9.9. no sign of acute bleeding. No clear blood in pleural drainage Transfusion threshold 7 - monitor CBC   Hyponatremia- stable/improving. Na+ 131>>>133. PO intake has diminished prior to arrival so likely dehydration related, ddx would include SIADH given pulmonary pathology   Monitor sodium   Constipation, concern for ileus- as seen on chest imaging. Had multiple small BMs since admission. States that for him, going 4 days in between BMs is normal. Continue home regimen, additional bowel regimen added Adding Enema prn   Flaccid neuropathic bladder, intermittent spasm Continue suprapubic catheter Also on oxybutynin for intermittent bladder spasm which causes urethral urine leakage    Multiple sclerosis, primary progressive (HCC) History of primary progressive MS, currently bedbound. Continue home oxycodone, baclofen and gabapentin At home takes "4 ibuprofen in the morning and 4 in the evening" and has for several  years. Discussed the high risk of side effects  of this regimen with patient and wife and recommended tylenol and trial of naproxen PRN Mobilize in bed / OOB to chair as able  Has been made DNI/DNR.  Palliative consulted.  Meeting to discuss GOC 9/13   Stage IV decubitus ulcer (HCC) Per patient's wife, patient has chronic ulcerations and HH visits to help manage.  routine care WOC consult    Concern for malnutrition/underweight based on BMI: Body mass index is 23.06 kg/m.   Hearing decrease- likely cerumen impaction - examine with otoscope - debrox drops   DVT prophylaxis: lovenox  IV fluids: LR continuous IV fluids  Nutrition: regular diet Central lines / invasive devices: chronic suprapubic catheter, R chest tube   Code Status: DNR ACP documentation reviewed: none on file in [facility]   TOC needs: none at this time Barriers to dispo / significant pending items: clinical improvement, address lung mass, await cultures, expect will be here few days  Subjective / Brief ROS:  Patient reports doing alright today. They are looking forward to getting a plan for getting discharged from the hospital  Family Communication: wife is at bedside on rounds   Objective Findings:  Vitals:   12/04/22 1603 12/05/22 0059 12/05/22 0902 12/05/22 1720  BP: 110/63 95/60 105/63 110/67  Pulse: 87 86 78 78  Resp: 16 18 16 16   Temp: 98.6 F (37 C) 99.2 F (37.3 C) 97.9 F (36.6 C) 98.8 F (37.1 C)  TempSrc:      SpO2: 97% 95% 97% 98%  Weight:      Height:        Intake/Output Summary (Last 24 hours) at 12/06/2022 0741 Last data filed at 12/06/2022 0430 Gross per 24 hour  Intake 20 ml  Output 2050 ml  Net -2030 ml   Filed Weights   11/30/22 1910  Weight: 77.1 kg    Examination:  Physical Exam Constitutional:      General: He is not in acute distress. Cardiovascular:     Rate and Rhythm: Normal rate and regular rhythm.  Pulmonary:     Breath sounds: Examination of the right-middle  field reveals decreased breath sounds. Examination of the right-lower field reveals decreased breath sounds. Decreased breath sounds present.     Comments: Chest tube in place right side. Green opaque drainage Abdominal:     Palpations: Abdomen is soft.     Tenderness: There is no guarding or rebound.  Musculoskeletal:     Right lower leg: No edema.     Left lower leg: No edema.  Skin:    General: Skin is warm and dry.  Neurological:     Mental Status: He is alert and oriented to person, place, and time.  Psychiatric:        Mood and Affect: Mood normal.        Behavior: Behavior normal.     Scheduled Medications:   baclofen  20 mg Oral BID WC   baclofen  40 mg Oral QHS   carbamide peroxide  5 drop Both EARS BID   enoxaparin (LOVENOX) injection  40 mg Subcutaneous Q24H   gabapentin  1,200 mg Oral TID   loratadine  10 mg Oral Daily   magnesium oxide  400 mg Oral Daily   naloxegol oxalate  25 mg Oral Daily   naproxen  500 mg Oral BID WC   oxybutynin  5 mg Oral TID   oxyCODONE  10 mg Oral Q8H   polyethylene glycol  17 g Oral Daily   sodium  chloride flush  20 mL Intrapleural Q6H   sodium chloride flush  3 mL Intravenous Q12H    Continuous Infusions:  sodium chloride 10 mL/hr at 12/04/22 0503   ampicillin-sulbactam (UNASYN) IV 3 g (12/06/22 0543)    PRN Medications:  acetaminophen **OR** acetaminophen, chlorpheniramine-HYDROcodone, clonazePAM, ondansetron **OR** ondansetron (ZOFRAN) IV, sorbitol, milk of mag, mineral oil, glycerin (SMOG) enema  Antimicrobials from admission:  Anti-infectives (From admission, onward)    Start     Dose/Rate Route Frequency Ordered Stop   12/01/22 0015  Ampicillin-Sulbactam (UNASYN) 3 g in sodium chloride 0.9 % 100 mL IVPB        3 g 200 mL/hr over 30 Minutes Intravenous Every 6 hours 12/01/22 0010     11/30/22 2000  cefTRIAXone (ROCEPHIN) 1 g in sodium chloride 0.9 % 100 mL IVPB  Status:  Discontinued        1 g 200 mL/hr over 30 Minutes  Intravenous Every 24 hours 11/30/22 1948 11/30/22 2335   11/30/22 2000  azithromycin (ZITHROMAX) 500 mg in sodium chloride 0.9 % 250 mL IVPB        500 mg 250 mL/hr over 60 Minutes Intravenous Every 24 hours 11/30/22 1948 12/04/22 2122   11/30/22 1945  levofloxacin (LEVAQUIN) IVPB 750 mg  Status:  Discontinued        750 mg 100 mL/hr over 90 Minutes Intravenous  Once 11/30/22 1935 11/30/22 1948       Data Reviewed:  I have personally reviewed the following...  CBC: Recent Labs  Lab 12/01/22 0530 12/02/22 0751 12/03/22 0450 12/04/22 0449 12/05/22 0510  WBC 12.2* 13.2* 14.6* 9.8 6.9  NEUTROABS 9.8*  --   --   --   --   HGB 9.9* 11.5* 10.4* 9.0* 9.6*  HCT 30.5* 36.4* 31.9* 27.7* 29.6*  MCV 91.3 91.7 90.9 89.9 90.8  PLT 436* 428* 416* 374 367   Basic Metabolic Panel: Recent Labs  Lab 12/01/22 0530 12/01/22 1311 12/02/22 0751 12/03/22 0450 12/04/22 0449 12/05/22 0510  NA 129*  --  131* 130* 132* 132*  K 3.6 3.7 4.0 3.9 3.8 3.8  CL 99  --  96* 95* 96* 97*  CO2 24  --  28 27 29 28   GLUCOSE 100*  --  105* 122* 106* 105*  BUN 11  --  9 10 11 9   CREATININE 0.35*  --  0.44* 0.36* 0.34* 0.44*  CALCIUM 7.8*  --  8.1* 8.0* 7.7* 8.0*      LOS: 5 days    Time spent: 50 min    Leeroy Bock, DO [facility] 12/06/2022, 7:41 AM   If 7PM-7AM, please contact night coverage [email or web address]

## 2022-12-06 NOTE — Plan of Care (Signed)

## 2022-12-07 DIAGNOSIS — A419 Sepsis, unspecified organism: Secondary | ICD-10-CM | POA: Diagnosis not present

## 2022-12-07 DIAGNOSIS — G35 Multiple sclerosis: Secondary | ICD-10-CM | POA: Diagnosis not present

## 2022-12-07 DIAGNOSIS — J9601 Acute respiratory failure with hypoxia: Secondary | ICD-10-CM | POA: Diagnosis not present

## 2022-12-07 DIAGNOSIS — J189 Pneumonia, unspecified organism: Secondary | ICD-10-CM | POA: Diagnosis not present

## 2022-12-07 LAB — BASIC METABOLIC PANEL
Anion gap: 6 (ref 5–15)
BUN: 8 mg/dL (ref 8–23)
CO2: 29 mmol/L (ref 22–32)
Calcium: 8.2 mg/dL — ABNORMAL LOW (ref 8.9–10.3)
Chloride: 98 mmol/L (ref 98–111)
Creatinine, Ser: 0.47 mg/dL — ABNORMAL LOW (ref 0.61–1.24)
GFR, Estimated: >60 mL/min (ref >60)
Glucose, Bld: 113 mg/dL — ABNORMAL HIGH (ref 70–99)
Potassium: 4.4 mmol/L (ref 3.5–5.1)
Sodium: 133 mmol/L — ABNORMAL LOW (ref 135–145)

## 2022-12-07 LAB — CBC
HCT: 31.3 % — ABNORMAL LOW (ref 39.0–52.0)
Hemoglobin: 9.8 g/dL — ABNORMAL LOW (ref 13.0–17.0)
MCH: 29 pg (ref 26.0–34.0)
MCHC: 31.3 g/dL (ref 30.0–36.0)
MCV: 92.6 fL (ref 80.0–100.0)
Platelets: 371 10*3/uL (ref 150–400)
RBC: 3.38 MIL/uL — ABNORMAL LOW (ref 4.22–5.81)
RDW: 14.3 % (ref 11.5–15.5)
WBC: 4.8 10*3/uL (ref 4.0–10.5)
nRBC: 0 % (ref 0.0–0.2)

## 2022-12-07 MED ORDER — CARBAMIDE PEROXIDE 6.5 % OT SOLN
5.0000 [drp] | Freq: Two times a day (BID) | OTIC | Status: DC
  Administered 2022-12-07 – 2022-12-11 (×8): 5 [drp] via OTIC
  Filled 2022-12-07: qty 15

## 2022-12-07 NOTE — Care Management Important Message (Signed)
Important Message  Patient Details  Name: Ronald Santiago MRN: [id number] Date of Birth: Aug 29, 1945   Medicare Important Message Given:  Yes     Olegario Messier [name] 12/07/2022, 2:41 PM

## 2022-12-07 NOTE — Plan of Care (Signed)

## 2022-12-07 NOTE — Plan of Care (Signed)
Problem: Activity: Goal: Risk for activity intolerance will decrease Outcome: Progressing   Problem: Nutrition: Goal: Adequate nutrition will be maintained Outcome: Progressing   Problem: Coping: Goal: Level of anxiety will decrease Outcome: Progressing   Problem: Pain Managment: Goal: General experience of comfort will improve Outcome: Progressing

## 2022-12-07 NOTE — Plan of Care (Signed)
Lung abscess/Empyema Streptococcus species  continue ABX, TPA therapy completed still has infection. getting saline flushes...may consider repeat CT chest later this week.  I am not sure we can add anything else  consider hospice and palliative discussion needs ID consult is what I recommend for long term ABX IV or oral.  Sens Pending he is not a surgical candidate high risk for mobidity   Poor prognosis   Ronald Santiago [name], M.D.  [facility]  Medical Director [facility] Medical Director [facility] Cardio-Pulmonary Department

## 2022-12-07 NOTE — Progress Notes (Signed)
PROGRESS NOTE  Ronald Santiago   WUJ:[id number] DOB: 1946-02-27  DOA: 11/30/2022 Date of Service: 12/07/22 PCP: Mick Sell, MD  Brief Narrative / Hospital Course:  Ronald Santiago is a 77 y.o. 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 Few courses of abx since 08/2022 for presumed PNA/bronchitis.CXR 11/06/22 (+)RLL infiltrate. 11/27/22 SpO2 87% w/ home PT but pt refused to go to ED>  09/09: to ED. SpO2 80s improved on 3L [place] to 96%. WBC 16.8, sodium 128, Chest x-ray was obtained that demonstrated a large ovoid opacity in the right thorax. CT chest/abd/pelv (+) concern for pulmonary empyema, pneumonia, reactive hilar lymphadenopathy, colonic ileus/enteritis. Admitted to hospitalist service.  09/10: IR plan chest tube placement tomorrow, consult to pulmonary (Dr Belia Heman alerted and will see pt).  9/11: Chest tube placed without complication. Patient doing well post-procedure. Remains on 2L [place] 9/12: patient was febrile overnight tmax 100.7. blood cultures remain negative. Culture from pleural sample NGTD. Fibrinolytic therapy.  9/13: patient feeling better today. Afebrile >24 hours. Culture positive for streptococcus constellatus. Fibrinolytic therapy with follow up CT chest today. Palliative meeting with patient and wife today.  9/14-9/16: culture sensitivities still pending. Chest CT and xrays reveal improvement in overall status of empyema. Continuing with saline pleural flushes. Pulmonology continues to follow. Appreciate palliative involvement in ongoing discussions for disposition and GOC.    Consultants:  Interventional radiology Pulmonology    Procedures: Chest tube placement  9/11    ASSESSMENT & PLAN:  Principal Problem:   Acute hypoxic respiratory failure (HCC) Active Problems:   Lung mass   Hyponatremia   Leukocytosis   Stage IV decubitus ulcer (HCC)   Multiple sclerosis, primary progressive (HCC)   Flaccid neuropathic bladder, not elsewhere classified   Constipation   Acute hypoxic respiratory failure  lung mass/empyema, pneumonia  Continue supplemental oxygen to maintain oxygen saturation above 88%. Wean as tolerated but will likely require discharge with supplemental oxygen. Has been stable on 2-3L several days. Denies respiratory distress Chest PT Treat underlying causes as below Flutter valve, incentive spirometer    Lung abscess  empyema IR consult Chest tube placed 9/11. Labs pending.  Pulmonary consult - chest tube management, fibrinolytic therapy completed 3 days. Now on saline flushes. Chest CT today Continue IV Abx x7 days ceftriaxone and azithromycin---> unasyn. Culture sensitivities pending  Anemia of chronic disease- hgb steady. 11.5>10.4>9.0>9.6>9.9. no sign of acute bleeding. No clear blood in pleural drainage Transfusion threshold 7 - monitor CBC   Hyponatremia- stable/improving. Na+ 131>>>133. PO intake has diminished prior to arrival so likely dehydration related, ddx would include SIADH given pulmonary pathology   Monitor sodium   Constipation, concern for ileus- as seen on chest imaging. Had multiple small BMs since admission. States that for him, going 4 days in between BMs is normal. Continue home regimen, additional bowel regimen added Adding Enema prn   Flaccid neuropathic bladder, intermittent spasm Continue suprapubic catheter Also on oxybutynin for intermittent bladder spasm which causes urethral urine leakage    Multiple sclerosis, primary progressive (HCC) History of primary progressive MS, currently bedbound. Continue home oxycodone, baclofen and gabapentin At home takes "4 ibuprofen in the morning and 4 in  the evening" and has for several years. Discussed the  high risk of side effects of this regimen with patient and wife and recommended tylenol and trial of naproxen PRN Mobilize in bed / OOB to chair as able  Has been made DNI/DNR.  Palliative consulted.  Meeting to discuss GOC 9/13   Stage IV decubitus ulcer (HCC) Per patient's wife, patient has chronic ulcerations and HH visits to help manage.  routine care WOC consult    Concern for malnutrition/underweight based on BMI: Body mass index is 23.06 kg/m.   Hearing decrease- likely cerumen impaction - examine with otoscope - debrox drops - ear lavage   DVT prophylaxis: lovenox  IV fluids: LR continuous IV fluids  Nutrition: regular diet Central lines / invasive devices: chronic suprapubic catheter, R chest tube   Code Status: DNR ACP documentation reviewed: none on file in [facility]   TOC needs: none at this time Barriers to dispo / significant pending items: clinical improvement, address lung mass, await cultures, expect will be here few days  Subjective / Brief ROS:  Patient reports doing alright today. They are looking forward to getting a plan for getting discharged from the hospital  Family Communication: wife is at bedside on rounds   Objective Findings:  Vitals:   12/05/22 1720 12/06/22 0916 12/06/22 1709 12/06/22 2301  BP: 110/67 108/65 110/72 124/74  Pulse: 78 74 81 70  Resp: 16 16 16 16   Temp: 98.8 F (37.1 C) 98.2 F (36.8 C) 98.3 F (36.8 C) 98.1 F (36.7 C)  TempSrc:      SpO2: 98% 97% 97% 97%  Weight:      Height:        Intake/Output Summary (Last 24 hours) at 12/07/2022 0725 Last data filed at 12/07/2022 0530 Gross per 24 hour  Intake 63 ml  Output 2180 ml  Net -2117 ml   Filed Weights   11/30/22 1910  Weight: 77.1 kg   Examination:  Physical Exam Constitutional:      General: He is not in acute distress. Cardiovascular:     Rate and Rhythm: Normal rate and regular rhythm.  Pulmonary:      Breath sounds: Examination of the right-middle field reveals decreased breath sounds. Examination of the right-lower field reveals decreased breath sounds. Decreased breath sounds present.     Comments: Chest tube in place right side. Green opaque drainage Abdominal:     Palpations: Abdomen is soft.     Tenderness: There is no guarding or rebound.  Musculoskeletal:     Right lower leg: No edema.     Left lower leg: No edema.  Skin:    General: Skin is warm and dry.  Neurological:     Mental Status: He is alert and oriented to person, place, and time.  Psychiatric:        Mood and Affect: Mood normal.        Behavior: Behavior normal.   Scheduled Medications:   baclofen  20 mg Oral BID WC   baclofen  40 mg Oral QHS   enoxaparin (LOVENOX) injection  40 mg Subcutaneous Q24H   gabapentin  1,200 mg Oral TID   loratadine  10 mg Oral Daily   magnesium oxide  400 mg Oral Daily   naloxegol oxalate  25 mg Oral Daily   naproxen  500 mg Oral BID WC   oxybutynin  5 mg Oral TID   oxyCODONE  10 mg Oral Q8H   polyethylene glycol  17 g Oral Daily   sodium chloride flush  20 mL  Intrapleural Q6H   sodium chloride flush  3 mL Intravenous Q12H   Continuous Infusions:  sodium chloride 10 mL/hr at 12/04/22 0503   ampicillin-sulbactam (UNASYN) IV 3 g (12/07/22 0529)   Antimicrobials from admission:  Anti-infectives (From admission, onward)    Start     Dose/Rate Route Frequency Ordered Stop   12/01/22 0015  Ampicillin-Sulbactam (UNASYN) 3 g in sodium chloride 0.9 % 100 mL IVPB        3 g 200 mL/hr over 30 Minutes Intravenous Every 6 hours 12/01/22 0010     11/30/22 2000  cefTRIAXone (ROCEPHIN) 1 g in sodium chloride 0.9 % 100 mL IVPB  Status:  Discontinued        1 g 200 mL/hr over 30 Minutes Intravenous Every 24 hours 11/30/22 1948 11/30/22 2335   11/30/22 2000  azithromycin (ZITHROMAX) 500 mg in sodium chloride 0.9 % 250 mL IVPB        500 mg 250 mL/hr over 60 Minutes Intravenous Every 24  hours 11/30/22 1948 12/04/22 2122   11/30/22 1945  levofloxacin (LEVAQUIN) IVPB 750 mg  Status:  Discontinued        750 mg 100 mL/hr over 90 Minutes Intravenous  Once 11/30/22 1935 11/30/22 1948       Data Reviewed:  I have personally reviewed the following...  CBC: Recent Labs  Lab 12/01/22 0530 12/02/22 0751 12/03/22 0450 12/04/22 0449 12/05/22 0510 12/06/22 0827 12/07/22 0530  WBC 12.2*   < > 14.6* 9.8 6.9 4.3 4.8  NEUTROABS 9.8*  --   --   --   --   --   --   HGB 9.9*   < > 10.4* 9.0* 9.6* 9.9* 9.8*  HCT 30.5*   < > 31.9* 27.7* 29.6* 31.6* 31.3*  MCV 91.3   < > 90.9 89.9 90.8 91.9 92.6  PLT 436*   < > 416* 374 367 432* 371   < > = values in this interval not displayed.   Basic Metabolic Panel: Recent Labs  Lab 12/03/22 0450 12/04/22 0449 12/05/22 0510 12/06/22 0827 12/07/22 0530  NA 130* 132* 132* 133* 133*  K 3.9 3.8 3.8 4.0 4.4  CL 95* 96* 97* 98 98  CO2 27 29 28 27 29   GLUCOSE 122* 106* 105* 99 113*  BUN 10 11 9  7* 8  CREATININE 0.36* 0.34* 0.44* 0.35* 0.47*  CALCIUM 8.0* 7.7* 8.0* 8.4* 8.2*      LOS: 6 days    Time spent: 50 min    Lorene Klimas Jannette Fogo, DO [facility] 12/07/2022, 7:25 AM   If 7PM-7AM, please contact night coverage [email or web address]

## 2022-12-08 DIAGNOSIS — J9601 Acute respiratory failure with hypoxia: Secondary | ICD-10-CM | POA: Diagnosis not present

## 2022-12-08 DIAGNOSIS — N312 Flaccid neuropathic bladder, not elsewhere classified: Secondary | ICD-10-CM | POA: Diagnosis not present

## 2022-12-08 DIAGNOSIS — G35 Multiple sclerosis: Secondary | ICD-10-CM | POA: Diagnosis not present

## 2022-12-08 DIAGNOSIS — R059 Cough, unspecified: Secondary | ICD-10-CM | POA: Diagnosis not present

## 2022-12-08 DIAGNOSIS — J869 Pyothorax without fistula: Secondary | ICD-10-CM | POA: Diagnosis not present

## 2022-12-08 DIAGNOSIS — A419 Sepsis, unspecified organism: Secondary | ICD-10-CM | POA: Diagnosis not present

## 2022-12-08 DIAGNOSIS — J189 Pneumonia, unspecified organism: Secondary | ICD-10-CM | POA: Diagnosis not present

## 2022-12-08 LAB — BASIC METABOLIC PANEL
Anion gap: 5 (ref 5–15)
BUN: 9 mg/dL (ref 8–23)
CO2: 32 mmol/L (ref 22–32)
Calcium: 8.4 mg/dL — ABNORMAL LOW (ref 8.9–10.3)
Chloride: 97 mmol/L — ABNORMAL LOW (ref 98–111)
Creatinine, Ser: 0.58 mg/dL — ABNORMAL LOW (ref 0.61–1.24)
GFR, Estimated: >60 mL/min (ref >60)
Glucose, Bld: 105 mg/dL — ABNORMAL HIGH (ref 70–99)
Potassium: 4.3 mmol/L (ref 3.5–5.1)
Sodium: 134 mmol/L — ABNORMAL LOW (ref 135–145)

## 2022-12-08 LAB — CBC
HCT: 30.8 % — ABNORMAL LOW (ref 39.0–52.0)
Hemoglobin: 9.7 g/dL — ABNORMAL LOW (ref 13.0–17.0)
MCH: 29 pg (ref 26.0–34.0)
MCHC: 31.5 g/dL (ref 30.0–36.0)
MCV: 91.9 fL (ref 80.0–100.0)
Platelets: 382 10*3/uL (ref 150–400)
RBC: 3.35 MIL/uL — ABNORMAL LOW (ref 4.22–5.81)
RDW: 14.4 % (ref 11.5–15.5)
WBC: 5.6 10*3/uL (ref 4.0–10.5)
nRBC: 0 % (ref 0.0–0.2)

## 2022-12-08 MED ORDER — LACTULOSE 10 GM/15ML PO SOLN
10.0000 g | Freq: Every day | ORAL | Status: DC | PRN
  Administered 2022-12-08: 10 g via ORAL
  Filled 2022-12-08: qty 30

## 2022-12-08 NOTE — TOC Progression Note (Signed)
Transition of Care [facility]) - Progression Note    Patient Details  Name: Ronald Santiago MRN: [id number] Date of Birth: May 13, 1945  Transition of Care [facility]) CM/SW Contact  Marlowe Sax, RN Phone Number: 12/08/2022, 4:11 PM  Clinical Narrative:     I spoke with the patient and his wife in the room, he refuses to go to Skilled Nursing, He stated that he has been before and will never return.  he said he will use non urgent medical transport to go to the doctor, he is open with HH thru [facility] and they ([place]) confirmed that they will manage the chest tube at home will need orders of course, they would like the wife to get some teaching here before DC   Expected Discharge Plan: Home w Home Health Services Barriers to Discharge: Continued Medical Work up  Expected Discharge Plan and Services   Discharge Planning Services: CM Consult   Living arrangements for the past 2 months: Single Family Home                 DME Arranged: Hospital bed DME Agency: [employer] Date DME Agency Contacted: 12/08/22 Time DME Agency Contacted: 1451 Representative spoke with at DME Agency: [facility] HH Arranged: PT, OT HH Agency: [facility] Date [facility] Agency Contacted: 12/08/22 Time HH Agency Contacted: 1451 Representative spoke with at [facility] Agency: [place]   Social Determinants of Health (SDOH) Interventions SDOH Screenings   Food Insecurity: Patient Unable To Answer (12/01/2022)  Housing: Patient Unable To Answer (12/01/2022)  Transportation Needs: Patient Unable To Answer (12/01/2022)  Utilities: Patient Unable To Answer (12/01/2022)  Financial Resource Strain: Patient Declined (10/28/2022)   Received from [facility]  Social Connections: Unknown (08/05/2021)   Received from [facility]  Tobacco Use: Low Risk  (12/01/2022)  Recent Concern: Tobacco Use - Medium Risk (10/29/2022)   Received from [facility]    Readmission Risk Interventions      No data to display

## 2022-12-08 NOTE — Progress Notes (Signed)
PROGRESS NOTE  Ronald Santiago   GMW:[id number] DOB: 1945/08/30  DOA: 11/30/2022 Date of Service: 12/08/22 PCP: Mick Sell, MD  Brief Narrative / Hospital Course:  Ronald Santiago is a 77 y.o. 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 Few courses of abx since 08/2022 for presumed PNA/bronchitis.CXR 11/06/22 (+)RLL infiltrate. 11/27/22 SpO2 87% w/ home PT but pt refused to go to ED>  09/09: to ED. SpO2 80s improved on 3L [place] to 96%. WBC 16.8, sodium 128, Chest x-ray was obtained that demonstrated a large ovoid opacity in the right thorax. CT chest/abd/pelv (+) concern for pulmonary empyema, pneumonia, reactive hilar lymphadenopathy, colonic ileus/enteritis. Admitted to hospitalist service.  09/10: IR plan chest tube placement tomorrow, consult to pulmonary (Dr Belia Heman alerted and will see pt).  9/11: Chest tube placed without complication. Patient doing well post-procedure. Remains on 2L [place] 9/12: patient was febrile overnight tmax 100.7. blood cultures remain negative. Culture from pleural sample NGTD. Fibrinolytic therapy.  9/13: patient feeling better today. Afebrile >24 hours. Culture positive for streptococcus constellatus. Fibrinolytic therapy with follow up CT chest today. Palliative meeting with patient and wife today.  9/14-9/16: culture sensitivities still pending. Chest CT and xrays reveal improvement in overall status of empyema. Continuing with saline pleural flushes to prevent clotting. Pulmonology continues to follow. Appreciate palliative involvement in ongoing discussions for disposition and GOC.  9/17: Confirmed with pulmonology and IR that current chest tube can remain in  place 4-6 weeks and will need to continue to have saline flushes to prevent clotting. ID consulted and recommends remaining on unasyn while in the hospital and transition to augmentin for total 4 week course when discharged. Likely continuously aspirating and perpetuating the infection. Breathing status has been stable on 3-4L [place]. Palliative and TOC are in discussions with long-term planning with family and patient.    Consultants:  Interventional radiology Pulmonology  Palliative ID   Procedures: Chest tube placement 9/11    ASSESSMENT & PLAN:  Principal Problem:   Acute hypoxic respiratory failure (HCC) Active Problems:   Lung mass   Hyponatremia   Leukocytosis   Stage IV decubitus ulcer (HCC)   Multiple sclerosis, primary progressive (HCC)   Flaccid neuropathic bladder, not elsewhere classified   Constipation   Acute hypoxic respiratory failure  lung mass/empyema, pneumonia  Continue supplemental oxygen to maintain oxygen saturation above 88%. Wean as tolerated but will likely require discharge with supplemental oxygen. Has been stable on 3-4L several days. Denies respiratory distress Chest PT Treat underlying causes as below Flutter valve, incentive spirometer    Lung abscess  empyema IR consult Chest tube placed 9/11. Culture sensitivities still pending. Can stay in 4-6 weeks and will discharge in place. Can be pulled when drainage is less than 10cc per day. Will need outpatient follow up with pulmonology.  Pulmonary consult - chest tube management, fibrinolytic therapy completed 3 days. Now on saline flushes. Chest CT showing improvement in overall size.  Continue IV Abx x7 days ceftriaxone and azithromycin---> unasyn. Culture sensitivities pending Can transition to augmentin when discharged from hospital but remain on unasyn while hospitalized. Total 4 weeks treatment, per ID.   Anemia of chronic disease- hgb steady. 11.5>>>9.7. no sign of acute bleeding. No clear blood  in pleural drainage Transfusion threshold 7 - monitor CBC   Hyponatremia- stable/improving. Na+ 131>>>134. PO intake has diminished prior to arrival so likely dehydration related, ddx would include SIADH given pulmonary pathology   Monitor sodium   Constipation, concern for ileus- as seen on chest imaging. Had multiple small BMs since admission. States that for him, going 4 days in between BMs is normal. Continue home regimen, additional bowel regimen added Adding Enema prn   Flaccid neuropathic bladder, intermittent spasm Continue suprapubic catheter Also on oxybutynin for intermittent bladder spasm which causes urethral urine leakage    Multiple sclerosis, primary progressive (HCC) History of primary progressive MS, currently bedbound. Continue home oxycodone, baclofen and gabapentin At home takes "4 ibuprofen in the morning and 4 in the evening" and has for several years. Discussed the high risk of side effects of this regimen with patient and wife and recommended tylenol and trial of naproxen PRN Mobilize in bed / OOB to chair as able  Has been made DNI/DNR.  Palliative consulted.    Stage IV decubitus ulcer (HCC) Per patient's wife, patient has chronic ulcerations and HH visits to help manage.  routine care WOC consult    Concern for malnutrition/underweight based on BMI: Body mass index is 23.06 kg/m.   Hearing decrease- likely cerumen impaction - examine with otoscope - debrox drops - ear lavage   DVT prophylaxis: lovenox  IV fluids: LR continuous IV fluids  Nutrition: regular diet Central lines / invasive devices: chronic suprapubic catheter, R chest tube   Code Status: DNR ACP documentation reviewed: none on file in [facility]   TOC needs: none at this time Barriers to dispo / significant pending items: clinical improvement, address lung mass, await cultures, expect will be here few days  Subjective / Brief ROS:  Patient reports doing alright today. Denies  respiratory distress. Shares information about his time in the [place] and 2 trips to [place]. He and wife are undecided on the plan after discharge and need some more time to discuss options.   Family Communication: wife is at bedside on rounds   Objective Findings:  Vitals:   12/06/22 2301 12/07/22 0827 12/07/22 1535 12/08/22 0229  BP: 124/74 113/67 112/62 98/65  Pulse: 70 76 72 78  Resp: 16 14 14 14   Temp: 98.1 F (36.7 C) 97.6 F (36.4 C) 98.3 F (36.8 C) 97.8 F (36.6 C)  TempSrc:      SpO2: 97% 100% 98% 100%  Weight:      Height:        Intake/Output Summary (Last 24 hours) at 12/08/2022 0733 Last data filed at 12/08/2022 4782 Gross per 24 hour  Intake 140 ml  Output 1250 ml  Net -1110 ml   Filed Weights   11/30/22 1910  Weight: 77.1 kg   Examination:  Physical Exam Constitutional:      General: He is not in acute distress. Cardiovascular:     Rate and Rhythm: Normal rate and regular rhythm.  Pulmonary:     Breath sounds: Examination of the right-middle field reveals decreased breath sounds. Examination of the right-lower field reveals decreased breath sounds. Decreased breath sounds present.     Comments: Chest tube in place right side. Green opaque drainage Abdominal:     Palpations: Abdomen is soft.     Tenderness: There is no guarding or rebound.  Musculoskeletal:     Right lower leg: No edema.     Left lower leg: No edema.  Skin:  General: Skin is warm and dry.  Neurological:     Mental Status: He is alert and oriented to person, place, and time.  Psychiatric:        Mood and Affect: Mood normal.        Behavior: Behavior normal.   Scheduled Medications:   baclofen  20 mg Oral BID WC   baclofen  40 mg Oral QHS   carbamide peroxide  5 drop Both EARS BID   enoxaparin (LOVENOX) injection  40 mg Subcutaneous Q24H   gabapentin  1,200 mg Oral TID   loratadine  10 mg Oral Daily   magnesium oxide  400 mg Oral Daily   naloxegol oxalate  25 mg Oral Daily    naproxen  500 mg Oral BID WC   oxybutynin  5 mg Oral TID   oxyCODONE  10 mg Oral Q8H   polyethylene glycol  17 g Oral Daily   sodium chloride flush  20 mL Intrapleural Q6H   sodium chloride flush  3 mL Intravenous Q12H   Continuous Infusions:  sodium chloride 10 mL/hr at 12/04/22 0503   ampicillin-sulbactam (UNASYN) IV 3 g (12/08/22 0556)   Antimicrobials from admission:  Anti-infectives (From admission, onward)    Start     Dose/Rate Route Frequency Ordered Stop   12/01/22 0015  Ampicillin-Sulbactam (UNASYN) 3 g in sodium chloride 0.9 % 100 mL IVPB        3 g 200 mL/hr over 30 Minutes Intravenous Every 6 hours 12/01/22 0010     11/30/22 2000  cefTRIAXone (ROCEPHIN) 1 g in sodium chloride 0.9 % 100 mL IVPB  Status:  Discontinued        1 g 200 mL/hr over 30 Minutes Intravenous Every 24 hours 11/30/22 1948 11/30/22 2335   11/30/22 2000  azithromycin (ZITHROMAX) 500 mg in sodium chloride 0.9 % 250 mL IVPB        500 mg 250 mL/hr over 60 Minutes Intravenous Every 24 hours 11/30/22 1948 12/04/22 2122   11/30/22 1945  levofloxacin (LEVAQUIN) IVPB 750 mg  Status:  Discontinued        750 mg 100 mL/hr over 90 Minutes Intravenous  Once 11/30/22 1935 11/30/22 1948       Data Reviewed:  I have personally reviewed the following...  CBC: Recent Labs  Lab 12/04/22 0449 12/05/22 0510 12/06/22 0827 12/07/22 0530 12/08/22 0422  WBC 9.8 6.9 4.3 4.8 5.6  HGB 9.0* 9.6* 9.9* 9.8* 9.7*  HCT 27.7* 29.6* 31.6* 31.3* 30.8*  MCV 89.9 90.8 91.9 92.6 91.9  PLT 374 367 432* 371 382   Basic Metabolic Panel: Recent Labs  Lab 12/04/22 0449 12/05/22 0510 12/06/22 0827 12/07/22 0530 12/08/22 0422  NA 132* 132* 133* 133* 134*  K 3.8 3.8 4.0 4.4 4.3  CL 96* 97* 98 98 97*  CO2 29 28 27 29  32  GLUCOSE 106* 105* 99 113* 105*  BUN 11 9 7* 8 9  CREATININE 0.34* 0.44* 0.35* 0.47* 0.58*  CALCIUM 7.7* 8.0* 8.4* 8.2* 8.4*      LOS: 7 days    Time spent: 50 min    Leeroy Bock,  DO [facility] 12/08/2022, 7:33 AM   If 7PM-7AM, please contact night coverage [email or web address]

## 2022-12-08 NOTE — Progress Notes (Addendum)
NAME:  Ronald Santiago, MRN:  [id number], DOB:  12/27/1945, LOS: 7 ADMISSION DATE:  11/30/2022, CHIEF COMPLAINT:  Empyema   History of Present Illness:  Patient is a 77 year old male presenting to the hospital with shortness of breath and hypoxemia.  He was diagnosed with empyema and admitted to medicine.  Chest tube has been placed and the patient was initiated on intrapleural fibrinolytic therapy.  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  Patient has had multiple recent pneumonias for which she required antibiotic therapy.  He presented to the ED with increased shortness of breath, cough, and fever.  He was noted to have leukocytosis with a chest x-ray concerning for an opacity in the right lower lung field.  IR guided 10 French chest tube was placed on 9/10 and the patient has received 2 doses of fibrinolytic therapy on 9/11 and 9/12.  Today, he feels unchanged.  He continues to have pain in his chest and continues to feel weak.  He is more alert and awake compared to prior.  Pertinent  Medical History  Multiple sclerosis Suprapubic catheter Pressure ulcer of the sacrum chronic pain Pneumonia  SUBJECTIVE More alert and awake Still with rhonchi No fevers Chest tube in place   Objective   Blood pressure 128/65, pulse 70, temperature 97.7 F (36.5 C), resp. rate 16, height 6' (1.829 m), weight 77.1 kg, SpO2 95%.        Intake/Output Summary (Last 24 hours) at 12/08/2022 1330 Last data filed at 12/08/2022 0900 Gross per 24 hour  Intake 20 ml  Output 1650 ml  Net -1630 ml   Filed Weights   11/30/22 1910  Weight: 77.1 kg       Review of Systems: Gen:  Denies  fever, sweats, chills weight loss  HEENT: Denies blurred vision, double vision, ear pain, eye pain, hearing loss, nose bleeds, sore throat Cardiac:  No dizziness, chest pain or heaviness, chest  tightness,edema, No JVD Resp:   mild prod  cough, +sputum production, -shortness of breath,-wheezing, -hemoptysis,  Other:  All other systems negative   Physical Examination:   General Appearance: No distress  EYES PERRLA, EOM intact.   NECK Supple, No JVD Pulmonary: normal breath sounds,+ rhconchi 10FR catheter into RT chest wall. Poor cough and resp effort CardiovascularNormal S1,S2.  No m/r/g.   Abdomen: Benign, Soft, non-tender. Neurology UE/LE 1/5 strength Ext pulses intact, cap refill intact ALL OTHER ROS ARE NEGATIVE    Assessment & Plan:    Right Sided Empyema/Lung abscess S/p TNK ase and Dornase therapy 4 days Saline flushes Drain in place which can stay in 4-6 weeks Repeat CT scans show improvement but remains infected -NS flushes intrapleurally every 6 hours (15 mL towards the patient, 5 mL towards the Pleur-evac) -Continue broad-spectrum antibiotics STREP SPECIES on cultures-ID consulted  Acute Hypoxic Respiratory Failure Oxygen as needed TOC needed to establish home health versus SNF ?LTACH ID consult for ABX regimen  Talked with wife and patient extensively  Total time spent 52 mins    Halim Surrette [name], M.D.  [facility]  Medical Director [facility] Medical Director [facility] Cardio-Pulmonary Department

## 2022-12-08 NOTE — TOC Progression Note (Addendum)
Transition of Care [facility]) - Progression Note    Patient Details  Name: Ronald Santiago MRN: [id number] Date of Birth: [birth year]  Transition of Care [facility]) CM/SW Contact  Marlowe Sax, RN Phone Number: 12/08/2022, 2:54 PM  Clinical Narrative:    Met with the patient and his wife in the room at the bedside, he is currently open with [facility] and would like to continue to use them he has DME at home but they stated that they need a new hospital bed, I explained I am not sure if it would be covered by Ins but I would check on it,  He will need EMS to go home His wife reports that they Use EMS to get to appointments and they utilize virtual appointments as well TOC monitoring to see if they will need Home oxygen, Palliative NP saw them and they are agreeable to outpatient palliative services but not ready for Hospice   Expected Discharge Plan: Home w Home Health Services Barriers to Discharge: Continued Medical Work up  Expected Discharge Plan and Services   Discharge Planning Services: CM Consult   Living arrangements for the past 2 months: Single Family Home                 DME Arranged: Hospital bed DME Agency: [employer] Date DME Agency Contacted: 12/08/22 Time DME Agency Contacted: 1451 Representative spoke with at DME Agency: Marthann Schiller HH Arranged: PT, OT HH Agency: [facility] Date [facility] Agency Contacted: 12/08/22 Time HH Agency Contacted: 1451 Representative spoke with at [facility] Agency: [place]   Social Determinants of Health (SDOH) Interventions SDOH Screenings   Food Insecurity: Patient Unable To Answer (12/01/2022)  Housing: Patient Unable To Answer (12/01/2022)  Transportation Needs: Patient Unable To Answer (12/01/2022)  Utilities: Patient Unable To Answer (12/01/2022)  Financial Resource Strain: Patient Declined (10/28/2022)   Received from [facility]  Social Connections: Unknown (08/05/2021)   [facility] from [facility]  Tobacco Use: Low Risk  (12/01/2022)  Recent Concern: Tobacco Use - Medium Risk (10/29/2022)   Received from [facility]    Readmission Risk Interventions     No data to display

## 2022-12-08 NOTE — TOC Progression Note (Signed)
Transition of Care [facility]) - Progression Note    Patient Details  Name: Ronald Santiago MRN: [id number] Date of Birth: 1945/10/13  Transition of Care [facility]) CM/SW Contact  Marlowe Sax, RN Phone Number: 12/08/2022, 3:35 PM  Clinical Narrative:     Patient does not meet criteria to go to Long term Acute Care I called Geanie Berlin Liaison with [facility] [phone] to inquire if his medical needs meet criteria for STR, left a VM asking for a call back  Expected Discharge Plan: Home w Home Health Services Barriers to Discharge: Continued Medical Work up  Expected Discharge Plan and Services   Discharge Planning Services: CM Consult   Living arrangements for the past 2 months: Single Family Home                 DME Arranged: Hospital bed DME Agency: [employer] Date DME Agency Contacted: 12/08/22 Time DME Agency Contacted: 1451 Representative spoke with at DME Agency: Marthann Schiller HH Arranged: PT, OT HH Agency: [facility] Date [facility] Agency Contacted: 12/08/22 Time HH Agency Contacted: 1451 Representative spoke with at [facility] Agency: [place]   Social Determinants of Health (SDOH) Interventions SDOH Screenings   Food Insecurity: Patient Unable To Answer (12/01/2022)  Housing: Patient Unable To Answer (12/01/2022)  Transportation Needs: Patient Unable To Answer (12/01/2022)  Utilities: Patient Unable To Answer (12/01/2022)  Financial Resource Strain: Patient Declined (10/28/2022)   Received from [facility]  Social Connections: Unknown (08/05/2021)   Received from [facility]  Tobacco Use: Low Risk  (12/01/2022)  Recent Concern: Tobacco Use - Medium Risk (10/29/2022)   Received from [facility]    Readmission Risk Interventions     No data to display

## 2022-12-08 NOTE — Plan of Care (Signed)

## 2022-12-08 NOTE — Discharge Instructions (Signed)
These agencies are NOT affiliated with [facility] in any way, These are all Private Pay, not covered by [employer].  The cost will vary depending on level of care needed.  This list is used as a Sports administrator and not a recommendation.  PCS (Personal Care Service) service and placement assistance agency [facility],  Candie Chroman  [phone] Does not require a Contract or Min number of hours  PCS (Personal Care Service) service and placement assistance agency [facility] , Valeda Malm,  [phone] Does have a Minimum number of hours required  [facility] (Personal Care Service) [facility], Mellody Drown [phone] Does have a Minimum number of hours required

## 2022-12-08 NOTE — Consult Note (Signed)
NAME: Ronald Santiago  DOB: September 01, 1945  MRN: [id number]  Date/Time: 12/08/2022 11:47 AM  REQUESTING PROVIDER: Dr: Dareen Piano Subjective:  REASON FOR CONSULT: Empyema ? Ronald Santiago is a 77 y.o. with a history of MS bed bound, neurogenic bladder, SPC, chronic decubitus ulcers presented to ED on 11/29/22 with SOB and weakness, . HE has had a cough for few weeks -he was recently on levaquin  prescribed by his PCP for LRI on 11/16/22, with CXR showing a rt lower lobe infiltrate Vitals in the ED  11/30/22  BP 121/71  Temp 98.1 F (36.7 C)  Pulse Rate 102 !  Resp 17  SpO2 98 %  O2 Flow Rate (L/min) 2 L/min    Latest Reference Range & Units 11/30/22  WBC 4.0 - 10.5 K/uL 16.8 (H)  Hemoglobin 13.0 - 17.0 g/dL 40.9 (L)  HCT 81.1 - 91.4 % 37.0 (L)  Platelets 150 - 400 K/uL 524 (H)  Creatinine 0.61 - 1.24 mg/dL 7.82 (L)  CXR showed rt lung mass and CT chest showed  fluid collection confirmed possible empyema He had chest drain place don 9/11 and the culture is strep constellatus TPA X 4  instilled on 9/14 thru the pleural catrh Pt is edentulous, has some trouble swallowing certain foods like oat meal Past Medical History:  Diagnosis Date   Chronic kidney disease    Multiple sclerosis, primary progressive (HCC)     Past Surgical History:  Procedure Laterality Date   BACK SURGERY     CHOLECYSTECTOMY     EYE SURGERY      Social History   Socioeconomic History   Marital status: Married    Spouse name: Not on file   Number of children: Not on file   Years of education: Not on file   Highest education level: Not on file  Occupational History   Not on file  Tobacco Use   Smoking status: Never    Passive exposure: Never   Smokeless tobacco: Never  Substance and Sexual Activity   Alcohol use: Never   Drug use: Never   Sexual activity: Not on file  Other Topics Concern   Not on file  Social History Narrative   Not on file   Social Determinants of Health   Financial Resource Strain:  Patient Declined (10/28/2022)   Received [facility]   Overall Financial Resource Strain (CARDIA)    Difficulty of Paying Living Expenses: Patient declined  Food Insecurity: Patient Unable To Answer (12/01/2022)   Hunger Vital Sign    Worried About Running Out of Food in the Last Year: Patient unable to answer    Ran Out of Food in the Last Year: Patient unable to answer  Transportation Needs: Patient Unable To Answer (12/01/2022)   PRAPARE - Transportation    Lack of Transportation (Medical): Patient unable to answer    Lack of Transportation (Non-Medical): Patient unable to answer  Physical Activity: Not on file  Stress: Not on file  Social Connections: Unknown (08/05/2021)   [facility]   Social Network    Social Network: Not on file  Intimate Partner Violence: Patient Unable To Answer (12/01/2022)   Humiliation, Afraid, Rape, and Kick questionnaire    Fear of Current or Ex-Partner: Patient unable to answer    Emotionally Abused: Patient unable to answer    Physically Abused: Patient unable to answer    Sexually Abused: Patient unable to answer    History reviewed. No pertinent family  history. Allergies  Allergen Reactions   Cephalexin Anaphylaxis and Hives    Pt tolerated several doses of cefepime during hospitalization in 03/2021   I? Current Facility-Administered Medications  Medication Dose Route Frequency Provider Last Rate Last Admin   0.9 %  sodium chloride infusion   Intravenous Continuous Kennieth Francois, PA 10 mL/hr at 12/04/22 0503 Infusion Verify at 12/04/22 0503   acetaminophen (TYLENOL) tablet 650 mg  650 mg Oral Q6H PRN Verdene Lennert, MD   650 mg at 12/03/22 0932   Or   acetaminophen (TYLENOL) suppository 650 mg  650 mg Rectal Q6H PRN Verdene Lennert, MD       Ampicillin-Sulbactam (UNASYN) 3 g in sodium chloride 0.9 % 100 mL IVPB  3 g Intravenous Q6H Verdene Lennert, MD 200 mL/hr at 12/08/22 0556 3 g at  12/08/22 0556   baclofen (LIORESAL) tablet 20 mg  20 mg Oral BID WC Verdene Lennert, MD   20 mg at 12/08/22 0900   baclofen (LIORESAL) tablet 40 mg  40 mg Oral QHS Verdene Lennert, MD   40 mg at 12/07/22 2227   carbamide peroxide (DEBROX) 6.5 % OTIC (EAR) solution 5 drop  5 drop Both EARS BID Jamelle Rushing L, MD   5 drop at 12/08/22 1049   chlorpheniramine-HYDROcodone (TUSSIONEX) 10-8 MG/5ML suspension 5 mL  5 mL Oral Q12H PRN Verdene Lennert, MD   5 mL at 12/07/22 2226   clonazePAM (KLONOPIN) tablet 1 mg  1 mg Oral QHS PRN Verdene Lennert, MD   1 mg at 12/07/22 2227   enoxaparin (LOVENOX) injection 40 mg  40 mg Subcutaneous Q24H Verdene Lennert, MD   40 mg at 12/07/22 2226   gabapentin (NEURONTIN) capsule 1,200 mg  1,200 mg Oral TID Verdene Lennert, MD   1,200 mg at 12/08/22 0949   loratadine (CLARITIN) tablet 10 mg  10 mg Oral Daily Verdene Lennert, MD   10 mg at 12/08/22 0948   magnesium oxide (MAG-OX) tablet 400 mg  400 mg Oral Daily Verdene Lennert, MD   400 mg at 12/08/22 0949   naloxegol oxalate (MOVANTIK) tablet 25 mg  25 mg Oral Daily Verdene Lennert, MD   25 mg at 12/08/22 0950   naproxen (NAPROSYN) tablet 500 mg  500 mg Oral BID WC Jamelle Rushing L, MD   500 mg at 12/08/22 1048   ondansetron (ZOFRAN) tablet 4 mg  4 mg Oral Q6H PRN Verdene Lennert, MD       Or   ondansetron (ZOFRAN) injection 4 mg  4 mg Intravenous Q6H PRN Verdene Lennert, MD       oxybutynin (DITROPAN) tablet 5 mg  5 mg Oral TID Verdene Lennert, MD   5 mg at 12/08/22 0951   oxyCODONE (Oxy IR/ROXICODONE) immediate release tablet 10 mg  10 mg Oral Q8H Verdene Lennert, MD   10 mg at 12/08/22 0552   polyethylene glycol (MIRALAX / GLYCOLAX) packet 17 g  17 g Oral Daily Jamelle Rushing L, MD   17 g at 12/08/22 0952   sodium chloride flush (NS) 0.9 % injection 20 mL  20 mL Intrapleural Q6H Dgayli, Khabib, MD   20 mL at 12/08/22 0600   sodium chloride flush (NS) 0.9 % injection 3 mL  3 mL Intravenous Q12H Verdene Lennert, MD   3 mL at 12/08/22 0952   sorbitol, milk of mag, mineral oil, glycerin (SMOG) enema  960 mL Rectal Once PRN Leeroy Bock, MD  Abtx:  Anti-infectives (From admission, onward)    Start     Dose/Rate Route Frequency Ordered Stop   12/01/22 0015  Ampicillin-Sulbactam (UNASYN) 3 g in sodium chloride 0.9 % 100 mL IVPB        3 g 200 mL/hr over 30 Minutes Intravenous Every 6 hours 12/01/22 0010     11/30/22 2000  cefTRIAXone (ROCEPHIN) 1 g in sodium chloride 0.9 % 100 mL IVPB  Status:  Discontinued        1 g 200 mL/hr over 30 Minutes Intravenous Every 24 hours 11/30/22 1948 11/30/22 2335   11/30/22 2000  azithromycin (ZITHROMAX) 500 mg in sodium chloride 0.9 % 250 mL IVPB        500 mg 250 mL/hr over 60 Minutes Intravenous Every 24 hours 11/30/22 1948 12/04/22 2122   11/30/22 1945  levofloxacin (LEVAQUIN) IVPB 750 mg  Status:  Discontinued        750 mg 100 mL/hr over 90 Minutes Intravenous  Once 11/30/22 1935 11/30/22 1948       REVIEW OF SYSTEMS:  Const: negative fever, negative chills, negative weight loss Eyes: negative diplopia or visual changes, negative eye pain ENT: negative coryza, negative sore throat Resp: negative cough, hemoptysis, dyspnea Cards: negative for chest pain, palpitations, lower extremity edema GU: negative for frequency, dysuria and hematuria GI: Negative for abdominal pain, diarrhea, bleeding, constipation Skin: negative for rash and pruritus Heme: negative for easy bruising and gum/nose bleeding MS: negative for myalgias, arthralgias, back pain and muscle weakness Neurolo:negative for headaches, dizziness, vertigo, memory problems  Psych: negative for feelings of anxiety, depression  Endocrine: negative for thyroid, diabetes Allergy/Immunology- negative for any medication or food allergies ? Pertinent Positives include : Objective:  VITALS:  BP 128/65   Pulse 70   Temp 97.7 F (36.5 C)   Resp 16   Ht 6' (1.829 m)   Wt 77.1  kg   SpO2 95%   BMI 23.06 kg/m  LDA  PHYSICAL EXAM:  General: Alert, cooperative, no distress, appears stated age.  Head: Normocephalic, without obvious abnormality, atraumatic. Eyes: Conjunctivae clear, anicteric sclerae. Pupils are equal ENT Nares normal. No drainage or sinus tenderness. Lips, mucosa, and tongue normal. No Thrush edentulous Neck: Supple, symmetrical, no adenopathy, thyroid: non tender no carotid bruit and no JVD. Back: No CVA tenderness. Lungs:b/l air entry few crepts Decreased air entry rt base Heart: Regular rate and rhythm, no murmur, rub or gallop. Abdomen: Soft, non-tender,not distended. Bowel sounds normal. No masses SPC Extremities: atraumatic, no cyanosis. No edema. No clubbing Skin: ulcers leg lateral aspect Lymph: Cervical, supraclavicular normal. Neurologic: did not examine in detail Pertinent Labs Lab Results CBC    Component Value Date/Time   WBC 5.6 12/08/2022 0422   RBC 3.35 (L) 12/08/2022 0422   HGB 9.7 (L) 12/08/2022 0422   HCT 30.8 (L) 12/08/2022 0422   PLT 382 12/08/2022 0422   MCV 91.9 12/08/2022 0422   MCH 29.0 12/08/2022 0422   MCHC 31.5 12/08/2022 0422   RDW 14.4 12/08/2022 0422   LYMPHSABS 1.1 12/01/2022 0530   MONOABS 1.0 12/01/2022 0530   EOSABS 0.1 12/01/2022 0530   BASOSABS 0.1 12/01/2022 0530       Latest Ref Rng & Units 12/08/2022    4:22 AM 12/07/2022    5:30 AM 12/06/2022    8:27 AM  CMP  Glucose 70 - 99 mg/dL 161  096  99   BUN 8 - 23 mg/dL 9  8  7  Creatinine 0.61 - 1.24 mg/dL 2.84  1.32  4.40   Sodium 135 - 145 mmol/L 134  133  133   Potassium 3.5 - 5.1 mmol/L 4.3  4.4  4.0   Chloride 98 - 111 mmol/L 97  98  98   CO2 22 - 32 mmol/L 32  29  27   Calcium 8.9 - 10.3 mg/dL 8.4  8.2  8.4       Microbiology: Recent Results (from the past 240 hour(s))  Urine Culture     Status: Abnormal   Collection Time: 11/30/22  8:02 PM   Specimen: Urine, Random  Result Value Ref Range Status   Specimen Description    Final    URINE, RANDOM Performed at [facility], [address] [place], [place] [address]    Special Requests   Final    NONE Reflexed from [phone] Performed at [facility], [address] [place], [place] [address]    Culture (A)  Final    <10,000 COLONIES/mL INSIGNIFICANT GROWTH Performed at [facility], [address] [place], [place] [address]    Report Status 12/02/2022 FINAL  Final  Resp panel by RT-PCR (RSV, Flu A&B, Covid) Anterior Nasal Swab     Status: None   Collection Time: 11/30/22  8:03 PM   Specimen: Anterior Nasal Swab  Result Value Ref Range Status   SARS Coronavirus 2 by RT PCR NEGATIVE NEGATIVE Final    Comment: (NOTE) SARS-CoV-2 target nucleic acids are NOT DETECTED.  The SARS-CoV-2 RNA is generally detectable in upper respiratory specimens during the acute phase of infection. The lowest concentration of SARS-CoV-2 viral copies this assay can detect is 138 copies/mL. A negative result does not preclude SARS-Cov-2 infection and should not be used as the sole basis for treatment or other patient management decisions. A negative result may occur with  improper specimen collection/handling, submission of specimen other than nasopharyngeal swab, presence of viral mutation(s) within the areas targeted by this assay, and inadequate number of viral copies(<138 copies/mL). A negative result must be combined with clinical observations, patient history, and epidemiological information. The expected result is Negative.  Fact Sheet for Patients:  BloggerCourse.com  Fact Sheet for Healthcare Providers:  SeriousBroker.it  This test is no t yet approved or cleared by the [place] FDA and  has been authorized for detection and/or diagnosis of SARS-CoV-2 by FDA under an Emergency Use Authorization (EUA). This EUA will remain  in effect (meaning this test can be used) for the duration of  the COVID-19 declaration under Section 564(b)(1) of the Act, 21 U.S.C.section 360bbb-3(b)(1), unless the authorization is terminated  or revoked sooner.       Influenza A by PCR NEGATIVE NEGATIVE Final   Influenza B by PCR NEGATIVE NEGATIVE Final    Comment: (NOTE) The Xpert Xpress SARS-CoV-2/FLU/RSV plus assay is intended as an aid in the diagnosis of influenza from Nasopharyngeal swab specimens and should not be used as a sole basis for treatment. Nasal washings and aspirates are unacceptable for Xpert Xpress SARS-CoV-2/FLU/RSV testing.  Fact Sheet for Patients: BloggerCourse.com  Fact Sheet for Healthcare Providers: SeriousBroker.it  This test is not yet approved or cleared by the [place] FDA and has been authorized for detection and/or diagnosis of SARS-CoV-2 by FDA under an Emergency Use Authorization (EUA). This EUA will remain in effect (meaning this test can be used) for the duration of the COVID-19 declaration under Section 564(b)(1) of the Act, 21 U.S.C. section  360bbb-3(b)(1), unless the authorization is terminated or revoked.     Resp Syncytial Virus by PCR NEGATIVE NEGATIVE Final    Comment: (NOTE) Fact Sheet for Patients: BloggerCourse.com  Fact Sheet for Healthcare Providers: SeriousBroker.it  This test is not yet approved or cleared by the [place] FDA and has been authorized for detection and/or diagnosis of SARS-CoV-2 by FDA under an Emergency Use Authorization (EUA). This EUA will remain in effect (meaning this test can be used) for the duration of the COVID-19 declaration under Section 564(b)(1) of the Act, 21 U.S.C. section 360bbb-3(b)(1), unless the authorization is terminated or revoked.  Performed at [facility], [address] [place], [place] [address]   Blood Culture (routine x 2)     Status: None   Collection Time:  11/30/22  8:03 PM   Specimen: BLOOD  Result Value Ref Range Status   Specimen Description BLOOD BLOOD RIGHT ARM  Final   Special Requests   Final    BOTTLES DRAWN AEROBIC AND ANAEROBIC Blood Culture results may not be optimal due to an excessive volume of blood received in culture bottles   Culture   Final    NO GROWTH 5 DAYS Performed at [facility], [address] [place], [place] [address]    Report Status 12/05/2022 FINAL  Final  Blood Culture (routine x 2)     Status: None   Collection Time: 11/30/22  8:03 PM   Specimen: BLOOD LEFT ARM  Result Value Ref Range Status   Specimen Description   Final    BLOOD LEFT ARM Performed at [facility], [address] [place], [place] [address]    Special Requests   Final    BOTTLES DRAWN AEROBIC AND ANAEROBIC Blood Culture results may not be optimal due to an excessive volume of blood received in culture bottles Performed at [facility], [address] [place], [place] [address]    Culture  Setup Time   Final    ANAEROBIC BOTTLE ONLY Performed at [facility], [address] [place], [place] [address]    Culture   Final    NO GROWTH 5 DAYS Performed at [facility], [address] [place], [place] [address]    Report Status 12/06/2022 FINAL  Final  Aerobic/Anaerobic Culture w Gram Stain (surgical/deep wound)     Status: None (Preliminary result)   Collection Time: 12/02/22 10:37 AM   Specimen: Abscess  Result Value Ref Range Status   Specimen Description   Final    ABSCESS Performed at [facility], [address], [place], [place] [address]    Special Requests   Final    RIGHT EMPYEMA PLEURAL RIGHT Performed at [facility], [address] [place], [place] [address]    Gram Stain   Final    MODERATE WBC PRESENT, PREDOMINANTLY PMN RARE GRAM POSITIVE COCCI IN CLUSTERS    Culture   Final    MODERATE STREPTOCOCCUS CONSTELLATUS Sent to [facility] for further  susceptibility testing. NO ANAEROBES ISOLATED Performed at [facility], [address], [place], [place] [address]    Report Status PENDING  Incomplete    IMAGING RESULTS:  I have personally reviewed the films Large ovoid mass rt lung  ?CT chest - large walled off fluid collection rt hemithorax 12/06/22 repeat CXR  Much improved rt lung  Impression/Recommendation ? Rt empyema secondary to streptococcus constellatus- likely source oral cavity, aspiration S/p pleural drain placement and  TPA instillation-  On unasyn?- will need for a total of 4 weeks-10/7- may switch to Po augmentin on discharge Will discuss with radiologist to see whether lung abscess present  Leucocytosis- resolved Anemia  MS bed bound Neurogenic bladder has SPC  Pressure ulcers lateral aspect of legs? ___________________________________________________ Discussed with patient, requesting provider Note:  This document was prepared using Dragon voice recognition software and may include unintentional dictation errors.

## 2022-12-08 NOTE — Progress Notes (Signed)
Palliative Care Progress Note, Assessment & Plan   Patient Name: Ronald Santiago       Date: 12/08/2022 DOB: Apr 19, 1945  Age: 77 y.o. MRN#: [id number] Attending Physician: Leeroy Bock, MD Primary Care Physician: Mick Sell, MD Admit Date: 11/30/2022  Subjective: Patient is lying in bed in no apparent distress.  He acknowledges my presence and is able to make his wishes known.  He is feeding himself breakfast with his spoon.  His wife Ronald Santiago is at bedside during my visit.  HPI: 77 y.o. male  with past medical history of advanced primary progressive MS, predominantly bedbound, neurogenic bladder with suprapubic catheter, chronic pressure ulcer of the sacrum, chronic pain syndrome, and constipation admitted on 11/30/2022 with low oxygen, worsening cough, unable to clear sputum, and worsening shortness of breath x 1 week with a intermittent fever over 1 month.   08/2022, patient presumed to have pneumonia/bronchitis and followed course of antibiotic treatment.  H/16/2024, chest x-ray revealed right lower lobe infiltrate.  11/27/2022, oxygen reported by home PT is 87% but patient declined transfer to ED.   On admission, chest x-ray revealed large opacity in the right thorax.  CT of chest/abdomen/pelvis revealed concern for pulmonary empyema, PNA, reactive hilar lymphadenopathy, and colonic ileus/enteritis.   9/10, chest tube placed by IR and patient being followed by CCM.    PMT was consulted to discuss goals of care  Summary of counseling/coordination of care: Extensive chart review completed prior to meeting patient including labs, vital signs, imaging, progress notes, orders, and available advanced directive documents from current and previous encounters.   After reviewing the patient's chart and  assessing the patient at bedside, I spoke with patient and wife in regards to plan and goals of care.  Symptoms assessed.  Patient has no acute complaints at this time.  His wife endorses patient has no complaints of pain or discomfort.  No adjustment to [facility] needed at this time.  We discussed plan of care.  Patient's wife is open to outpatient palliative referral.  Outpatient palliative services discussed and referral placed.  Patient and wife are not prepared to initiate hospice benefits at this time.  Discussed that patient's chest tube is still in place and will likely be present at discharge.  Patient's wife shares she is fine with that and understands that he might have a higher level of care that she is unable to provide at home.  She is open to the possibility of having patient transferred to a nursing facility so that he can get skilled nursing.  She shares the ultimate goal is that he would be home with her but she wants to make sure what ever happens is what is best for him.  Above discussion conveyed to attending Dr. Dareen Piano.  DNR with limited interventions remains.  PMT will continue to follow and support patient and family throughout his hospitalization.  Physical Exam Vitals reviewed.  Constitutional:      General: He is not in acute distress. Eyes:     Pupils: Pupils are equal, round, and reactive to light.  Cardiovascular:     Rate and Rhythm: Normal rate.  Musculoskeletal:     Comments: Unable to [facility]  Skin:    General: Skin is warm and dry.     Coloration: Skin is pale.     Comments: UTA sacral wound  Neurological:     Mental Status: He is alert and oriented to person, place, and time.  Psychiatric:        Mood and Affect: Mood normal. Mood is not anxious.        Behavior: Behavior normal. Behavior is not agitated.             Time spent includes: Detailed review of medical records (labs, imaging, vital signs), medically appropriate exam (mental status,  respiratory, cardiac, skin), discussed with treatment team, counseling and educating patient/family, documenting clinical information, coordination of care.  Total Time 35 minutes   Lanna Labella L. Bonita Quin, DNP, FNP-BC Palliative Medicine Team

## 2022-12-09 ENCOUNTER — Inpatient Hospital Stay: Payer: Medicare Other

## 2022-12-09 DIAGNOSIS — J9601 Acute respiratory failure with hypoxia: Secondary | ICD-10-CM | POA: Diagnosis not present

## 2022-12-09 DIAGNOSIS — J189 Pneumonia, unspecified organism: Secondary | ICD-10-CM | POA: Diagnosis not present

## 2022-12-09 DIAGNOSIS — G35 Multiple sclerosis: Secondary | ICD-10-CM | POA: Diagnosis not present

## 2022-12-09 DIAGNOSIS — Z515 Encounter for palliative care: Secondary | ICD-10-CM | POA: Diagnosis not present

## 2022-12-09 MED ORDER — SENNOSIDES-DOCUSATE SODIUM 8.6-50 MG PO TABS
1.0000 | ORAL_TABLET | Freq: Two times a day (BID) | ORAL | Status: DC
  Administered 2022-12-09 – 2022-12-14 (×10): 1 via ORAL
  Filled 2022-12-09 (×11): qty 1

## 2022-12-09 MED ORDER — BISACODYL 10 MG RE SUPP
10.0000 mg | Freq: Every day | RECTAL | Status: DC | PRN
  Administered 2022-12-11: 10 mg via RECTAL
  Filled 2022-12-09: qty 1

## 2022-12-09 MED ORDER — DOCUSATE SODIUM 100 MG PO CAPS
400.0000 mg | ORAL_CAPSULE | Freq: Every day | ORAL | Status: DC
  Administered 2022-12-09 – 2022-12-13 (×4): 400 mg via ORAL
  Filled 2022-12-09 (×5): qty 4

## 2022-12-09 NOTE — Progress Notes (Signed)
Referring Physician(s): Dr. Lyn Hollingshead  Supervising Physician: Mir, Mauri Reading  Patient Status:  Ronald Santiago - In-pt  Chief Complaint: Empyema S/p right sided chest tube 9/11   Subjective: Patient resting comfortably. His wife is at the bedside. Patient states he is feeling ok.   Allergies: Cephalexin  Medications: Prior to Admission medications   Medication Sig Start Date End Date Taking? Authorizing Provider  baclofen (LIORESAL) 20 MG tablet Take 20-40 mg by mouth 3 (three) times daily. (20 mg in morning, 20 mg at noon, and 40 mg at bedtime) 05/21/20  Yes [provider]  cetirizine (ZYRTEC) 10 MG tablet Take 10 mg by mouth daily.   Yes [provider]  Cholecalciferol (D3-1000 PO) Take 1,000 Units by mouth daily.   Yes [provider]  clonazePAM (KLONOPIN) 1 MG tablet Take 1 mg by mouth 2 (two) times daily as needed. 08/31/22  Yes [provider]  docusate sodium (COLACE) 100 MG capsule Take 400 mg by mouth at bedtime.   Yes [provider]  gabapentin (NEURONTIN) 600 MG tablet Take 1,200 mg by mouth 3 (three) times daily. 02/14/19  Yes [provider]  ibuprofen (ADVIL) 200 MG tablet Take 800 mg by mouth in the morning and at bedtime.   Yes [provider]  magnesium oxide (MAG-OX) 400 MG tablet Take 1 tablet by mouth daily.   Yes [provider]  MOVANTIK 25 MG TABS tablet Take 25 mg by mouth daily. 05/21/20  Yes [provider]  Multiple Vitamin (MULTIVITAMIN WITH MINERALS) TABS tablet Take 1 tablet by mouth daily.   Yes [provider]  nystatin (MYCOSTATIN/NYSTOP) powder SMARTSIG:1 Application Topical 2-3 Times Daily 06/13/20  Yes [provider]  oxybutynin (DITROPAN-XL) 10 MG 24 hr tablet Take 1 tablet (10 mg total) by mouth 3 (three) times daily. 07/13/22 07/10/23 Yes MacDiarmid, Lorin Picket, MD  oxyCODONE (OXY IR/ROXICODONE) 5 MG immediate release tablet Take 10 mg by mouth every 8 (eight) hours.  11/26/22 05/25/23 Yes [provider]  Probiotic Product (PROBIOTIC-10 PO) Take by mouth.   Yes [provider]  Pumpkin Seed 500 MG CAPS Take 1,000 mg by mouth daily.   Yes [provider]  Zinc Oxide-Vitamin C (ZINC PLUS VITAMIN C PO) Take 1 tablet by mouth 2 (two) times daily.   Yes [provider]  oxyCODONE (OXY IR/ROXICODONE) 5 MG immediate release tablet Take 5 mg by mouth every 4 (four) hours as needed for moderate pain or severe pain. Patient not taking: Reported on 11/30/2022 03/13/21   [provider]     Vital Signs: BP 105/73 (BP Location: Left Arm)   Pulse 82   Temp 98.1 F (36.7 C)   Resp 16   Ht 6' (1.829 m)   Wt 170 lb (77.1 kg)   SpO2 99%   BMI 23.06 kg/m   Physical Exam Constitutional:      General: He is not in acute distress. Cardiovascular:     Rate and Rhythm: Normal rate.  Pulmonary:     Effort: Pulmonary effort is normal.     Comments: Right chest tube. Dressing is clean and dry.   Neurological:     Mental Status: He is alert and oriented to person, place, and time.  Psychiatric:        Mood and Affect: Mood normal.        Behavior: Behavior normal.        Thought Content: Thought content normal.  Judgment: Judgment normal.     Imaging: DG Chest Port 1 View  Result Date: 12/09/2022 CLINICAL DATA:  [id number] Chest tube in place [id number] EXAM: PORTABLE CHEST 1 VIEW COMPARISON:  December 06, 2022 FINDINGS: The cardiomediastinal silhouette is unchanged in contour.RIGHT chest tube. Similar pleural thickening ening of the RIGHT apex. No significant pneumothorax. Persistent RIGHT paramediastinal opacity. IMPRESSION: Similar appearance of RIGHT chest tube. No significant pneumothorax. Electronically Signed   By: Meda Klinefelter M.D.   On: 12/09/2022 09:05   DG Chest 2 View  Result Date: 12/06/2022 CLINICAL DATA:  Empyema EXAM: CHEST - 2 VIEW COMPARISON:  the previous day's study FINDINGS: Stable pigtail drain  catheter posterior right hemithorax. No pneumothorax. No significant pleural effusion. Hazy opacities in the right mid and lower lung stable. Left lung clear. Heart size and mediastinal contours are within normal limits. Visualized bones unremarkable. IMPRESSION: 1. Stable right pigtail drain catheter. No pneumothorax or significant pleural effusion. 2. Stable right mid and lower lung opacities. Electronically Signed   By: Corlis Leak M.D.   On: 12/06/2022 09:26    Labs:  CBC: Recent Labs    12/06/22 0827 12/07/22 0530 12/08/22 0422 12/09/22 0311  WBC 4.3 4.8 5.6 6.2  HGB 9.9* 9.8* 9.7* 10.0*  HCT 31.6* 31.3* 30.8* 31.1*  PLT 432* 371 382 394    COAGS: Recent Labs    12/02/22 0751  INR 1.4*    BMP: Recent Labs    12/06/22 0827 12/07/22 0530 12/08/22 0422 12/09/22 0311  NA 133* 133* 134* 134*  K 4.0 4.4 4.3 4.6  CL 98 98 97* 95*  CO2 27 29 32 30  GLUCOSE 99 113* 105* 108*  BUN 7* 8 9 12   CALCIUM 8.4* 8.2* 8.4* 8.4*  CREATININE 0.35* 0.47* 0.58* 0.56*  GFRNONAA >60 >60 >60 >60    LIVER FUNCTION TESTS: Recent Labs    12/03/22 0450  BILITOT 0.5  AST 27  ALT 29  ALKPHOS 58  PROT 6.9  ALBUMIN 2.1*    Assessment and Plan:  Empyema S/p right sided chest tube 9/11   Patient with pending plans for discharge home. IR asked by Pulmonary team to remove chest tube. I went to the bedside to remove the tube and the patient's wife stated the plan is for the patient to keep the chest tube in place. She states Home Health will help take care of the tube 3x/week. Patient's wife requested further clarification/conversations with the Pulmonary team and Case management.   Patient's team made aware. Chest tube remains in place.   Electronically Signed: Alwyn Ren, AGACNP-BC [phone] 12/09/2022, 3:30 PM   I spent a total of 15 Minutes at the the patient's bedside AND on the patient's hospital floor or unit, greater than 50% of which was counseling/coordinating care  for right chest tube.

## 2022-12-09 NOTE — TOC Progression Note (Signed)
Transition of Care [facility]) - Progression Note    Patient Details  Name: Ronald Santiago MRN: [id number] Date of Birth: Feb 08, 1946  Transition of Care [facility]) CM/SW Contact  Marlowe Sax, RN Phone Number: 12/09/2022, 1:44 PM  Clinical Narrative:    Reached out to Fayrene Fearing at [place] asking for a hospital bed and air mattress They will call the patient's wife to arrange   Expected Discharge Plan: Home w Home Health Services Barriers to Discharge: Continued Medical Work up  Expected Discharge Plan and Services   Discharge Planning Services: CM Consult   Living arrangements for the past 2 months: Single Family Home                 DME Arranged: Hospital bed DME Agency: [employer] Date DME Agency Contacted: 12/08/22 Time DME Agency Contacted: 1451 Representative spoke with at DME Agency: Marthann Schiller HH Arranged: PT, OT HH Agency: [facility] Date [facility] Agency Contacted: 12/08/22 Time HH Agency Contacted: 1451 Representative spoke with at [facility] Agency: [place]   Social Determinants of Health (SDOH) Interventions SDOH Screenings   Food Insecurity: Patient Unable To Answer (12/01/2022)  Housing: Patient Unable To Answer (12/01/2022)  Transportation Needs: Patient Unable To Answer (12/01/2022)  Utilities: Patient Unable To Answer (12/01/2022)  Financial Resource Strain: Patient Declined (10/28/2022)   Received from [facility]  Social Connections: Unknown (08/05/2021)   Received from [facility]  Tobacco Use: Low Risk  (12/01/2022)  Recent Concern: Tobacco Use - Medium Risk (10/29/2022)   Received from [facility]    Readmission Risk Interventions     No data to display

## 2022-12-09 NOTE — Progress Notes (Signed)
PROGRESS NOTE  Ronald Santiago   ZOX:[id number] DOB: [birth year]  DOA: 11/30/2022 Date of Service: 12/09/22 PCP: Mick Sell, MD  Brief Narrative / Hospital Course:  Ronald Santiago is a 77 y.o. 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 Few courses of abx since 08/2022 for presumed PNA/bronchitis.CXR 11/06/22 (+)RLL infiltrate. 11/27/22 SpO2 87% w/ home PT but pt refused to go to ED>  09/09: to ED. SpO2 80s improved on 3L [place] to 96%. WBC 16.8, sodium 128, Chest x-ray was obtained that demonstrated a large ovoid opacity in the right thorax. CT chest/abd/pelv (+) concern for pulmonary empyema, pneumonia, reactive hilar lymphadenopathy, colonic ileus/enteritis. Admitted to hospitalist service.  09/10: IR plan chest tube placement tomorrow, consult to pulmonary (Dr Belia Heman alerted and will see pt).  9/11: Chest tube placed without complication. Patient doing well post-procedure. Remains on 2L [place] 9/12: patient was febrile overnight tmax 100.7. blood cultures remain negative. Culture from pleural sample NGTD. Fibrinolytic therapy.  9/13: patient feeling better today. Afebrile >24 hours. Culture positive for streptococcus constellatus. Fibrinolytic therapy with follow up CT chest today. Palliative meeting with patient and wife today.  9/14-9/16: culture sensitivities still pending. Chest CT and xrays reveal improvement in overall status of empyema. Continuing with saline pleural flushes to prevent clotting. Pulmonology continues to follow. Appreciate palliative involvement in ongoing discussions for disposition and GOC.  9/17: Confirmed with pulmonology and IR that current chest tube can remain in  place 4-6 weeks and will need to continue to have saline flushes to prevent clotting. ID consulted and recommends remaining on unasyn while in the hospital and transition to augmentin for total 4 week course when discharged. Likely continuously aspirating and perpetuating the infection. Breathing status has been stable on 3-4L [place]. Palliative and TOC are in discussions with long-term planning with family and patient.    Consultants:  Interventional radiology Pulmonology  Palliative ID   Procedures: Chest tube placement 9/11    ASSESSMENT & PLAN:  Principal Problem:   Acute hypoxic respiratory failure (HCC) Active Problems:   Lung mass   Hyponatremia   Leukocytosis   Stage IV decubitus ulcer (HCC)   Multiple sclerosis, primary progressive (HCC)   Flaccid neuropathic bladder, not elsewhere classified   Constipation   Acute hypoxic respiratory failure  lung mass/empyema, pneumonia  Continue supplemental oxygen to maintain oxygen saturation above 88%. Wean as tolerated but will likely require discharge with supplemental oxygen. Has been stable on 3-4L several days. Denies respiratory distress Chest PT Treat underlying causes as below Flutter valve, incentive spirometer    Lung abscess  empyema IR consult Chest tube placed 9/11. Culture sensitivities still pending.  Can stay in 4-6 weeks and will discharge in place.  Can be pulled when drainage is less than 10cc per day.  Will need outpatient follow up with pulmonology.  Pulmonary consult - chest tube management, fibrinolytic therapy completed 3 days.  Now on saline flushes.  Chest CT showing improvement in overall size.  Continue IV Abx x7 days ceftriaxone and azithromycin---> unasyn. Culture sensitivities pending Can transition to augmentin when discharged from hospital but remain on unasyn while hospitalized. Total 4 weeks treatment, per ID.   Anemia of chronic disease- hgb steady. 11.5>>>9.7. no sign of  acute bleeding. No  clear blood in pleural drainage Transfusion threshold 7 - monitor CBC   Hyponatremia- stable/improving. Na+ 131>>>134. PO intake has diminished prior to arrival so likely dehydration related, ddx would include SIADH given pulmonary pathology   Monitor sodium   Constipation, concern for ileus- as seen on chest imaging. Had multiple small BMs since admission. States that for him, going 4 days in between BMs is normal. Continue home regimen, additional bowel regimen added Adding Enema prn   Flaccid neuropathic bladder, intermittent spasm Continue suprapubic catheter Also on oxybutynin for intermittent bladder spasm which causes urethral urine leakage    Multiple sclerosis, primary progressive (HCC) History of primary progressive MS, currently bedbound. Continue home oxycodone, baclofen and gabapentin At home takes "4 ibuprofen in the morning and 4 in the evening" and has for several years. Discussed the high risk of side effects of this regimen with patient and wife and recommended tylenol and trial of naproxen PRN Mobilize in bed / OOB to chair as able  Has been made DNI/DNR.  Palliative consulted.    Stage IV decubitus ulcer (HCC) Per patient's wife, patient has chronic ulcerations and HH visits to help manage.  routine care WOC consult    Concern for malnutrition/underweight based on BMI: Body mass index is 23.06 kg/m.   Hearing decrease- likely cerumen impaction - examine with otoscope - debrox drops - ear lavage   DVT prophylaxis: lovenox  IV fluids: LR continuous IV fluids  Nutrition: regular diet Central lines / invasive devices: chronic suprapubic catheter, R chest tube   Code Status: DNR   TOC needs: HH RN, new hospital bed order  Barriers to dispo / significant pending items: clinical improvement, address lung mass, await cultures, anticipate dc in 1-2 days if further improvement   Subjective / Brief ROS:  Patient seen with wife at bedside this AM.  Pt has some  discomfort from chest tube, but not severe.  Cough is very weak, unable to produce phlegm.  No bowel movement since last Wed per his wife, despite on bowel regimen here. Wife requests his bedtime colace be resumed.  They are agreeable to training for chest tube management at home, if he needs to keep it in longer term.  They state pt needs new hospital bed at home.      Family Communication: wife at bedside on rounds   Objective Findings:  Vitals:   12/08/22 0855 12/08/22 1546 12/09/22 0009 12/09/22 0900  BP: 128/65 101/79 101/69 105/73  Pulse: 70 78 84 82  Resp: 16 18 18 16   Temp: 97.7 F (36.5 C) 98.2 F (36.8 C) 98.3 F (36.8 C) 98.1 F (36.7 C)  TempSrc:      SpO2: 95% 100% 97% 99%  Weight:      Height:        Intake/Output Summary (Last 24 hours) at 12/09/2022 1535 Last data filed at 12/09/2022 1422 Gross per 24 hour  Intake 240 ml  Output 2625 ml  Net -2385 ml   Filed Weights   11/30/22 1910  Weight: 77.1 kg   Examination:  Physical Exam  General exam: awake, alert, no acute distress HEENT: moist mucus membranes, hearing grossly normal  Respiratory system: CTAB diminished bases L>R, no wheezes, normal respiratory effort at rest. Cardiovascular system: normal S1/S2, RRR, no pedal edema.   Gastrointestinal system: soft, NT, ND, no HSM felt, +bowel sounds. Genitourinary system: suprapubic catheter in place Central nervous system: A&O x 3. no gross focal neurologic deficits, normal  speech Extremities: moves all, no edema Skin: dry, intact, normal temperature Psychiatry: normal mood, congruent affect, judgement and insight appear normal   Scheduled Medications:   baclofen  20 mg Oral BID WC   baclofen  40 mg Oral QHS   carbamide peroxide  5 drop Both EARS BID   docusate sodium  400 mg Oral QHS   enoxaparin (LOVENOX) injection  40 mg Subcutaneous Q24H   gabapentin  1,200 mg Oral TID   loratadine  10 mg Oral Daily   magnesium oxide  400 mg Oral Daily   naloxegol  oxalate  25 mg Oral Daily   naproxen  500 mg Oral BID WC   oxybutynin  5 mg Oral TID   oxyCODONE  10 mg Oral Q8H   polyethylene glycol  17 g Oral Daily   senna-docusate  1 tablet Oral BID   sodium chloride flush  20 mL Intrapleural Q6H   sodium chloride flush  3 mL Intravenous Q12H   Continuous Infusions:  sodium chloride 10 mL/hr at 12/04/22 0503   ampicillin-sulbactam (UNASYN) IV 3 g (12/09/22 1416)   Antimicrobials from admission:  Anti-infectives (From admission, onward)    Start     Dose/Rate Route Frequency Ordered Stop   12/01/22 0015  Ampicillin-Sulbactam (UNASYN) 3 g in sodium chloride 0.9 % 100 mL IVPB        3 g 200 mL/hr over 30 Minutes Intravenous Every 6 hours 12/01/22 0010     11/30/22 2000  cefTRIAXone (ROCEPHIN) 1 g in sodium chloride 0.9 % 100 mL IVPB  Status:  Discontinued        1 g 200 mL/hr over 30 Minutes Intravenous Every 24 hours 11/30/22 1948 11/30/22 2335   11/30/22 2000  azithromycin (ZITHROMAX) 500 mg in sodium chloride 0.9 % 250 mL IVPB        500 mg 250 mL/hr over 60 Minutes Intravenous Every 24 hours 11/30/22 1948 12/04/22 2122   11/30/22 1945  levofloxacin (LEVAQUIN) IVPB 750 mg  Status:  Discontinued        750 mg 100 mL/hr over 90 Minutes Intravenous  Once 11/30/22 1935 11/30/22 1948       Data Reviewed:  I have personally reviewed the following...  CBC: Recent Labs  Lab 12/05/22 0510 12/06/22 0827 12/07/22 0530 12/08/22 0422 12/09/22 0311  WBC 6.9 4.3 4.8 5.6 6.2  HGB 9.6* 9.9* 9.8* 9.7* 10.0*  HCT 29.6* 31.6* 31.3* 30.8* 31.1*  MCV 90.8 91.9 92.6 91.9 91.7  PLT 367 432* 371 382 394   Basic Metabolic Panel: Recent Labs  Lab 12/05/22 0510 12/06/22 0827 12/07/22 0530 12/08/22 0422 12/09/22 0311  NA 132* 133* 133* 134* 134*  K 3.8 4.0 4.4 4.3 4.6  CL 97* 98 98 97* 95*  CO2 28 27 29  32 30  GLUCOSE 105* 99 113* 105* 108*  BUN 9 7* 8 9 12   CREATININE 0.44* 0.35* 0.47* 0.58* 0.56*  CALCIUM 8.0* 8.4* 8.2* 8.4* 8.4*       LOS: 8 days    Time spent: 45 min    Pennie Banter, DO [facility] 12/09/2022, 3:35 PM   If 7PM-7AM, please contact night coverage [email or web address]

## 2022-12-09 NOTE — Plan of Care (Signed)

## 2022-12-09 NOTE — Progress Notes (Signed)
Palliative Care Progress Note, Assessment & Plan   Patient Name: Ronald Santiago       Date: 12/09/2022 DOB: 1945/10/11  Age: 77 y.o. MRN#: [id number] Attending Physician: Pennie Banter, DO Primary Care Physician: Mick Sell, MD Admit Date: 11/30/2022  Subjective: Patient is lying in bed, alert and awake.  He acknowledges my presence and is able to make his wishes known.  Dr. Denton Lank is at bedside during my visit as well as patient's wife.  HPI: 77 y.o. male  with past medical history of advanced primary progressive MS, predominantly bedbound, neurogenic bladder with suprapubic catheter, chronic pressure ulcer of the sacrum, chronic pain syndrome, and constipation admitted on 11/30/2022 with low oxygen, worsening cough, unable to clear sputum, and worsening shortness of breath x 1 week with a intermittent fever over 1 month.   08/2022, patient presumed to have pneumonia/bronchitis and followed course of antibiotic treatment.  H/16/2024, chest x-ray revealed right lower lobe infiltrate.  11/27/2022, oxygen reported by home PT is 87% but patient declined transfer to ED.   On admission, chest x-ray revealed large opacity in the right thorax.  CT of chest/abdomen/pelvis revealed concern for pulmonary empyema, PNA, reactive hilar lymphadenopathy, and colonic ileus/enteritis.   9/10, chest tube placed by IR and patient being followed by CCM.    PMT was consulted to discuss goals of care.  Summary of counseling/coordination of care: Extensive chart review completed prior to meeting patient including labs, vital signs, imaging, progress notes, orders, and available advanced directive documents from current and previous encounters.   After reviewing the patient's chart and assessing the patient at bedside, I  spoke with the patient and his wife in regards to plan and goals of care.  Plan remains for patient to discharge home with maximum home health support.  Chest tube is to remain in place.  Patient's wife shares she would like to receive education on how to care for chest tube.  Dr. Denton Lank confirmed and education for chest tube care as pending.  Additionally, patient will need to have a BM prior to discharge as he has not had 1 since 12/01/2022.  Discussed with patient and wife that plan remains to treat the treatable and to discharge with outpatient palliative services.  Wife confirms she would like outpatient palliative services to follow.  Referral placed for TOC to offer choice.  While plan is to go home with services to help wife care for patient, I discussed potential need for higher level of skilled nursing care in the future.  I acknowledged to both patient and wife that this would not be in line with patient's current wishes of being able to remain home and is independent as possible.  However, I cautioned them that allowing higher levels of nursing care could lift the caregiver burden off of wife and that this could lead to more quality time for 2 of them.  Patient was respectful and hearing my suggestions but remains focused on wanting to go home and avoiding any nursing facility placement ever.  Patient/family have PMT contact information and were encouraged to contact PMT with any future acute palliative needs.   PMT will monitor the patient peripherally and shadow his  chart. Please re-engage with PMT if goals change, at patient/family's request, or if patient's health deteriorates during hospitalization.    Physical Exam Vitals reviewed.  Constitutional:      General: He is not in acute distress. HENT:     Head: Normocephalic.  Eyes:     Pupils: Pupils are equal, round, and reactive to light.  Pulmonary:     Effort: Pulmonary effort is normal.  Musculoskeletal:     Comments:  Generalized weakness  Skin:    General: Skin is warm and dry.     Coloration: Skin is pale.  Neurological:     Mental Status: He is alert and oriented to person, place, and time.  Psychiatric:        Mood and Affect: Mood normal. Mood is not anxious.        Behavior: Behavior normal. Behavior is not agitated.             Time spent includes: Detailed review of medical records (labs, imaging, vital signs), medically appropriate exam (mental status, respiratory, cardiac, skin), discussed with treatment team, counseling and educating patient, family and staff, documenting clinical information, medication management and coordination of care.  Total Time 35 minutes   Marketta Valadez L. Bonita Quin, DNP, FNP-BC Palliative Medicine Team

## 2022-12-09 NOTE — Progress Notes (Signed)
   Patient has advanced MS and Chronic Sacral decubitus Ulcers  and is bed bound, which requires whole Body to be positioned in ways not feasible with a normal bed.

## 2022-12-10 ENCOUNTER — Inpatient Hospital Stay: Payer: Medicare Other

## 2022-12-10 DIAGNOSIS — J9601 Acute respiratory failure with hypoxia: Secondary | ICD-10-CM | POA: Diagnosis not present

## 2022-12-10 DIAGNOSIS — J869 Pyothorax without fistula: Secondary | ICD-10-CM | POA: Diagnosis not present

## 2022-12-10 MED ORDER — PNEUMOCOCCAL 20-VAL CONJ VACC 0.5 ML IM SUSY
0.5000 mL | PREFILLED_SYRINGE | INTRAMUSCULAR | Status: DC
  Filled 2022-12-10: qty 0.5

## 2022-12-10 MED ORDER — INFLUENZA VAC A&B SURF ANT ADJ 0.5 ML IM SUSY
0.5000 mL | PREFILLED_SYRINGE | INTRAMUSCULAR | Status: DC
  Filled 2022-12-10: qty 0.5

## 2022-12-10 MED ORDER — LACTULOSE 10 GM/15ML PO SOLN
20.0000 g | Freq: Two times a day (BID) | ORAL | Status: DC | PRN
  Administered 2022-12-10 – 2022-12-11 (×3): 20 g via ORAL
  Filled 2022-12-10 (×3): qty 30

## 2022-12-10 NOTE — Progress Notes (Signed)
PROGRESS NOTE  Ronald Santiago   YQM:[id number] DOB: 1945/08/11  DOA: 11/30/2022 Date of Service: 12/10/22 PCP: Mick Sell, MD  Brief Narrative / Hospital Course:  [name] is a 77 y.o. 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 Few courses of abx since 08/2022 for presumed PNA/bronchitis.CXR 11/06/22 (+)RLL infiltrate. 11/27/22 SpO2 87% w/ home PT but pt refused to go to ED>  09/09: to ED. SpO2 80s improved on 3L [place] to 96%. WBC 16.8, sodium 128, Chest x-ray was obtained that demonstrated a large ovoid opacity in the right thorax. CT chest/abd/pelv (+) concern for pulmonary empyema, pneumonia, reactive hilar lymphadenopathy, colonic ileus/enteritis. Admitted to hospitalist service.  09/10: IR plan chest tube placement tomorrow, consult to pulmonary (Dr Belia Heman alerted and will see pt).  9/11: Chest tube placed without complication. Patient doing well post-procedure. Remains on 2L [place] 9/12: patient was febrile overnight tmax 100.7. blood cultures remain negative. Culture from pleural sample NGTD. Fibrinolytic therapy.  9/13: patient feeling better today. Afebrile >24 hours. Culture positive for streptococcus constellatus. Fibrinolytic therapy with follow up CT chest today. Palliative meeting with patient and wife today.  9/14-9/16: culture sensitivities still pending. Chest CT and xrays reveal improvement in overall status of empyema. Continuing with saline pleural flushes to prevent clotting. Pulmonology continues to follow. Appreciate palliative involvement in ongoing discussions for disposition and GOC.  9/17: Confirmed with pulmonology and IR that current chest tube can remain in  place 4-6 weeks and will need to continue to have saline flushes to prevent clotting. ID consulted and recommends remaining on unasyn while in the hospital and transition to augmentin for total 4 week course when discharged. Likely continuously aspirating and perpetuating the infection. Breathing status has been stable on 3-4L [place]. Palliative and TOC are in discussions with long-term planning with family and patient.    Consultants:  Interventional radiology Pulmonology  Palliative ID   Procedures: Chest tube placement 9/11    ASSESSMENT & PLAN:  Principal Problem:   Acute hypoxic respiratory failure (HCC) Active Problems:   Lung mass   Hyponatremia   Leukocytosis   Stage IV decubitus ulcer (HCC)   Multiple sclerosis, primary progressive (HCC)   Flaccid neuropathic bladder, not elsewhere classified   Constipation   Acute hypoxic respiratory failure  lung mass/empyema, pneumonia  Continue supplemental oxygen to maintain oxygen saturation above 88%. Wean as tolerated but will likely require discharge with supplemental oxygen. Has been stable on 3-4L several days. Denies respiratory distress Chest PT Treat underlying causes as below Flutter valve, incentive spirometer    Lung abscess  empyema ID, IR and Pulmonology consulted Chest tube placed 9/11, plan for removal by IR today Outpatient follow up with pulmonology.  Pulmonary consult - chest tube management, fibrinolytic therapy completed 3 days.  Now on saline flushes.  Chest CT showing improvement in overall size.  Continue IV Abx x7 days ceftriaxone and azithromycin---> unasyn Transition to augmentin when discharged, continue IV while hospitalized.  Total 4 weeks treatment, per ID.   Anemia of chronic disease- hgb steady. 11.5>>>9.7. no sign of acute bleeding. No clear blood in pleural drainage Transfusion threshold 7 - monitor CBC   Hyponatremia- stable/improving. Na+ 131>>>134. PO intake has diminished prior to arrival  so likely dehydration related, ddx would include SIADH given pulmonary pathology   Monitor sodium   Constipation, concern for ileus- as seen on chest imaging. Had multiple small BMs since admission. States that for him, going 4 days in between BMs is normal. Continue home regimen, additional bowel regimen added Adding Enema prn   Flaccid neuropathic bladder, intermittent spasm Continue suprapubic catheter Also on oxybutynin for intermittent bladder spasm which causes urethral urine leakage    Multiple sclerosis, primary progressive (HCC) History of primary progressive MS, currently bedbound. Continue home oxycodone, baclofen and gabapentin At home takes "4 ibuprofen in the morning and 4 in the evening" and has for several years. Discussed the high risk of side effects of this regimen with patient and wife and recommended tylenol and trial of naproxen PRN Mobilize in bed / OOB to chair as able  Has been made DNI/DNR.  Palliative consulted.  New hospital bed ordered to facilitate pt care at home   Stage IV decubitus ulcer (HCC) Per patient's wife, patient has chronic ulcerations and HH visits to help manage.  routine care WOC consult    Concern for malnutrition/underweight based on BMI: Body mass index is 23.06 kg/m.   Hearing decrease- likely cerumen impaction - examine with otoscope - debrox drops - ear lavage   DVT prophylaxis: lovenox  Nutrition: regular diet Central lines / invasive devices: chronic suprapubic catheter, R chest tube   Code Status: DNR   TOC needs: HH RN, new hospital bed order  Barriers to dispo / significant pending items: need to have BM, chest tube removal today & monitor for stability of respiratory status for 24-48 hours   Subjective / Brief ROS:  Patient seen with wife at bedside this AM.  Pt reports feeling okay.  No BM yet despite escalated bowel regimen.  Discussed my conversation with pulmonology and interventional radiology about chest tube  removal after his CT scan this AM.  They are agreeable to plan.      Family Communication: wife at bedside on rounds   Objective Findings:  Vitals:   12/09/22 1625 12/10/22 0021 12/10/22 0749 12/10/22 1515  BP: 119/73 108/75 116/73 109/70  Pulse: 73 76 78 75  Resp: 17 16 18 18   Temp: 98.2 F (36.8 C) (!) 97 F (36.1 C) 98.2 F (36.8 C) 98.3 F (36.8 C)  TempSrc: Oral  Oral   SpO2: 96% 100% 99% 98%  Weight:      Height:        Intake/Output Summary (Last 24 hours) at 12/10/2022 1608 Last data filed at 12/10/2022 1308 Gross per 24 hour  Intake 2400 ml  Output 1300 ml  Net 1100 ml   Filed Weights   11/30/22 1910  Weight: 77.1 kg   Examination:  Physical Exam  General exam: awake, alert, no acute distress HEENT: moist mucus membranes, hearing grossly normal  Respiratory system: CTAB diminished right more than left base, no wheezes, coarse upper airway sounds. Chest tube on right Cardiovascular system: normal S1/S2, RRR, no pedal edema.   Gastrointestinal system: soft, NT, ND Central nervous system: A&O x 3. no gross focal neurologic deficits, normal speech Skin: dry, intact, normal temperature Psychiatry: normal mood, congruent affect, judgement and insight appear normal    Scheduled Medications:   baclofen  20 mg Oral BID WC   baclofen  40 mg Oral QHS   carbamide peroxide  5 drop Both EARS BID   docusate sodium  400 mg Oral QHS   enoxaparin (LOVENOX)  injection  40 mg Subcutaneous Q24H   gabapentin  1,200 mg Oral TID   [START ON 12/11/2022] influenza vaccine adjuvanted  0.5 mL Intramuscular Tomorrow-1000   loratadine  10 mg Oral Daily   magnesium oxide  400 mg Oral Daily   naloxegol oxalate  25 mg Oral Daily   naproxen  500 mg Oral BID WC   oxybutynin  5 mg Oral TID   oxyCODONE  10 mg Oral Q8H   [START ON 12/11/2022] pneumococcal 20-valent conjugate vaccine  0.5 mL Intramuscular Tomorrow-1000   polyethylene glycol  17 g Oral Daily   senna-docusate  1 tablet  Oral BID   sodium chloride flush  20 mL Intrapleural Q6H   sodium chloride flush  3 mL Intravenous Q12H   Continuous Infusions:  sodium chloride 10 mL/hr at 12/04/22 0503   ampicillin-sulbactam (UNASYN) IV 3 g (12/10/22 1200)   Antimicrobials from admission:  Anti-infectives (From admission, onward)    Start     Dose/Rate Route Frequency Ordered Stop   12/01/22 0015  Ampicillin-Sulbactam (UNASYN) 3 g in sodium chloride 0.9 % 100 mL IVPB        3 g 200 mL/hr over 30 Minutes Intravenous Every 6 hours 12/01/22 0010     11/30/22 2000  cefTRIAXone (ROCEPHIN) 1 g in sodium chloride 0.9 % 100 mL IVPB  Status:  Discontinued        1 g 200 mL/hr over 30 Minutes Intravenous Every 24 hours 11/30/22 1948 11/30/22 2335   11/30/22 2000  azithromycin (ZITHROMAX) 500 mg in sodium chloride 0.9 % 250 mL IVPB        500 mg 250 mL/hr over 60 Minutes Intravenous Every 24 hours 11/30/22 1948 12/04/22 2122   11/30/22 1945  levofloxacin (LEVAQUIN) IVPB 750 mg  Status:  Discontinued        750 mg 100 mL/hr over 90 Minutes Intravenous  Once 11/30/22 1935 11/30/22 1948       Data Reviewed:  I have personally reviewed the following...  CBC: Recent Labs  Lab 12/05/22 0510 12/06/22 0827 12/07/22 0530 12/08/22 0422 12/09/22 0311  WBC 6.9 4.3 4.8 5.6 6.2  HGB 9.6* 9.9* 9.8* 9.7* 10.0*  HCT 29.6* 31.6* 31.3* 30.8* 31.1*  MCV 90.8 91.9 92.6 91.9 91.7  PLT 367 432* 371 382 394   Basic Metabolic Panel: Recent Labs  Lab 12/05/22 0510 12/06/22 0827 12/07/22 0530 12/08/22 0422 12/09/22 0311  NA 132* 133* 133* 134* 134*  K 3.8 4.0 4.4 4.3 4.6  CL 97* 98 98 97* 95*  CO2 28 27 29  32 30  GLUCOSE 105* 99 113* 105* 108*  BUN 9 7* 8 9 12   CREATININE 0.44* 0.35* 0.47* 0.58* 0.56*  CALCIUM 8.0* 8.4* 8.2* 8.4* 8.4*      LOS: 9 days    Time spent: 45 min    Pennie Banter, DO [facility] 12/10/2022, 4:08 PM   If 7PM-7AM, please contact night coverage [email or web address]

## 2022-12-10 NOTE — TOC Progression Note (Signed)
Transition of Care [facility]) - Progression Note    Patient Details  Name: Ronald Santiago MRN: [id number] Date of Birth: [birth year]  [facility] CM/SW Contact  Marlowe Sax, RN Phone Number: 12/10/2022, 1:40 PM  Clinical Narrative:    [facility] called and stated that they spoke with the patient's wife and they do not want the semi electric bed, they have that at home already, she stated that she wanted a full electric bed and it was explained to her that medicare doe snot cover a full electric bed that the head and foot go up with electricity but the height of the bed would be controlled with a manual crank, she again stated that she did not wasn't the bed   Expected Discharge Plan: Home w Home Health Services Barriers to Discharge: Continued Medical Work up  Expected Discharge Plan and Services   Discharge Planning Services: CM Consult   Living arrangements for the past 2 months: Single Family Home                 DME Arranged: Hospital bed DME Agency: [facility] Date DME Agency Contacted: 12/09/22 Time DME Agency Contacted: [phone] Representative spoke with at DME Agency: Fayrene Fearing HH Arranged: PT, OT [facility] Agency: [facility] Date [facility] Agency Contacted: 12/08/22 Time HH Agency Contacted: [phone] Representative spoke with at [facility] Agency: [place]   Social Determinants of Health (SDOH) Interventions SDOH Screenings   Food Insecurity: Patient Unable To Answer (12/01/2022)  Housing: Patient Unable To Answer (12/01/2022)  Transportation Needs: Patient Unable To Answer (12/01/2022)  Utilities: Patient Unable To Answer (12/01/2022)  Financial Resource Strain: Patient Declined (10/28/2022)   Received from [facility]  Social Connections: Unknown (08/05/2021)   Received from [facility], [facility]  Tobacco Use: Low Risk  (12/01/2022)  Recent Concern: Tobacco Use - Medium Risk (10/29/2022)   Received from [facility]    Readmission Risk  Interventions     No data to display

## 2022-12-10 NOTE — TOC Progression Note (Signed)
Transition of Care [facility]) - Progression Note    Patient Details  Name: Ronald Santiago MRN: [id number] Date of Birth: 1946/02/05  Transition of Care [facility]) CM/SW Contact  Marlowe Sax, RN Phone Number: 12/10/2022, 8:16 AM  Clinical Narrative:    Spoke with [employer], and [employer] and then [facility] about getting the needed supplies to manage the Chest Tube at home, It was determined that the patient will be responsible for obtaining the Masks, gloves, and any other supplies on their own and the Flush syringes will need to have a prescription sent to the pharmacy of choice for the patient to be responsible for picking up. I notified the physician and treatment team.   Expected Discharge Plan: Home w Home Health Services Barriers to Discharge: Continued Medical Work up  Expected Discharge Plan and Services   Discharge Planning Services: CM Consult   Living arrangements for the past 2 months: Single Family Home                 DME Arranged: Hospital bed DME Agency: [facility] Date DME Agency Contacted: 12/09/22 Time DME Agency Contacted: [phone] Representative spoke with at DME Agency: Fayrene Fearing HH Arranged: PT, OT [facility] Agency: [facility] Date [facility] Agency Contacted: 12/08/22 Time HH Agency Contacted: [phone] Representative spoke with at [facility] Agency: [place]   Social Determinants of Health (SDOH) Interventions SDOH Screenings   Food Insecurity: Patient Unable To Answer (12/01/2022)  Housing: Patient Unable To Answer (12/01/2022)  Transportation Needs: Patient Unable To Answer (12/01/2022)  Utilities: Patient Unable To Answer (12/01/2022)  Financial Resource Strain: Patient Declined (10/28/2022)   Received from [facility]  Social Connections: Unknown (08/05/2021)   Received from [facility]  Tobacco Use: Low Risk  (12/01/2022)  Recent Concern: Tobacco Use - Medium Risk (10/29/2022)   Received from [facility]    Readmission Risk  Interventions     No data to display

## 2022-12-10 NOTE — Progress Notes (Signed)
Patient has Advanced MS and is Bed Bound, they have Chronic Decubitis Ulcers which requires upper and lower Body to be positioned in ways not feasible with a normal bed. Head must be elevated at least 30 degrees or they have trouble breathing and clearing secretions. Advanced MS and Chronic Decubiti frequently requires Frequent changes in body position which cannot be achieved with a normal bed.

## 2022-12-10 NOTE — Procedures (Signed)
Patient seen today for requested chest tube removal. Dr Belia Heman requested removal and imaging reviewed with Dr Loreta Ave who was in agreement with chest tube removal today. Patient was examined bedside. Dressing over right posterior chest tube was removed. Area was cleaned and sterilized appropriately. Suture was then cut and removed. Chest tube was clamped and cut distal to the clamp. Chest tube was then removed. An occlusive dressing was immediately placed over the site. Patient tolerated procedure well without immediate complication. Post-removal CXR ordered.  Ronald Francois, PA-C 12/10/2022 3:17 PM

## 2022-12-10 NOTE — Care Management Important Message (Signed)
Important Message  Patient Details  Name: Ronald Santiago MRN: [id number] Date of Birth: 1945/11/15   Medicare Important Message Given:  Yes     Olegario Messier A Carolos Fecher 12/10/2022, 11:34 AM

## 2022-12-10 NOTE — Progress Notes (Signed)
Date of Admission:  11/30/2022      ID: Edmund Whelihan is a 77 y.o. male  Principal Problem:   Acute hypoxic respiratory failure (HCC) Active Problems:   Stage IV decubitus ulcer (HCC)   Multiple sclerosis, primary progressive (HCC)   Flaccid neuropathic bladder, not elsewhere classified   Lung mass   Constipation   Hyponatremia   Leukocytosis   Community acquired pneumonia of right lung   Sepsis (HCC)   Empyema (HCC)   Multiple sclerosis (HCC)   Empyema lung (HCC)   Cough    Subjective: Doing better Chest drain removed this afternoon  Medications:   baclofen  20 mg Oral BID WC   baclofen  40 mg Oral QHS   carbamide peroxide  5 drop Both EARS BID   docusate sodium  400 mg Oral QHS   enoxaparin (LOVENOX) injection  40 mg Subcutaneous Q24H   gabapentin  1,200 mg Oral TID   [START ON 12/11/2022] influenza vaccine adjuvanted  0.5 mL Intramuscular Tomorrow-1000   loratadine  10 mg Oral Daily   magnesium oxide  400 mg Oral Daily   naloxegol oxalate  25 mg Oral Daily   naproxen  500 mg Oral BID WC   oxybutynin  5 mg Oral TID   oxyCODONE  10 mg Oral Q8H   [START ON 12/11/2022] pneumococcal 20-valent conjugate vaccine  0.5 mL Intramuscular Tomorrow-1000   polyethylene glycol  17 g Oral Daily   senna-docusate  1 tablet Oral BID   sodium chloride flush  20 mL Intrapleural Q6H   sodium chloride flush  3 mL Intravenous Q12H    Objective: Vital signs in last 24 hours: Patient Vitals for the past 24 hrs:  BP Temp Temp src Pulse Resp SpO2  12/10/22 1515 109/70 98.3 F (36.8 C) -- 75 18 98 %  12/10/22 0749 116/73 98.2 F (36.8 C) Oral 78 18 99 %  12/10/22 0021 108/75 (!) 97 F (36.1 C) -- 76 16 100 %      PHYSICAL EXAM:  General: Alert, cooperative, no distress, frail Lungs: b/l air entry Heart: Regular rate and rhythm, no murmur, rub or gallop. Abdomen: Soft, SPC  Extremities: atraumatic, no cyanosis. No edema. No clubbing Skin: No rashes or lesions. Or bruising Lymph:  Cervical, supraclavicular normal. Neurologic: weakness legs  Lab Results    Latest Ref Rng & Units 12/09/2022    3:11 AM 12/08/2022    4:22 AM 12/07/2022    5:30 AM  CBC  WBC 4.0 - 10.5 K/uL 6.2  5.6  4.8   Hemoglobin 13.0 - 17.0 g/dL 16.1  9.7  9.8   Hematocrit 39.0 - 52.0 % 31.1  30.8  31.3   Platelets 150 - 400 K/uL 394  382  371        Latest Ref Rng & Units 12/09/2022    3:11 AM 12/08/2022    4:22 AM 12/07/2022    5:30 AM  CMP  Glucose 70 - 99 mg/dL 096  045  409   BUN 8 - 23 mg/dL 12  9  8    Creatinine 0.61 - 1.24 mg/dL 8.11  9.14  7.82   Sodium 135 - 145 mmol/L 134  134  133   Potassium 3.5 - 5.1 mmol/L 4.6  4.3  4.4   Chloride 98 - 111 mmol/L 95  97  98   CO2 22 - 32 mmol/L 30  32  29   Calcium 8.9 - 10.3 mg/dL 8.4  8.4  8.2       Microbiology:  Studies/Results: DG Chest Port 1 View  Result Date: 12/10/2022 CLINICAL DATA:  Right chest tube removal EXAM: PORTABLE CHEST 1 VIEW COMPARISON:  12/09/2022 FINDINGS: Single frontal view of the chest demonstrates a stable cardiac silhouette. Interval removal of the right-sided pigtail drainage catheter. No evidence of effusion or pneumothorax. Stable right upper lobe airspace disease consistent with bronchopneumonia. Left chest is clear. No acute bony abnormalities. IMPRESSION: 1. No evidence of recurrent right effusion or pneumothorax after chest tube removal. 2. Persistent patchy right upper lobe bronchopneumonia. Electronically Signed   By: Sharlet Salina M.D.   On: 12/10/2022 17:10   CT CHEST WO CONTRAST  Result Date: 12/10/2022 CLINICAL DATA:  Chest pain and weakness. Empyema with chest tube in place EXAM: CT CHEST WITHOUT CONTRAST TECHNIQUE: Multidetector CT imaging of the chest was performed following the standard protocol without IV contrast. RADIATION DOSE REDUCTION: This exam was performed according to the departmental dose-optimization program which includes automated exposure control, adjustment of the mA and/or kV  according to patient size and/or use of iterative reconstruction technique. COMPARISON:  12/05/2022 FINDINGS: Cardiovascular: Normal heart size. Trace pericardial effusion, decreased. Thoracic aorta is nonaneurysmal. Scattered atherosclerotic vascular calcifications of the aorta and coronary arteries. Central pulmonary vasculature is nondilated. Mediastinum/Nodes: No enlarging axillary or mediastinal lymph nodes. Known right hilar adenopathy is suboptimally assessed in the absence of intravenous contrast, but does not appear significantly changed compared to the recent previous CT. Trachea and esophagus within normal limits. Lungs/Pleura: Right-sided pigtail pleural drainage catheter remains in place at the posterior aspect of the lower right hemithorax. Interval decrease in size of known right sided empyema with thick-walled collection now measuring approximately 6.1 x 0.9 x 16.2 cm (previously 8.2 x 3.1 x 17.6 cm). The fluid content within this collection has nearly resolved with increasing air component. Overlying right basilar atelectasis/consolidation is similar to slightly improved. Patchy tree-in-bud nodularity and ground-glass opacity throughout the right upper lobe, slightly progressed. Similar mild left basilar atelectasis. No left-sided pleural effusion. Upper Abdomen: Partially imaged hepatic and splenic cysts. No new or acute abnormalities within the included upper abdomen. Musculoskeletal: No chest wall mass or suspicious bone lesions identified. IMPRESSION: 1. Interval decrease in size of known right-sided empyema with pigtail pleural drainage catheter in place. The fluid content within this collection has nearly resolved with increasing air component. 2. Overlying right basilar atelectasis/consolidation is similar to slightly improved. 3. Patchy tree-in-bud nodularity and ground-glass opacity throughout the right upper lobe, slightly progressed. Findings are favored to represent an  infectious/inflammatory process. 4. Aortic and coronary artery atherosclerosis (ICD10-I70.0). Electronically Signed   By: Duanne Guess D.O.   On: 12/10/2022 13:06   DG Chest Port 1 View  Result Date: 12/09/2022 CLINICAL DATA:  [id number] Chest tube in place [id number] EXAM: PORTABLE CHEST 1 VIEW COMPARISON:  December 06, 2022 FINDINGS: The cardiomediastinal silhouette is unchanged in contour.RIGHT chest tube. Similar pleural thickening ening of the RIGHT apex. No significant pneumothorax. Persistent RIGHT paramediastinal opacity. IMPRESSION: Similar appearance of RIGHT chest tube. No significant pneumothorax. Electronically Signed   By: Meda Klinefelter M.D.   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  Discussed the management with patient and his wife ID will sign off Call if needed Pt to follow up with pulmonologist as OP

## 2022-12-10 NOTE — TOC Progression Note (Signed)
Transition of Care [facility]) - Progression Note    Patient Details  Name: Ronald Santiago MRN: [id number] Date of Birth: 1946-01-18  Transition of Care [facility]) CM/SW Contact  Marlowe Sax, RN Phone Number: 12/10/2022, 2:24 PM  Clinical Narrative:     Met with the patient in the room, they may need home oxygen but are attempting to be weened off TOC to continue to follow for needs  Expected Discharge Plan: Home w Home Health Services Barriers to Discharge: Continued Medical Work up  Expected Discharge Plan and Services   Discharge Planning Services: CM Consult   Living arrangements for the past 2 months: Single Family Home                 DME Arranged: Hospital bed DME Agency: [facility] Date DME Agency Contacted: 12/09/22 Time DME Agency Contacted: [phone] Representative spoke with at DME Agency: Fayrene Fearing HH Arranged: PT, OT [facility] Agency: [facility] Date [facility] Agency Contacted: 12/08/22 Time HH Agency Contacted: [phone] Representative spoke with at [facility] Agency: [place]   Social Determinants of Health (SDOH) Interventions SDOH Screenings   Food Insecurity: Patient Unable To Answer (12/01/2022)  Housing: Patient Unable To Answer (12/01/2022)  Transportation Needs: Patient Unable To Answer (12/01/2022)  Utilities: Patient Unable To Answer (12/01/2022)  Financial Resource Strain: Patient Declined (10/28/2022)   Received from [facility]  Social Connections: Unknown (08/05/2021)   Received from [facility]  Tobacco Use: Low Risk  (12/01/2022)  Recent Concern: Tobacco Use - Medium Risk (10/29/2022)   Received from [facility]    Readmission Risk Interventions     No data to display

## 2022-12-11 DIAGNOSIS — J9601 Acute respiratory failure with hypoxia: Secondary | ICD-10-CM | POA: Diagnosis not present

## 2022-12-11 LAB — SUSCEPTIBILITY, AER + ANAEROB

## 2022-12-11 LAB — SUSCEPTIBILITY RESULT

## 2022-12-11 NOTE — Plan of Care (Signed)
  Problem: Clinical Measurements: Goal: Ability to maintain clinical measurements within normal limits will improve Outcome: Progressing Goal: Will remain free from infection Outcome: Progressing Goal: Diagnostic test results will improve Outcome: Progressing Goal: Respiratory complications will improve Outcome: Progressing Goal: Cardiovascular complication will be avoided Outcome: Progressing   Problem: Nutrition: Goal: Adequate nutrition will be maintained Outcome: Progressing   Problem: Coping: Goal: Level of anxiety will decrease Outcome: Progressing   Problem: Safety: Goal: Ability to remain free from injury will improve Outcome: Progressing

## 2022-12-11 NOTE — Plan of Care (Signed)
Problem: Education: Goal: Knowledge of General Education information will improve Description: Including pain rating scale, medication(s)/side effects and non-pharmacologic comfort measures Outcome: Progressing   Problem: Health Behavior/Discharge Planning: Goal: Ability to manage health-related needs will improve Outcome: Progressing   Problem: Clinical Measurements: Goal: Ability to maintain clinical measurements within normal limits will improve Outcome: Progressing Goal: Will remain free from infection Outcome: Progressing Goal: Diagnostic test results will improve Outcome: Progressing Goal: Respiratory complications will improve Outcome: Progressing Goal: Cardiovascular complication will be avoided Outcome: Progressing   Problem: Elimination: Goal: Will not experience complications related to bowel motility Outcome: Progressing Goal: Will not experience complications related to urinary retention Outcome: Progressing   Problem: Safety: Goal: Ability to remain free from injury will improve Outcome: Progressing   Problem: Skin Integrity: Goal: Risk for impaired skin integrity will decrease Outcome: Progressing

## 2022-12-11 NOTE — Progress Notes (Addendum)
PROGRESS NOTE  Ronald Santiago   NUU:[id number] DOB: 15-Jul-1945  DOA: 11/30/2022 Date of Service: 12/11/22 PCP: Mick Sell, MD  Brief Narrative / Hospital Course:  Ronald Santiago is a 77 y.o. 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 Few courses of abx since 08/2022 for presumed PNA/bronchitis.CXR 11/06/22 (+)RLL infiltrate. 11/27/22 SpO2 87% w/ home PT but pt refused to go to ED>  09/09: to ED. SpO2 80s improved on 3L [place] to 96%. WBC 16.8, sodium 128, Chest x-ray was obtained that demonstrated a large ovoid opacity in the right thorax. CT chest/abd/pelv (+) concern for pulmonary empyema, pneumonia, reactive hilar lymphadenopathy, colonic ileus/enteritis. Admitted to hospitalist service.  09/10: IR plan chest tube placement tomorrow, consult to pulmonary (Dr Belia Heman alerted and will see pt).  9/11: Chest tube placed without complication. Patient doing well post-procedure. Remains on 2L [place] 9/12: patient was febrile overnight tmax 100.7. blood cultures remain negative. Culture from pleural sample NGTD. Fibrinolytic therapy.  9/13: patient feeling better today. Afebrile >24 hours. Culture positive for streptococcus constellatus. Fibrinolytic therapy with follow up CT chest today. Palliative meeting with patient and wife today.  9/14-9/16: culture sensitivities still pending. Chest CT and xrays reveal improvement in overall status of empyema. Continuing with saline pleural flushes to prevent clotting. Pulmonology continues to follow. Appreciate palliative involvement in ongoing discussions for disposition and GOC.  9/17: Confirmed with pulmonology and IR that current chest tube can remain in  place 4-6 weeks and will need to continue to have saline flushes to prevent clotting. ID consulted and recommends remaining on unasyn while in the hospital and transition to augmentin for total 4 week course when discharged. Likely continuously aspirating and perpetuating the infection. Breathing status has been stable on 3-4L [place]. Palliative and TOC are in discussions with long-term planning with family and patient.  9/19: chest tube removed.  On 1-2 L [place] O2. 9/20: no BM yet despite increased bowel regimen.  Weaning off oxygen, sats in upper 90's on 1 L/ min   Consultants:  Interventional radiology Pulmonology  Palliative ID   Procedures: Chest tube placement 9/11    ASSESSMENT & PLAN:  Principal Problem:   Acute hypoxic respiratory failure (HCC) Active Problems:   Lung mass   Hyponatremia   Leukocytosis   Stage IV decubitus ulcer (HCC)   Multiple sclerosis, primary progressive (HCC)   Flaccid neuropathic bladder, not elsewhere classified   Constipation   Acute hypoxic respiratory failure  lung mass/empyema, pneumonia  Continue supplemental oxygen to maintain oxygen saturation above 88%. Wean as tolerated but will likely require discharge with supplemental oxygen. Has been stable on 3-4L several days. Denies respiratory distress Chest PT Treat underlying causes as below Flutter valve, incentive spirometer    Lung abscess  empyema ID, IR and Pulmonology consulted Chest tube placed 9/11, removed on 9/19 Outpatient follow up with pulmonology.  Pulmonary consult - chest tube management, fibrinolytic therapy completed 3 days.  Continue IV Abx x7 days ceftriaxone and azithromycin---> unasyn Transition to augmentin when discharged, continue IV while hospitalized.  Total 4 weeks treatment, per ID.   Anemia of chronic disease- hgb steady. 11.5>>>9.7. no sign of acute bleeding. No clear blood in pleural drainage Transfusion threshold 7 - monitor CBC  Hyponatremia-  stable/improving. Na+ 131>>>134. PO intake has diminished prior to arrival so likely dehydration related, ddx would include SIADH given pulmonary pathology   Monitor sodium   Constipation, concern for ileus- as seen on chest imaging. Had multiple small BMs since admission. States that for him, going 4 days in between BMs is normal. Continue home regimen, additional bowel regimen added Adding Enema prn   Flaccid neuropathic bladder, intermittent spasm Continue suprapubic catheter Also on oxybutynin for intermittent bladder spasm which causes urethral urine leakage    Multiple sclerosis, primary progressive (HCC) History of primary progressive MS, currently bedbound. Continue home oxycodone, baclofen and gabapentin At home takes "4 ibuprofen in the morning and 4 in the evening" and has for several years. Discussed the high risk of side effects of this regimen with patient and wife and recommended tylenol and trial of naproxen PRN Mobilize in bed / OOB to chair as able  Has been made DNI/DNR.  Palliative consulted.  New hospital bed ordered to facilitate pt care at home   Stage IV decubitus ulcer (HCC) Per patient's wife, patient has chronic ulcerations and HH visits to help manage.  routine care WOC consult    Concern for malnutrition/underweight based on BMI: Body mass index is 23.06 kg/m.   Hearing decrease- likely cerumen impaction - examine with otoscope - debrox drops - ear lavage   DVT prophylaxis: lovenox  Nutrition: regular diet Central lines / invasive devices: chronic suprapubic catheter, R chest tube   Code Status: DNR   Barriers to dispo / significant pending items: need to have BM, chest tube removal today & monitor for stability of respiratory status for 24-48 hours   Subjective / Brief ROS:  Patient seen today, wife at bedside.  Pt denies any change in breathing since chest tube removed yesterday. No fever or chills.  Still no bowel movement.  No other acute  complaints. Hopes to go home tomorrow.    Family Communication: wife at updated at bedside on rounds   Objective Findings:  Vitals:   12/10/22 0749 12/10/22 1515 12/10/22 2203 12/11/22 0855  BP: 116/73 109/70 (!) 115/50 128/82  Pulse: 78 75 72 (!) 56  Resp: 18 18 16 17   Temp: 98.2 F (36.8 C) 98.3 F (36.8 C) 97.7 F (36.5 C) 97.6 F (36.4 C)  TempSrc: Oral     SpO2: 99% 98% 97% 97%  Weight:      Height:        Intake/Output Summary (Last 24 hours) at 12/11/2022 1450 Last data filed at 12/11/2022 1029 Gross per 24 hour  Intake 540 ml  Output 800 ml  Net -260 ml   Filed Weights   11/30/22 1910  Weight: 77.1 kg   Examination:  Physical Exam  General exam: awake, alert, no acute distress HEENT: moist mucus membranes, hearing grossly normal  Respiratory system: on 1 L/min [place] O2, normal respiratory effort. Cardiovascular system: normal S1/S2, RRR,  no pedal edema.   Gastrointestinal system: soft, NT, ND, no HSM felt, +bowel sounds. Central nervous system: A&O x 3. no gross focal neurologic deficits, normal speech Skin: dry, intact, normal temperature Psychiatry: normal mood, congruent affect, judgement and insight appear normal     Scheduled Medications:   baclofen  20 mg Oral BID WC   baclofen  40 mg Oral QHS   carbamide peroxide  5 drop Both EARS BID   docusate sodium  400 mg Oral QHS   enoxaparin (LOVENOX) injection  40 mg Subcutaneous Q24H  gabapentin  1,200 mg Oral TID   influenza vaccine adjuvanted  0.5 mL Intramuscular Tomorrow-1000   loratadine  10 mg Oral Daily   magnesium oxide  400 mg Oral Daily   naloxegol oxalate  25 mg Oral Daily   naproxen  500 mg Oral BID WC   oxybutynin  5 mg Oral TID   oxyCODONE  10 mg Oral Q8H   polyethylene glycol  17 g Oral Daily   senna-docusate  1 tablet Oral BID   sodium chloride flush  3 mL Intravenous Q12H   Continuous Infusions:  sodium chloride 10 mL/hr at 12/04/22 0503   ampicillin-sulbactam (UNASYN) IV 3 g  (12/11/22 1151)   Antimicrobials from admission:  Anti-infectives (From admission, onward)    Start     Dose/Rate Route Frequency Ordered Stop   12/01/22 0015  Ampicillin-Sulbactam (UNASYN) 3 g in sodium chloride 0.9 % 100 mL IVPB        3 g 200 mL/hr over 30 Minutes Intravenous Every 6 hours 12/01/22 0010     11/30/22 2000  cefTRIAXone (ROCEPHIN) 1 g in sodium chloride 0.9 % 100 mL IVPB  Status:  Discontinued        1 g 200 mL/hr over 30 Minutes Intravenous Every 24 hours 11/30/22 1948 11/30/22 2335   11/30/22 2000  azithromycin (ZITHROMAX) 500 mg in sodium chloride 0.9 % 250 mL IVPB        500 mg 250 mL/hr over 60 Minutes Intravenous Every 24 hours 11/30/22 1948 12/04/22 2122   11/30/22 1945  levofloxacin (LEVAQUIN) IVPB 750 mg  Status:  Discontinued        750 mg 100 mL/hr over 90 Minutes Intravenous  Once 11/30/22 1935 11/30/22 1948       Data Reviewed:  I have personally reviewed the following...  CBC: Recent Labs  Lab 12/05/22 0510 12/06/22 0827 12/07/22 0530 12/08/22 0422 12/09/22 0311  WBC 6.9 4.3 4.8 5.6 6.2  HGB 9.6* 9.9* 9.8* 9.7* 10.0*  HCT 29.6* 31.6* 31.3* 30.8* 31.1*  MCV 90.8 91.9 92.6 91.9 91.7  PLT 367 432* 371 382 394   Basic Metabolic Panel: Recent Labs  Lab 12/05/22 0510 12/06/22 0827 12/07/22 0530 12/08/22 0422 12/09/22 0311  NA 132* 133* 133* 134* 134*  K 3.8 4.0 4.4 4.3 4.6  CL 97* 98 98 97* 95*  CO2 28 27 29  32 30  GLUCOSE 105* 99 113* 105* 108*  BUN 9 7* 8 9 12   CREATININE 0.44* 0.35* 0.47* 0.58* 0.56*  CALCIUM 8.0* 8.4* 8.2* 8.4* 8.4*      LOS: 10 days    Time spent: 35 min    Pennie Banter, DO [facility] 12/11/2022, 2:50 PM   If 7PM-7AM, please contact night coverage [email or web address]

## 2022-12-12 DIAGNOSIS — J9601 Acute respiratory failure with hypoxia: Secondary | ICD-10-CM | POA: Diagnosis not present

## 2022-12-12 LAB — CBC
HCT: 33.1 % — ABNORMAL LOW (ref 39.0–52.0)
Hemoglobin: 10.5 g/dL — ABNORMAL LOW (ref 13.0–17.0)
MCH: 28.6 pg (ref 26.0–34.0)
MCHC: 31.7 g/dL (ref 30.0–36.0)
MCV: 90.2 fL (ref 80.0–100.0)
Platelets: 375 10*3/uL (ref 150–400)
RBC: 3.67 MIL/uL — ABNORMAL LOW (ref 4.22–5.81)
RDW: 14.7 % (ref 11.5–15.5)
WBC: 7.6 10*3/uL (ref 4.0–10.5)
nRBC: 0 % (ref 0.0–0.2)

## 2022-12-12 MED ORDER — SENNOSIDES-DOCUSATE SODIUM 8.6-50 MG PO TABS: 1.0000 | ORAL_TABLET | Freq: Two times a day (BID) | ORAL | 0 refills | Status: DC

## 2022-12-12 MED ORDER — CARBAMIDE PEROXIDE 6.5 % OT SOLN: 5.0000 [drp] | Freq: Two times a day (BID) | OTIC | 0 refills | Status: DC

## 2022-12-12 MED ORDER — POLYETHYLENE GLYCOL 3350 17 G PO PACK: 17.0000 g | PACK | Freq: Every day | ORAL | 0 refills | Status: DC

## 2022-12-12 MED ORDER — HYDROCOD POLI-CHLORPHE POLI ER 10-8 MG/5ML PO SUER: 5.0000 mL | Freq: Two times a day (BID) | ORAL | 0 refills | Status: DC | PRN

## 2022-12-12 MED ORDER — AMOXICILLIN-POT CLAVULANATE 875-125 MG PO TABS
1.0000 | ORAL_TABLET | Freq: Two times a day (BID) | ORAL | Status: DC
  Administered 2022-12-12 – 2022-12-14 (×4): 1 via ORAL
  Filled 2022-12-12 (×4): qty 1

## 2022-12-12 MED ORDER — AMOXICILLIN-POT CLAVULANATE 875-125 MG PO TABS: 1.0000 | ORAL_TABLET | Freq: Two times a day (BID) | ORAL | 0 refills | Status: AC

## 2022-12-12 NOTE — Discharge Summary (Signed)
Physician Discharge Summary   Patient: Ronald Santiago MRN: [id number] DOB: 1946/02/23  Admit date:     11/30/2022  Discharge date: {dischdate:26783}  Discharge Physician: Pennie Banter   PCP: Mick Sell, MD   Recommendations at discharge:  {Tip this will not be part of the note when signed- Example include specific recommendations for outpatient follow-up, pending tests to follow-up on. (Optional):26781}  ***  Discharge Diagnoses: Principal Problem:   Acute hypoxic respiratory failure (HCC) Active Problems:   Lung mass   Hyponatremia   Leukocytosis   Stage IV decubitus ulcer (HCC)   Multiple sclerosis, primary progressive (HCC)   Flaccid neuropathic bladder, not elsewhere classified   Constipation   Community acquired pneumonia of right lung   Sepsis (HCC)   Empyema (HCC)   Multiple sclerosis (HCC)   Empyema lung (HCC)   Cough  Resolved Problems:   * No resolved hospital problems. *  Hospital Course: Ronald Santiago is a 77 y.o. 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 Few courses of abx since 08/2022 for presumed PNA/bronchitis.CXR 11/06/22 (+)RLL infiltrate. 11/27/22 SpO2 87% w/ home PT but pt refused to go to ED>  09/09: to ED. SpO2 80s improved on 3L [place] to 96%. WBC 16.8, sodium 128, Chest x-ray was obtained that demonstrated a large ovoid opacity in the right thorax. CT chest/abd/pelv (+) concern for pulmonary empyema, pneumonia, reactive hilar lymphadenopathy, colonic ileus/enteritis. Admitted to hospitalist service.  09/10: IR plan chest tube placement tomorrow, consult to pulmonary (Dr Belia Heman alerted and will see pt).    Consultants:  Interventional  radiology Pulmonology   Procedures: none    ASSESSMENT & PLAN:   Principal Problem:   Acute hypoxic respiratory failure (HCC) Active Problems:   Lung mass   Hyponatremia   Leukocytosis   Stage IV decubitus ulcer (HCC)   Multiple sclerosis, primary progressive (HCC)   Flaccid neuropathic bladder, not elsewhere classified   Constipation   Acute hypoxic respiratory failure Likely d/t large lung mass/empyema, pneumonia  Continue supplemental oxygen to maintain oxygen saturation above 88%. Wean as tolerated but will likely require discharge with supplemental oxygen Treat underlying causes as below    Lung mass w/ concern for empyema Ddx: postobstructive pneumonia potentially in the setting of abscess (high aspiration risk in the setting of MS) versus bronchogenic carcinoma. Month-long fever and decreased appetite could be caused by either.  Note: Although patient met SIRS criteria with respiratory rate of 20 and heart rate of 109, there is no evidence of life-threatening organ dysfunction to suggest sepsis (per 2016 SEPSIS-3 guidelines).  Telemetry monitoring IR consult - eval for thoracentesis vs chest tube vs other management --> plan chest tube placement tomorrow Pulmonary consult - chest tube management, also if concern for obstructive process may need bronch Continue ceftriaxone and azithromycin Sputum culture  Strep pneumo and Legionella urinary antigens   Hyponatremia Mild hyponatremia of 128 noted. 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 Patient remains afebrile at this time.  WBC downtrending Abx as above  Blood cultures pending Monitor CBC    Constipation, concern for ileus  On multiple daily laxatives and motility agents with only partial results. He usually has a bowel movement every 4-5 days. His last BM was 5 days ago. No N/V reported at this time, nor distention on examination to suggest SBO.  Continue home regimen Adding Enema prn No concern for obstruction at this poitn but low threshold for surgical consult / NG placement if worsening    Flaccid neuropathic bladder, not elsewhere classified Continue suprapubic catheter   Multiple sclerosis, primary progressive (HCC) History of primary progressive MS, currently bedbound. Continue home oxycodone, baclofen and gabapentin Mobilize in bed / OOB to chair as able    Stage IV decubitus ulcer (HCC) Per patient's wife, patient has chronic ulcerations and HH visits to help manage.   routine care, will attempt to get photo but pt reports severe pain w/ repositioning     Concern for malnutrition/underweight based on BMI: Body mass index is 23.06 kg/m.   DVT prophylaxis: lovenox  IV fluids: LR continuous IV fluids  Nutrition: regular diet Central lines / invasive devices: chronic suprapubic catheter   Code Status: FULL CODE, per H&P 11/30/22 Dr Huel Cote: "patient states that at this point, he would want a short-term trial of resuscitative efforts in the event of cardiac or pulmonary arrest. His wife at bedside states that they have signed a advanced directive and notes that he would not want to be on long-term life support. We discussed that if patient is not responding to life-sustaining measures, they can always transition to comfort measures when they feel ready."  ACP documentation reviewed: none on file in [facility]  TOC needs: none at this time Barriers to dispo / significant pending items: clinical improvement, address lung mass, await cultures, expect will be here few days       Assessment and Plan: * Acute hypoxic respiratory failure (HCC) Patient is presenting  with acute hypoxic respiratory failure in the setting of 1 month of cough that has not improved after multiple rounds of antibiotics.  Chest x-ray with large ovoid mass in the right thorax.  Please see below for further details regarding workup.  Suspect hypoxia secondary to large portion of lung now being occupied.  - Continue supplemental oxygen to maintain oxygen saturation above 88% - Wean as tolerated - Patient will likely require discharge with supplemental oxygen  Lung mass Patient is presenting with 1 month of cough with previous right lower lobe opacity noted now with enlarging oval opacity involving the right lower and middle lung fields.  Given he has not responded to antibiotics, differential includes a postobstructive pneumonia potentially in the setting of abscess (high aspiration risk in the setting of MS) versus bronchogenic carcinoma. Month-long fever and decreased appetite could be caused by either. Personal review of CT demonstrates a large 10 x 10 x 20 cm mass with thick walls. No central cavitation or gas.   Although patient met SIRS criteria with respiratory rate of 20 and heart rate of 109, there is no evidence of life-threatening organ dysfunction to suggest sepsis (per 2016 SEPSIS-3 guidelines).   - Telemetry monitoring - CT chest with contrast completed - radiology reading pending - Consider IR biopsy vs drainage give mass abuts the chest wall - Continue ceftriaxone and azithromycin - Sputum culture if able to be obtained - Strep pneumo and Legionella urinary antigens  Hyponatremia Mild hyponatremia of 128 noted. PO intake has diminished over the past 1 week, so likely dehydration related.   - S/p 1 L bolus - Continue maintenance fluids overnight - Recheck sodium in the AM  Leukocytosis Likely reactive due to lung mass, which may be an abscess. Patient remains afebrile at this time.   - Repeat CBC in the AM - Blood cultures pending  Constipation Mrs. Nolley  states patient is on multiple daily laxatives and motility agents with only partial results. He usually has a bowel movement every 4-5 days. His last BM was 5 days ago. No N/V reported at this time, nor distention on examination to suggest SBO.   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  - Continue home oxycodone, baclofen and gabapentin  Stage IV decubitus ulcer (HCC) Per patient's wife, patient has chronic ulcerations and HH visits to help manage.   - Wound care consult placed      {Tip this will not be part of the note when signed Body mass index is 23.06 kg/m. , ,  Active Pressure Injury/Wound(s)     Pressure Ulcer  Duration          Pressure Injury 12/01/22 Pretibial Left;Lateral Stage 2 -  Partial thickness loss of dermis presenting as a shallow open injury with a red, pink wound bed without slough. 2 areas of full thickness wounds, NOT a pressure injury 10 days   Pressure Injury 12/01/22 Pretibial Right;Lateral Stage 1 -  Intact skin with non-blanchable redness of a localized area usually over a bony prominence. partial thickness wound, NOT a pressure injury 10 days           (Optional):26781}  {(NOTE) Pain control PDMP Statment (Optional):26782} Consultants: *** Procedures performed: ***  Disposition: {Plan; Disposition:26390} Diet recommendation:  {Diet_Plan:26776} DISCHARGE MEDICATION: Allergies as of 12/12/2022       Reactions   Cephalexin Anaphylaxis, Hives   Pt tolerated several doses of cefepime during hospitalization in 03/2021     Med Rec must be completed prior to using this [facility]***        Durable Medical Equipment  (From admission, onward)           Start     Ordered   12/03/22 1304  For home use only DME Hospital bed  Once       Question Answer Comment  Length  of Need Lifetime   Patient has (list medical condition): advanced MS, sacral wounds   The above medical condition requires: Patient requires the ability to reposition frequently   Bed type Semi-electric   Hoyer Lift Yes   Support Surface: Alternating Pressure Pad and Pump      12/03/22 1305            Discharge Exam: Ceasar Mons Weights   11/30/22 1910  Weight: 77.1 kg   ***  Condition at discharge: {DC Condition:26389}  The results of significant diagnostics from this hospitalization (including imaging, microbiology, ancillary and laboratory) are listed below for reference.   Imaging Studies: DG Chest Port 1 View  Result Date: 12/10/2022 CLINICAL DATA:  Right chest tube removal EXAM: PORTABLE CHEST 1 VIEW COMPARISON:  12/09/2022 FINDINGS: Single frontal view of the chest demonstrates a stable cardiac silhouette. Interval removal of the right-sided pigtail drainage catheter. No evidence of effusion or pneumothorax. Stable right upper lobe airspace disease consistent with bronchopneumonia. Left  chest is clear. No acute bony abnormalities. IMPRESSION: 1. No evidence of recurrent right effusion or pneumothorax after chest tube removal. 2. Persistent patchy right upper lobe bronchopneumonia. Electronically Signed   By: Sharlet Salina M.D.   On: 12/10/2022 17:10   CT CHEST WO CONTRAST  Result Date: 12/10/2022 CLINICAL DATA:  Chest pain and weakness. Empyema with chest tube in place EXAM: CT CHEST WITHOUT CONTRAST TECHNIQUE: Multidetector CT imaging of the chest was performed following the standard protocol without IV contrast. RADIATION DOSE REDUCTION: This exam was performed according to the departmental dose-optimization program which includes automated exposure control, adjustment of the mA and/or kV according to patient size and/or use of iterative reconstruction technique. COMPARISON:  12/05/2022 FINDINGS: Cardiovascular: Normal heart size. Trace pericardial effusion, decreased. Thoracic  aorta is nonaneurysmal. Scattered atherosclerotic vascular calcifications of the aorta and coronary arteries. Central pulmonary vasculature is nondilated. Mediastinum/Nodes: No enlarging axillary or mediastinal lymph nodes. Known right hilar adenopathy is suboptimally assessed in the absence of intravenous contrast, but does not appear significantly changed compared to the recent previous CT. Trachea and esophagus within normal limits. Lungs/Pleura: Right-sided pigtail pleural drainage catheter remains in place at the posterior aspect of the lower right hemithorax. Interval decrease in size of known right sided empyema with thick-walled collection now measuring approximately 6.1 x 0.9 x 16.2 cm (previously 8.2 x 3.1 x 17.6 cm). The fluid content within this collection has nearly resolved with increasing air component. Overlying right basilar atelectasis/consolidation is similar to slightly improved. Patchy tree-in-bud nodularity and ground-glass opacity throughout the right upper lobe, slightly progressed. Similar mild left basilar atelectasis. No left-sided pleural effusion. Upper Abdomen: Partially imaged hepatic and splenic cysts. No new or acute abnormalities within the included upper abdomen. Musculoskeletal: No chest wall mass or suspicious bone lesions identified. IMPRESSION: 1. Interval decrease in size of known right-sided empyema with pigtail pleural drainage catheter in place. The fluid content within this collection has nearly resolved with increasing air component. 2. Overlying right basilar atelectasis/consolidation is similar to slightly improved. 3. Patchy tree-in-bud nodularity and ground-glass opacity throughout the right upper lobe, slightly progressed. Findings are favored to represent an infectious/inflammatory process. 4. Aortic and coronary artery atherosclerosis (ICD10-I70.0). Electronically Signed   By: Duanne Guess D.O.   On: 12/10/2022 13:06   DG Chest Port 1 View  Result Date:  12/09/2022 CLINICAL DATA:  [id number] Chest tube in place [id number] EXAM: PORTABLE CHEST 1 VIEW COMPARISON:  December 06, 2022 FINDINGS: The cardiomediastinal silhouette is unchanged in contour.RIGHT chest tube. Similar pleural thickening ening of the RIGHT apex. No significant pneumothorax. Persistent RIGHT paramediastinal opacity. IMPRESSION: Similar appearance of RIGHT chest tube. No significant pneumothorax. Electronically Signed   By: Meda Klinefelter M.D.   On: 12/09/2022 09:05   DG Chest 2 View  Result Date: 12/06/2022 CLINICAL DATA:  Empyema EXAM: CHEST - 2 VIEW COMPARISON:  the previous day's study FINDINGS: Stable pigtail drain catheter posterior right hemithorax. No pneumothorax. No significant pleural effusion. Hazy opacities in the right mid and lower lung stable. Left lung clear. Heart size and mediastinal contours are within normal limits. Visualized bones unremarkable. IMPRESSION: 1. Stable right pigtail drain catheter. No pneumothorax or significant pleural effusion. 2. Stable right mid and lower lung opacities. Electronically Signed   By: Corlis Leak M.D.   On: 12/06/2022 09:26   CT CHEST W CONTRAST  Result Date: 12/05/2022 CLINICAL DATA:  Follow-up empyema.  Acute respiratory failure. EXAM: CT CHEST WITH CONTRAST TECHNIQUE: Multidetector CT imaging of  the chest was performed during intravenous contrast administration. RADIATION DOSE REDUCTION: This exam was performed according to the departmental dose-optimization program which includes automated exposure control, adjustment of the mA and/or kV according to patient size and/or use of iterative reconstruction technique. CONTRAST:  75mL OMNIPAQUE IOHEXOL 300 MG/ML  SOLN COMPARISON:  11/30/2022 FINDINGS: Cardiovascular: The heart size appears normal. Small pericardial effusion identified which is new from the previous exam. Aortic atherosclerosis. Coronary artery calcifications. Mediastinum/Nodes: Thyroid gland, trachea, and esophagus are  unremarkable. Prominent mediastinal and hilar lymph nodes are again noted. Reference right hilar node measures 1.3 cm, image 68/2. Formally this measured the same. Lungs/Pleura: Interval placement of right-sided percutaneous pigtail drainage catheter into the previously noted empyema. The empyema measures 8.2 by 3.1 by 17.6 cm, image 91/2. Formally 10.0 x 9.0 by 19.4 cm. Overlying atelectasis and consolidative change is identified within the right lower lobe posteriorly. Progressive atelectasis with mild subpleural consolidative change in the posterior and medial left base. Interval improvement in patchy ground-glass densities within the right upper lobe. Upper Abdomen: No acute abnormality. Unchanged appearance bilobar liver cysts. Status post cholecystectomy. Musculoskeletal: No chest wall abnormality. No acute or significant osseous findings. IMPRESSION: 1. Interval placement of right-sided percutaneous pigtail drainage catheter into the previously noted empyema. The empyema measures 8.2 x 3.1 x 17.6 cm (volume = 230 cm^3). Formally 10.0 x 9.0 x 19.4 cm (volume = 910 cm^3). 2. Progressive atelectasis with mild subpleural consolidative change in the posterior and medial left base. 3. Interval improvement in patchy ground-glass densities within the right upper lobe. 4. Small pericardial effusion is new from the previous exam. 5. Coronary artery calcifications. 6.  Aortic Atherosclerosis (ICD10-I70.0). Electronically Signed   By: Signa Kell M.D.   On: 12/05/2022 13:42   DG Chest 2 View  Result Date: 12/05/2022 CLINICAL DATA:  Empyema EXAM: CHEST - 2 VIEW COMPARISON:  11/30/2022. FINDINGS: Left lung clear. Diffuse alveolar consolidation right lower lobe. Right basilar pigtail chest tube. No pneumothorax identified. Normal pulmonary vasculature. Unremarkable cardiac silhouette. IMPRESSION: Diffuse alveolar consolidation right lower lobe. Electronically Signed   By: Layla Maw M.D.   On: 12/05/2022 10:16    DG Outside Films Chest  Result Date: 12/03/2022 This examination belongs to an outside facility and is stored here for comparison purposes only.  Contact the originating outside institution for any associated report or interpretation.  CT PERC PLEURAL DRAIN W/INDWELL CATH W/IMG GUIDE  Result Date: 12/02/2022 INDICATION: [id number] Empyema [facility]) [id number] EXAM: CT-guided placement of chest tube TECHNIQUE: Multidetector CT imaging of the chest was performed following the standard protocol without IV contrast. RADIATION DOSE REDUCTION: This exam was performed according to the departmental dose-optimization program which includes automated exposure control, adjustment of the mA and/or kV according to patient size and/or use of iterative reconstruction technique. MEDICATIONS: Documented in the EMR ANESTHESIA/SEDATION: Moderate (conscious) sedation was employed during this procedure. A total of Versed 0.5 mg and Fentanyl 25 mcg was administered intravenously by the radiology nurse. Total intra-service moderate Sedation Time: 25 minutes. The patient's level of consciousness and vital signs were monitored continuously by radiology nursing throughout the procedure under my direct supervision. COMPLICATIONS: None immediate. PROCEDURE: Informed written consent was obtained from the patient after a thorough discussion of the procedural risks, benefits and alternatives. All questions were addressed. Maximal Sterile Barrier Technique was utilized including caps, mask, sterile gowns, sterile gloves, sterile drape, hand hygiene and skin antiseptic. A timeout was performed prior to the initiation of the procedure. The patient  was placed lateral decubitus on the exam table. Limited CT of the chest was performed for planning purposes. This demonstrated large loculated right pleural collection. Skin entry site was marked, and the overlying skin was prepped and draped in the standard sterile fashion. Local analgesia was obtained with  1% lidocaine. Using intermittent CT fluoroscopy, a 19 gauge Yueh catheter was advanced into the identified collection via a posterolateral approach. Location was confirmed with CT and return of purulent material. A sample was collected and sent to the lab for microbiology analysis. Over an Amplatz guidewire advanced through the access needle, percutaneous tract was serially dilated to accommodate a 10 French locking drainage catheter. Locking loop was formed, and location in the collection was confirmed with CT and return of additional purulent material. The drainage catheter was secured to the skin using silk suture and a dressing. It attached to a chest tube drainage system. The patient tolerated the procedure well, and was transferred to recovery in stable condition. IMPRESSION: Successful CT-guided placement of a 10 French locking drainage catheter into the right pleural collection, yielding purulent material consistent with an empyema. Sample sent for microbiology analysis. Electronically Signed   By: Olive Bass M.D.   On: 12/02/2022 11:53   CT CHEST ABDOMEN PELVIS W CONTRAST  Result Date: 12/01/2022 CLINICAL DATA:  Abnormal lung x-ray EXAM: CT CHEST, ABDOMEN, AND PELVIS WITH CONTRAST TECHNIQUE: Multidetector CT imaging of the chest, abdomen and pelvis was performed following the standard protocol during bolus administration of intravenous contrast. RADIATION DOSE REDUCTION: This exam was performed according to the departmental dose-optimization program which includes automated exposure control, adjustment of the mA and/or kV according to patient size and/or use of iterative reconstruction technique. CONTRAST:  75mL OMNIPAQUE IOHEXOL 300 MG/ML  SOLN COMPARISON:  CT abdomen and pelvis 04/10/2021. FINDINGS: CT CHEST FINDINGS Cardiovascular: No significant vascular findings. Normal heart size. No pericardial effusion. Mediastinum/Nodes: Visualized thyroid gland and esophagus are within normal limits. There  is an enlarged right hilar lymph node measuring 12 mm short axis. No other enlarged lymph nodes are seen. There secretions in the midtrachea. Lungs/Pleura: Well-circumscribed walled fluid collection is seen in the posterior right hemithorax, most likely within the pleural space. This measures 9.0 x 10.0 x 19.5 cm in largest AP, transverse and craniocaudad dimensions respectively. There is no air within this collection. There some septations in the superior portion of the collection. There is a adjacent compressive atelectasis of the right lower lobe. There is also atelectasis in the bilateral lung bases and right middle lobe. Patchy airspace opacities are seen in the inferior left lower lobe with some peribronchial wall thickening. Multifocal patchy ground-glass opacities are seen throughout the right upper lobe, likely infectious/inflammatory. There is no evidence for pneumothorax. Musculoskeletal: No chest wall mass or suspicious bone lesions identified. CT ABDOMEN PELVIS FINDINGS Hepatobiliary: Cystic areas in the liver appear unchanged from the prior study. The largest is in the right lobe measuring up to 4.3 cm. The gallbladder is surgically absent. There is no biliary ductal dilatation. Pancreas: Unremarkable. No pancreatic ductal dilatation or surrounding inflammatory changes. Spleen: Normal in size. Cystic areas in the spleen measuring up to 13 mm appear unchanged. Adrenals/Urinary Tract: Suprapubic Foley catheter decompresses the bladder. There is no hydronephrosis or perinephric fluid. Left renal cysts are present measuring up to 16 mm. These are similar to the prior study. The adrenal glands are within normal limits. Stomach/Bowel: There is a large amount of stool in the rectum. The colon is diffusely distended  with air without definite transition point concerning for colonic ileus. There is no focal inflammation or wall thickening identified. The appendix is not seen. Additionally there are scattered  air-fluid levels throughout small bowel loops without small bowel dilatation. The stomach is decompressed. Vascular/Lymphatic: Aortic atherosclerosis. No enlarged abdominal or pelvic lymph nodes. Reproductive: Prostate is small or absent. Other: There is no ascites. There is no focal abdominal wall hernia. Musculoskeletal: No acute or significant osseous findings. IMPRESSION: 1. Large walled-off fluid collection in the posterior right hemithorax, most likely within the pleural space. Findings are worrisome for empyema. 2. Patchy airspace opacities in the left lower lobe worrisome for pneumonia. 3. Patchy ground-glass opacities in the right upper lobe are likely infectious/inflammatory. 4. Right hilar lymphadenopathy, likely reactive. 5. Large amount of stool in the rectum with diffuse gaseous distension of the colon concerning for colonic ileus. No bowel obstruction. 6. Scattered air-fluid levels throughout small bowel loops are nonspecific, but can be seen in the setting of enteritis or ileus. 7. Suprapubic catheter in place. 8. Aortic atherosclerosis. Aortic Atherosclerosis (ICD10-I70.0). Electronically Signed   By: Darliss Cheney M.D.   On: 12/01/2022 00:03   DG Chest Port 1 View  Result Date: 11/30/2022 CLINICAL DATA:  Shortness of breath EXAM: PORTABLE CHEST 1 VIEW COMPARISON:  04/14/2021, 04/17/2021 FINDINGS: Large ovoid opacity in the right thorax. Left lung grossly clear. Normal cardiac size. No pneumothorax. Multiple loops of air distended bowel in the upper abdomen IMPRESSION: Large ovoid opacity in the right thorax, indeterminate for large pulmonary mass or other airspace disease. Recommend contrast-enhanced chest CT for further assessment. Electronically Signed   By: Jasmine Pang M.D.   On: 11/30/2022 21:34    Microbiology: Results for orders placed or performed during the hospital encounter of 11/30/22  Urine Culture     Status: Abnormal   Collection Time: 11/30/22  8:02 PM   Specimen: Urine,  Random  Result Value Ref Range Status   Specimen Description   Final    URINE, RANDOM Performed at [facility], [address] [place], [place] [address]    Special Requests   Final    NONE Reflexed from [id number] Performed at [facility], [address] [place], [place] [address]    Culture (A)  Final    <10,000 COLONIES/mL INSIGNIFICANT GROWTH Performed at [facility], [address], [place], [place] [address]    Report Status 12/02/2022 FINAL  Final  Resp panel by RT-PCR (RSV, Flu A&B, Covid) Anterior Nasal Swab     Status: None   Collection Time: 11/30/22  8:03 PM   Specimen: Anterior Nasal Swab  Result Value Ref Range Status   SARS Coronavirus 2 by RT PCR NEGATIVE NEGATIVE Final    Comment: (NOTE) SARS-CoV-2 target nucleic acids are NOT DETECTED.  The SARS-CoV-2 RNA is generally detectable in upper respiratory specimens during the acute phase of infection. The lowest concentration of SARS-CoV-2 viral copies this assay can detect is 138 copies/mL. A negative result does not preclude SARS-Cov-2 infection and should not be used as the sole basis for treatment or other patient management decisions. A negative result may occur with  improper specimen collection/handling, submission of specimen other than nasopharyngeal swab, presence of viral mutation(s) within the areas targeted by this assay, and inadequate number of viral copies(<138 copies/mL). A negative result must be combined with clinical observations, patient history, and epidemiological information. The expected result is Negative.  Fact Sheet for Patients:  BloggerCourse.com  Fact  Sheet for Healthcare Providers:  SeriousBroker.it  This test is no t yet approved or cleared by the [place] FDA and  has been authorized for detection and/or diagnosis of SARS-CoV-2 by FDA under an Emergency Use Authorization (EUA). This EUA will remain   in effect (meaning this test can be used) for the duration of the COVID-19 declaration under Section 564(b)(1) of the Act, 21 U.S.C.section 360bbb-3(b)(1), unless the authorization is terminated  or revoked sooner.       Influenza A by PCR NEGATIVE NEGATIVE Final   Influenza B by PCR NEGATIVE NEGATIVE Final    Comment: (NOTE) The Xpert Xpress SARS-CoV-2/FLU/RSV plus assay is intended as an aid in the diagnosis of influenza from Nasopharyngeal swab specimens and should not be used as a sole basis for treatment. Nasal washings and aspirates are unacceptable for Xpert Xpress SARS-CoV-2/FLU/RSV testing.  Fact Sheet for Patients: BloggerCourse.com  Fact Sheet for Healthcare Providers: SeriousBroker.it  This test is not yet approved or cleared by the [place] FDA and has been authorized for detection and/or diagnosis of SARS-CoV-2 by FDA under an Emergency Use Authorization (EUA). This EUA will remain in effect (meaning this test can be used) for the duration of the COVID-19 declaration under Section 564(b)(1) of the Act, 21 U.S.C. section 360bbb-3(b)(1), unless the authorization is terminated or revoked.     Resp Syncytial Virus by PCR NEGATIVE NEGATIVE Final    Comment: (NOTE) Fact Sheet for Patients: BloggerCourse.com  Fact Sheet for Healthcare Providers: SeriousBroker.it  This test is not yet approved or cleared by the [place] FDA and has been authorized for detection and/or diagnosis of SARS-CoV-2 by FDA under an Emergency Use Authorization (EUA). This EUA will remain in effect (meaning this test can be used) for the duration of the COVID-19 declaration under Section 564(b)(1) of the Act, 21 U.S.C. section 360bbb-3(b)(1), unless the authorization is terminated or revoked.  Performed at [facility], [address] [place], [place] [address]   Blood  Culture (routine x 2)     Status: None   Collection Time: 11/30/22  8:03 PM   Specimen: BLOOD  Result Value Ref Range Status   Specimen Description BLOOD BLOOD RIGHT ARM  Final   Special Requests   Final    BOTTLES DRAWN AEROBIC AND ANAEROBIC Blood Culture results may not be optimal due to an excessive volume of blood received in culture bottles   Culture   Final    NO GROWTH 5 DAYS Performed at [facility], [address], [place], [place] [address]    Report Status 12/05/2022 FINAL  Final  Blood Culture (routine x 2)     Status: None   Collection Time: 11/30/22  8:03 PM   Specimen: BLOOD LEFT ARM  Result Value Ref Range Status   Specimen Description   Final    BLOOD LEFT ARM Performed at [facility], [address], [place], [place] [address]    Special Requests   Final    BOTTLES DRAWN AEROBIC AND ANAEROBIC Blood Culture results may not be optimal due to an excessive volume of blood received in culture bottles Performed at [facility], [address], [place], [place] [address]    Culture  Setup Time   Final    ANAEROBIC BOTTLE ONLY Performed at [facility], [address], [place], [place] [address]    Culture   Final    NO GROWTH 5 DAYS Performed at [facility],  [address], [place], [place] [address]    Report Status 12/06/2022 FINAL  Final  Aerobic/Anaerobic Culture w Gram Stain (surgical/deep wound)     Status: None (Preliminary result)   Collection Time: 12/02/22 10:37 AM   Specimen: Abscess  Result Value Ref Range Status   Specimen Description   Final    ABSCESS Performed at [facility], [address] [place], [place] [address]    Special Requests   Final    RIGHT EMPYEMA PLEURAL RIGHT Performed at [facility], [address] [place], [place] [address]    Gram Stain   Final    MODERATE WBC PRESENT, PREDOMINANTLY PMN RARE GRAM POSITIVE COCCI IN CLUSTERS    Culture   Final    MODERATE  STREPTOCOCCUS CONSTELLATUS Sent to [facility] for further susceptibility testing. NO ANAEROBES ISOLATED Performed at [facility], [address], [place], [place] [address]    Report Status PENDING  Incomplete  Susceptibility, Aer + Anaerob     Status: Abnormal   Collection Time: 12/02/22 10:37 AM  Result Value Ref Range Status   Suscept, Aer + Anaerob Final report (A)  Corrected    Comment: (NOTE) Performed At: [facility] [address] [place], [place] [id number] Jolene Schimke MD LK:[id number] CORRECTED ON 09/20 AT 6644: PREVIOUSLY REPORTED AS Preliminary report    Source of Sample   Final    STREP.CONSTELLATUS SUSCEPTIBILITY empyema EMPYEMA PLEURAL ABSC.    Comment: Performed at [facility], [address] [place], [place] [address]  Susceptibility Result     Status: Abnormal   Collection Time: 12/02/22 10:37 AM  Result Value Ref Range Status   Suscept Result 1 Comment (A)  Final    Comment: (NOTE) Streptococcus constellatus Identification performed by account, not confirmed by this laboratory.    Antimicrobial Suscept Comment  Corrected    Comment: (NOTE)      ** S = Susceptible; I = Intermediate; R = Resistant **                   P = Positive; N = Negative            MICS are expressed in micrograms per mL   Antibiotic                 RSLT#1    RSLT#2    RSLT#3    RSLT#4 Cefepime                       S Cefotaxime                     S Ceftriaxone                    S Chloramphenicol                S Clindamycin                    S Erythromycin                   S Levofloxacin                   S Penicillin                     S Vancomycin  S Performed At: [facility] [place] [address] [place], [place] [id number] Jolene Schimke MD NW:[id number]     Labs: CBC: Recent Labs  Lab 12/06/22 0827 12/07/22 0530 12/08/22 0422 12/09/22 0311 12/12/22 0415  WBC 4.3 4.8 5.6 6.2 7.6  HGB 9.9* 9.8* 9.7* 10.0* 10.5*   HCT 31.6* 31.3* 30.8* 31.1* 33.1*  MCV 91.9 92.6 91.9 91.7 90.2  PLT 432* 371 382 394 375   Basic Metabolic Panel: Recent Labs  Lab 12/06/22 0827 12/07/22 0530 12/08/22 0422 12/09/22 0311  NA 133* 133* 134* 134*  K 4.0 4.4 4.3 4.6  CL 98 98 97* 95*  CO2 27 29 32 30  GLUCOSE 99 113* 105* 108*  BUN 7* 8 9 12   CREATININE 0.35* 0.47* 0.58* 0.56*  CALCIUM 8.4* 8.2* 8.4* 8.4*   Liver Function Tests: No results for input(s): "AST", "ALT", "ALKPHOS", "BILITOT", "PROT", "ALBUMIN" in the last 168 hours. CBG: No results for input(s): "GLUCAP" in the last 168 hours.  Discharge time spent: {LESS THAN/GREATER VHQI:69629} 30 minutes.  Signed: Pennie Banter, DO [facility] 12/12/2022

## 2022-12-12 NOTE — TOC Progression Note (Signed)
Transition of Care [facility]) - Progression Note    Patient Details  Name: Ronald Santiago MRN: [id number] Date of Birth: 06/05/1945  [facility] CM/SW Contact  Susa Simmonds, [place] Phone Number: 12/12/2022, 12:44 PM  Clinical Narrative:   EMS is not able to provide out of county transportation today.     Expected Discharge Plan: Home w Home Health Services Barriers to Discharge: Continued Medical Work up  Expected Discharge Plan and Services   Discharge Planning Services: CM Consult   Living arrangements for the past 2 months: Single Family Home Expected Discharge Date: 12/12/22               DME Arranged: Hospital bed DME Agency: [facility] Date DME Agency Contacted: 12/09/22 Time DME Agency Contacted: 1346 Representative spoke with at DME Agency: Fayrene Fearing HH Arranged: PT, OT [facility] Agency: [facility] Date [facility] Agency Contacted: 12/08/22 Time HH Agency Contacted: [phone] Representative spoke with at [facility] Agency: [place]   Social Determinants of Health (SDOH) Interventions SDOH Screenings   Food Insecurity: Patient Unable To Answer (12/01/2022)  Housing: Patient Unable To Answer (12/01/2022)  Transportation Needs: Patient Unable To Answer (12/01/2022)  Utilities: Patient Unable To Answer (12/01/2022)  Financial Resource Strain: Patient Declined (10/28/2022)   Received from [facility]  Social Connections: Unknown (08/05/2021)   [facility]  Tobacco Use: Low Risk  (12/01/2022)  Recent Concern: Tobacco Use - Medium Risk (10/29/2022)   Received from [facility]    Readmission Risk Interventions     No data to display

## 2022-12-12 NOTE — Plan of Care (Signed)

## 2022-12-13 ENCOUNTER — Encounter: Payer: Self-pay | Admitting: Osteopathic Medicine

## 2022-12-13 DIAGNOSIS — J9601 Acute respiratory failure with hypoxia: Secondary | ICD-10-CM | POA: Diagnosis not present

## 2022-12-13 NOTE — Plan of Care (Signed)

## 2022-12-13 NOTE — Progress Notes (Signed)
PROGRESS NOTE  Ronald Santiago   YNW:[id number] DOB: 30-Dec-1945  DOA: 11/30/2022 Date of Service: 12/13/22 PCP: Mick Sell, MD  Brief Narrative / Hospital Course:  Ronald Santiago is a 77 y.o. 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 Few courses of abx since 08/2022 for presumed PNA/bronchitis.CXR 11/06/22 (+)RLL infiltrate. 11/27/22 SpO2 87% w/ home PT but pt refused to go to ED>  09/09: to ED. SpO2 80s improved on 3L [place] to 96%. WBC 16.8, sodium 128, Chest x-ray was obtained that demonstrated a large ovoid opacity in the right thorax. CT chest/abd/pelv (+) concern for pulmonary empyema, pneumonia, reactive hilar lymphadenopathy, colonic ileus/enteritis. Admitted to hospitalist service.  09/10: IR plan chest tube placement tomorrow, consult to pulmonary (Dr Belia Heman alerted and will see pt).  9/11: Chest tube placed without complication. Patient doing well post-procedure. Remains on 2L [place] 9/12: patient was febrile overnight tmax 100.7. blood cultures remain negative. Culture from pleural sample NGTD. Fibrinolytic therapy.  9/13: patient feeling better today. Afebrile >24 hours. Culture positive for streptococcus constellatus. Fibrinolytic therapy with follow up CT chest today. Palliative meeting with patient and wife today.  9/14-9/16: culture sensitivities still pending. Chest CT and xrays reveal improvement in overall status of empyema. Continuing with saline pleural flushes to prevent clotting. Pulmonology continues to follow. Appreciate palliative involvement in ongoing discussions for disposition and GOC.  9/17: Confirmed with pulmonology and IR that current chest tube can remain in  place 4-6 weeks and will need to continue to have saline flushes to prevent clotting. ID consulted and recommends remaining on unasyn while in the hospital and transition to augmentin for total 4 week course when discharged. Likely continuously aspirating and perpetuating the infection. Breathing status has been stable on 3-4L [place]. Palliative and TOC are in discussions with long-term planning with family and patient.  9/19: chest tube removed.  On 1-2 L [place] O2. 9/20: no BM yet despite increased bowel regimen.  Weaning off oxygen, sats in upper 90's on 1 L/ min  9/21 --discharge had to be cancelled, no EMS transport available  Pt is medically stable for discharge.   Consultants:  Interventional radiology Pulmonology  Palliative ID   Procedures: Chest tube placement 9/11    ASSESSMENT & PLAN:  Principal Problem:   Acute hypoxic respiratory failure (HCC) Active Problems:   Lung mass   Hyponatremia   Leukocytosis   Stage IV decubitus ulcer (HCC)   Multiple sclerosis, primary progressive (HCC)   Flaccid neuropathic bladder, not elsewhere classified   Constipation   Acute hypoxic respiratory failure  lung mass/empyema, pneumonia  Continue supplemental oxygen to maintain oxygen saturation above 88%. Wean as tolerated but will likely require discharge with supplemental oxygen. Has been stable on 3-4L several days. Denies respiratory distress Chest PT Treat underlying causes as below Flutter valve, incentive spirometer    Lung abscess  empyema ID, IR and Pulmonology consulted Chest tube placed 9/11, removed on 9/19 Outpatient follow up with pulmonology.  Pulmonary consult - chest tube management, fibrinolytic therapy completed 3 days.  Intially ceftriaxone and azithromycin---> IV unasyn per ID Transitioned to augmentin  Total 4 weeks treatment, per ID.  Ends Oct 7th  Anemia of chronic disease- hgb steady. 11.5>>>9.7. no sign of  acute bleeding. No clear blood in pleural  drainage Transfusion threshold 7 - monitor CBC   Hyponatremia- stable/improving. Na+ 131>>>134. PO intake has diminished prior to arrival so likely dehydration related, ddx would include SIADH given pulmonary pathology   Monitor sodium   Constipation, concern for ileus- as seen on chest imaging. Had multiple small BMs since admission. States that for him, going 4 days in between BMs is normal. Continue home regimen, additional bowel regimen added Adding Enema prn   Flaccid neuropathic bladder, intermittent spasm Continue suprapubic catheter Also on oxybutynin for intermittent bladder spasm which causes urethral urine leakage    Multiple sclerosis, primary progressive (HCC) History of primary progressive MS, currently bedbound. Continue home oxycodone, baclofen and gabapentin At home takes "4 ibuprofen in the morning and 4 in the evening" and has for several years. Discussed the high risk of side effects of this regimen with patient and wife and recommended tylenol and trial of naproxen PRN Mobilize in bed / OOB to chair as able  Has been made DNI/DNR.  Palliative consulted.  New hospital bed ordered to facilitate pt care at home   Stage IV decubitus ulcer (HCC) Per patient's wife, patient has chronic ulcerations and HH visits to help manage.  routine care WOC consult    Concern for malnutrition/underweight based on BMI: Body mass index is 23.06 kg/m.   Hearing decrease- likely cerumen impaction - examine with otoscope - debrox drops - ear lavage   DVT prophylaxis: lovenox  Nutrition: regular diet Central lines / invasive devices: chronic suprapubic catheter, R chest tube   Code Status: DNR   Barriers to dispo / significant pending items:  No EMS transport out of county available this weekend   Subjective / Brief ROS:  Patient seen today, wife at bedside.  Pt's voice is stronger today & he reports feeling well.  They were very disappointed d/c had to be cancelled  yesterday due to lack of EMS on weekend to transport home.  He feels well but hopes we can get him home today.    Family Communication: wife at updated at bedside on rounds   Objective Findings:  Vitals:   12/12/22 1644 12/12/22 2219 12/13/22 0905 12/13/22 1522  BP: 104/69 119/70 113/73 103/65  Pulse: 75  73 70  Resp: 16 16 16 16   Temp: 97.8 F (36.6 C) 98 F (36.7 C) 97.9 F (36.6 C) 98 F (36.7 C)  TempSrc: Oral     SpO2: 97% 95% 99% 96%  Weight:      Height:        Intake/Output Summary (Last 24 hours) at 12/13/2022 1523 Last data filed at 12/13/2022 1100 Gross per 24 hour  Intake 921.17 ml  Output 2075 ml  Net -1153.83 ml   Filed Weights   11/30/22 1910  Weight: 77.1 kg   Examination:  Physical Exam  General exam: awake, alert, no acute distress HEENT: moist mucus membranes, hearing grossly normal  Respiratory system: lungs clear, on room air, normal respiratory effort. Cardiovascular system: normal S1/S2, RRR,  no pedal edema.   Gastrointestinal system: soft, NT, ND, no HSM felt, +bowel sounds. Central nervous system: A&O x 3. no gross focal neurologic deficits, normal speech Skin: dry, intact, normal temperature Psychiatry: normal mood, congruent affect, judgement and insight appear normal     Scheduled Medications:   amoxicillin-clavulanate  1 tablet Oral Q12H   baclofen  20 mg Oral BID WC   baclofen  40 mg Oral QHS   carbamide  peroxide  5 drop Both EARS BID   docusate sodium  400 mg Oral QHS   enoxaparin (LOVENOX) injection  40 mg Subcutaneous Q24H   gabapentin  1,200 mg Oral TID   influenza vaccine adjuvanted  0.5 mL Intramuscular Tomorrow-1000   loratadine  10 mg Oral Daily   magnesium oxide  400 mg Oral Daily   naloxegol oxalate  25 mg Oral Daily   naproxen  500 mg Oral BID WC   oxybutynin  5 mg Oral TID   oxyCODONE  10 mg Oral Q8H   polyethylene glycol  17 g Oral Daily   senna-docusate  1 tablet Oral BID   sodium chloride flush  3 mL  Intravenous Q12H   Continuous Infusions:  sodium chloride 10 mL/hr at 12/12/22 1748   Antimicrobials from admission:  Anti-infectives (From admission, onward)    Start     Dose/Rate Route Frequency Ordered Stop   12/12/22 1800  amoxicillin-clavulanate (AUGMENTIN) 875-125 MG per tablet 1 tablet        1 tablet Oral Every 12 hours 12/12/22 1712     12/12/22 0000  amoxicillin-clavulanate (AUGMENTIN) 875-125 MG tablet        1 tablet Oral 2 times daily 12/12/22 1132 12/29/22 2359   12/01/22 0015  Ampicillin-Sulbactam (UNASYN) 3 g in sodium chloride 0.9 % 100 mL IVPB  Status:  Discontinued        3 g 200 mL/hr over 30 Minutes Intravenous Every 6 hours 12/01/22 0010 12/12/22 1712   11/30/22 2000  cefTRIAXone (ROCEPHIN) 1 g in sodium chloride 0.9 % 100 mL IVPB  Status:  Discontinued        1 g 200 mL/hr over 30 Minutes Intravenous Every 24 hours 11/30/22 1948 11/30/22 2335   11/30/22 2000  azithromycin (ZITHROMAX) 500 mg in sodium chloride 0.9 % 250 mL IVPB        500 mg 250 mL/hr over 60 Minutes Intravenous Every 24 hours 11/30/22 1948 12/04/22 2122   11/30/22 1945  levofloxacin (LEVAQUIN) IVPB 750 mg  Status:  Discontinued        750 mg 100 mL/hr over 90 Minutes Intravenous  Once 11/30/22 1935 11/30/22 1948       Data Reviewed:  I have personally reviewed the following...  CBC: Recent Labs  Lab 12/07/22 0530 12/08/22 0422 12/09/22 0311 12/12/22 0415  WBC 4.8 5.6 6.2 7.6  HGB 9.8* 9.7* 10.0* 10.5*  HCT 31.3* 30.8* 31.1* 33.1*  MCV 92.6 91.9 91.7 90.2  PLT 371 382 394 375   Basic Metabolic Panel: Recent Labs  Lab 12/07/22 0530 12/08/22 0422 12/09/22 0311  NA 133* 134* 134*  K 4.4 4.3 4.6  CL 98 97* 95*  CO2 29 32 30  GLUCOSE 113* 105* 108*  BUN 8 9 12   CREATININE 0.47* 0.58* 0.56*  CALCIUM 8.2* 8.4* 8.4*      LOS: 12 days    Time spent: 25 min    Pennie Banter, DO [facility] 12/13/2022, 3:23 PM   If 7PM-7AM, please contact night  coverage [email or web address]

## 2022-12-14 NOTE — Progress Notes (Signed)
EMS transported pt from [facility] to Home.

## 2022-12-14 NOTE — Plan of Care (Signed)

## 2022-12-14 NOTE — Plan of Care (Signed)
Problem: Health Behavior/Discharge Planning: Goal: Ability to manage health-related needs will improve Outcome: Progressing   Problem: Nutrition: Goal: Adequate nutrition will be maintained Outcome: Progressing   Problem: Coping: Goal: Level of anxiety will decrease Outcome: Progressing   Problem: Safety: Goal: Ability to remain free from injury will improve Outcome: Progressing

## 2022-12-14 NOTE — Care Management Important Message (Signed)
Important Message  Patient Details  Name: Ronald Santiago MRN: [id number] Date of Birth: 01-04-1946   Medicare Important Message Given:  Yes     Olegario Messier A Azul Brumett 12/14/2022, 10:47 AM

## 2022-12-18 LAB — AEROBIC/ANAEROBIC CULTURE W GRAM STAIN (SURGICAL/DEEP WOUND)

## 2023-06-02 ENCOUNTER — Inpatient Hospital Stay

## 2023-06-02 ENCOUNTER — Inpatient Hospital Stay
Admission: EM | Admit: 2023-06-02 | Discharge: 2023-06-11 | DRG: 698 | Disposition: A | Discharge status: Home Health | Attending: Hospitalist | Admitting: Hospitalist

## 2023-06-02 ENCOUNTER — Emergency Department

## 2023-06-02 DIAGNOSIS — T83510A Infection and inflammatory reaction due to cystostomy catheter, initial encounter: Secondary | ICD-10-CM | POA: Diagnosis present

## 2023-06-02 DIAGNOSIS — Z79891 Long term (current) use of opiate analgesic: Secondary | ICD-10-CM | POA: Diagnosis not present

## 2023-06-02 DIAGNOSIS — K6289 Other specified diseases of anus and rectum: Secondary | ICD-10-CM

## 2023-06-02 DIAGNOSIS — L89154 Pressure ulcer of sacral region, stage 4: Secondary | ICD-10-CM | POA: Diagnosis present

## 2023-06-02 DIAGNOSIS — Y846 Urinary catheterization as the cause of abnormal reaction of the patient, or of later complication, without mention of misadventure at the time of the procedure: Secondary | ICD-10-CM | POA: Diagnosis present

## 2023-06-02 DIAGNOSIS — K56609 Unspecified intestinal obstruction, unspecified as to partial versus complete obstruction: Secondary | ICD-10-CM | POA: Diagnosis present

## 2023-06-02 DIAGNOSIS — Z881 Allergy status to other antibiotic agents status: Secondary | ICD-10-CM

## 2023-06-02 DIAGNOSIS — E871 Hypo-osmolality and hyponatremia: Secondary | ICD-10-CM | POA: Diagnosis present

## 2023-06-02 DIAGNOSIS — Z7189 Other specified counseling: Secondary | ICD-10-CM | POA: Diagnosis not present

## 2023-06-02 DIAGNOSIS — Z79899 Other long term (current) drug therapy: Secondary | ICD-10-CM

## 2023-06-02 DIAGNOSIS — Z6823 Body mass index (BMI) 23.0-23.9, adult: Secondary | ICD-10-CM

## 2023-06-02 DIAGNOSIS — Z66 Do not resuscitate: Secondary | ICD-10-CM | POA: Diagnosis present

## 2023-06-02 DIAGNOSIS — Z87892 Personal history of anaphylaxis: Secondary | ICD-10-CM | POA: Diagnosis not present

## 2023-06-02 DIAGNOSIS — A419 Sepsis, unspecified organism: Secondary | ICD-10-CM

## 2023-06-02 DIAGNOSIS — G35 Multiple sclerosis: Secondary | ICD-10-CM | POA: Diagnosis present

## 2023-06-02 DIAGNOSIS — R600 Localized edema: Secondary | ICD-10-CM | POA: Diagnosis present

## 2023-06-02 DIAGNOSIS — N390 Urinary tract infection, site not specified: Secondary | ICD-10-CM | POA: Diagnosis present

## 2023-06-02 DIAGNOSIS — Z515 Encounter for palliative care: Secondary | ICD-10-CM | POA: Diagnosis not present

## 2023-06-02 DIAGNOSIS — R627 Adult failure to thrive: Secondary | ICD-10-CM | POA: Diagnosis present

## 2023-06-02 DIAGNOSIS — K5909 Other constipation: Secondary | ICD-10-CM | POA: Diagnosis present

## 2023-06-02 DIAGNOSIS — E876 Hypokalemia: Secondary | ICD-10-CM | POA: Diagnosis present

## 2023-06-02 DIAGNOSIS — F22 Delusional disorders: Secondary | ICD-10-CM | POA: Diagnosis present

## 2023-06-02 DIAGNOSIS — G894 Chronic pain syndrome: Secondary | ICD-10-CM | POA: Diagnosis present

## 2023-06-02 DIAGNOSIS — K592 Neurogenic bowel, not elsewhere classified: Secondary | ICD-10-CM | POA: Diagnosis present

## 2023-06-02 DIAGNOSIS — R6520 Severe sepsis without septic shock: Secondary | ICD-10-CM | POA: Diagnosis present

## 2023-06-02 DIAGNOSIS — L8994 Pressure ulcer of unspecified site, stage 4: Secondary | ICD-10-CM | POA: Diagnosis present

## 2023-06-02 DIAGNOSIS — E43 Unspecified severe protein-calorie malnutrition: Secondary | ICD-10-CM | POA: Diagnosis present

## 2023-06-02 DIAGNOSIS — F05 Delirium due to known physiological condition: Secondary | ICD-10-CM | POA: Diagnosis present

## 2023-06-02 DIAGNOSIS — K922 Gastrointestinal hemorrhage, unspecified: Secondary | ICD-10-CM | POA: Diagnosis present

## 2023-06-02 DIAGNOSIS — N312 Flaccid neuropathic bladder, not elsewhere classified: Secondary | ICD-10-CM | POA: Diagnosis present

## 2023-06-02 DIAGNOSIS — Z7401 Bed confinement status: Secondary | ICD-10-CM

## 2023-06-02 DIAGNOSIS — K56600 Partial intestinal obstruction, unspecified as to cause: Secondary | ICD-10-CM | POA: Diagnosis present

## 2023-06-02 DIAGNOSIS — R5381 Other malaise: Secondary | ICD-10-CM | POA: Diagnosis present

## 2023-06-02 DIAGNOSIS — N319 Neuromuscular dysfunction of bladder, unspecified: Secondary | ICD-10-CM | POA: Diagnosis present

## 2023-06-02 LAB — URINALYSIS, ROUTINE W REFLEX MICROSCOPIC
Bilirubin Urine: NEGATIVE
Glucose, UA: NEGATIVE mg/dL
Hgb urine dipstick: NEGATIVE
Ketones, ur: NEGATIVE mg/dL
Nitrite: NEGATIVE
Protein, ur: 30 mg/dL — AB
Specific Gravity, Urine: 1.015 (ref 1.005–1.030)
WBC, UA: >50 WBC/hpf (ref 0–5)
pH: 5 (ref 5.0–8.0)

## 2023-06-02 LAB — COMPREHENSIVE METABOLIC PANEL
ALT: 38 U/L (ref 0–44)
AST: 32 U/L (ref 15–41)
Albumin: 3.2 g/dL — ABNORMAL LOW (ref 3.5–5.0)
Alkaline Phosphatase: 53 U/L (ref 38–126)
Anion gap: 11 (ref 5–15)
BUN: 27 mg/dL — ABNORMAL HIGH (ref 8–23)
CO2: 16 mmol/L — ABNORMAL LOW (ref 22–32)
Calcium: 7.7 mg/dL — ABNORMAL LOW (ref 8.9–10.3)
Chloride: 104 mmol/L (ref 98–111)
Creatinine, Ser: 0.92 mg/dL (ref 0.61–1.24)
GFR, Estimated: >60 mL/min (ref >60)
Glucose, Bld: 169 mg/dL — ABNORMAL HIGH (ref 70–99)
Potassium: 4.2 mmol/L (ref 3.5–5.1)
Sodium: 131 mmol/L — ABNORMAL LOW (ref 135–145)
Total Bilirubin: 1 mg/dL (ref 0.0–1.2)
Total Protein: 7.3 g/dL (ref 6.5–8.1)

## 2023-06-02 LAB — HEMOGLOBIN AND HEMATOCRIT, BLOOD
HCT: 42.8 % (ref 39.0–52.0)
HCT: 40 % (ref 39.0–52.0)
Hemoglobin: 14.6 g/dL (ref 13.0–17.0)
Hemoglobin: 13.5 g/dL (ref 13.0–17.0)

## 2023-06-02 LAB — CBC WITH DIFFERENTIAL/PLATELET
Abs Immature Granulocytes: 0.06 10*3/uL (ref 0.00–0.07)
Basophils Absolute: 0 10*3/uL (ref 0.0–0.1)
Basophils Relative: 0 %
Eosinophils Absolute: 0 10*3/uL (ref 0.0–0.5)
Eosinophils Relative: 0 %
HCT: 50.5 % (ref 39.0–52.0)
Hemoglobin: 17.7 g/dL — ABNORMAL HIGH (ref 13.0–17.0)
Immature Granulocytes: 0 %
Lymphocytes Relative: 4 %
Lymphs Abs: 0.7 10*3/uL (ref 0.7–4.0)
MCH: 31.6 pg (ref 26.0–34.0)
MCHC: 35 g/dL (ref 30.0–36.0)
MCV: 90 fL (ref 80.0–100.0)
Monocytes Absolute: 0.4 10*3/uL (ref 0.1–1.0)
Monocytes Relative: 3 %
Neutro Abs: 15.7 10*3/uL — ABNORMAL HIGH (ref 1.7–7.7)
Neutrophils Relative %: 93 %
Platelets: 346 10*3/uL (ref 150–400)
RBC: 5.61 MIL/uL (ref 4.22–5.81)
RDW: 14 % (ref 11.5–15.5)
WBC: 16.9 10*3/uL — ABNORMAL HIGH (ref 4.0–10.5)
nRBC: 0 % (ref 0.0–0.2)

## 2023-06-02 LAB — LACTIC ACID, PLASMA
Lactic Acid, Venous: 2.3 mmol/L (ref 0.5–1.9)
Lactic Acid, Venous: 2.4 mmol/L (ref 0.5–1.9)
Lactic Acid, Venous: 1.7 mmol/L (ref 0.5–1.9)

## 2023-06-02 LAB — LIPASE, BLOOD: Lipase: 27 U/L (ref 11–51)

## 2023-06-02 MED ORDER — ONDANSETRON HCL 4 MG/2ML IJ SOLN
4.0000 mg | INTRAMUSCULAR | Status: DC | PRN
  Administered 2023-06-02 – 2023-06-03 (×4): 4 mg via INTRAVENOUS
  Filled 2023-06-02 (×5): qty 2

## 2023-06-02 MED ORDER — HYDROMORPHONE HCL 1 MG/ML IJ SOLN
1.0000 mg | INTRAMUSCULAR | Status: DC | PRN
  Administered 2023-06-02 – 2023-06-03 (×4): 1 mg via INTRAVENOUS
  Filled 2023-06-02 (×4): qty 1

## 2023-06-02 MED ORDER — ONDANSETRON HCL 4 MG/2ML IJ SOLN
4.0000 mg | Freq: Once | INTRAMUSCULAR | Status: AC
  Administered 2023-06-02: 4 mg via INTRAVENOUS
  Filled 2023-06-02: qty 2

## 2023-06-02 MED ORDER — SODIUM CHLORIDE 0.9 % IV SOLN: INTRAVENOUS | Status: AC

## 2023-06-02 MED ORDER — SODIUM CHLORIDE 0.9 % IV BOLUS
1000.0000 mL | Freq: Once | INTRAVENOUS | Status: AC
  Administered 2023-06-02: 1000 mL via INTRAVENOUS

## 2023-06-02 MED ORDER — DIATRIZOATE MEGLUMINE & SODIUM 66-10 % PO SOLN
90.0000 mL | Freq: Once | ORAL | Status: AC
  Administered 2023-06-02: 90 mL via NASOGASTRIC

## 2023-06-02 MED ORDER — SODIUM CHLORIDE 0.9 % IV SOLN
2.0000 g | INTRAVENOUS | Status: DC
  Administered 2023-06-02 – 2023-06-05 (×4): 2 g via INTRAVENOUS
  Filled 2023-06-02 (×4): qty 20

## 2023-06-02 MED ORDER — SODIUM CHLORIDE 0.9 % IV SOLN
2.0000 g | Freq: Once | INTRAVENOUS | Status: AC
  Administered 2023-06-02: 2 g via INTRAVENOUS
  Filled 2023-06-02: qty 10

## 2023-06-02 MED ORDER — IOHEXOL 300 MG/ML  SOLN
100.0000 mL | Freq: Once | INTRAMUSCULAR | Status: AC | PRN
  Administered 2023-06-02: 100 mL via INTRAVENOUS

## 2023-06-02 MED ORDER — MORPHINE SULFATE (PF) 4 MG/ML IV SOLN
4.0000 mg | Freq: Once | INTRAVENOUS | Status: AC
  Administered 2023-06-02: 4 mg via INTRAVENOUS
  Filled 2023-06-02: qty 1

## 2023-06-02 MED ORDER — METRONIDAZOLE 500 MG/100ML IV SOLN
500.0000 mg | Freq: Two times a day (BID) | INTRAVENOUS | Status: DC
  Administered 2023-06-02 – 2023-06-03 (×3): 500 mg via INTRAVENOUS
  Filled 2023-06-02 (×4): qty 100

## 2023-06-02 MED ORDER — PANTOPRAZOLE SODIUM 40 MG IV SOLR
40.0000 mg | Freq: Two times a day (BID) | INTRAVENOUS | Status: DC
  Administered 2023-06-02 – 2023-06-09 (×16): 40 mg via INTRAVENOUS
  Filled 2023-06-02 (×16): qty 10

## 2023-06-02 MED ORDER — FENTANYL CITRATE PF 50 MCG/ML IJ SOSY
50.0000 ug | PREFILLED_SYRINGE | Freq: Once | INTRAMUSCULAR | Status: AC
  Administered 2023-06-02: 50 ug via INTRAVENOUS
  Filled 2023-06-02: qty 1

## 2023-06-02 NOTE — Assessment & Plan Note (Signed)
 Na 131 Near baseline

## 2023-06-02 NOTE — H&P (Addendum)
 History and Physical    Patient: Ronald Santiago WGN:[id number] DOB: [birth year] DOA: 06/02/2023 DOS: the patient was seen and examined on 06/02/2023 PCP: Mick Sell, MD  Patient coming from: Home  Chief Complaint:  Chief Complaint  Patient presents with   Abdominal Pain   Emesis   HPI: Ronald Santiago is a 78 y.o. 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 Pt reports progression of moderate to severe generalized abdominal pain  and vomiting since yesterday. No fevers or chills.  Baseline progressive MS-fully bedbound.  No reports of falls or recent trauma.  Does take high-dose ibuprofen daily-roughly 8 tablets.  No reported tobacco or alcohol use.  Noted to have been admitted September 2024 for issues including lung mass with empyema/abscess.  No reports of cough or shortness of breath.  Noted lower abdominal pain with mild irritation. Presented to the ER afebrile, heart rate 100s, BP stable, satting well on room air.  White count 17, hemoglobin 17.7, platelets 346, lactate 2.4-1.7.  Creatinine 0.92.  Sodium 131.  Urinalysis indicative of infection.  CT abdomen pelvis with small bowel obstruction as well as possible proctitis. NGT placed in ER>  Review of Systems: As mentioned in the history of present illness. All other systems reviewed and are negative. Past Medical History:  Diagnosis Date   Acute hypoxic respiratory failure (HCC) 11/30/2022   Chronic kidney disease    Leukocytosis 11/30/2022   Multiple sclerosis, primary progressive (HCC)    Sepsis (HCC) 12/02/2022   Past Surgical History:  Procedure Laterality Date   BACK SURGERY     CHOLECYSTECTOMY     EYE SURGERY     Social History:  reports that he has never smoked. He has never been exposed to  tobacco smoke. He has never used smokeless tobacco. He reports that he does not drink alcohol and does not use drugs.  Allergies  Allergen Reactions   Cephalexin Anaphylaxis and Hives    Pt tolerated several doses of cefepime during hospitalization in 03/2021. Also received ceftriaxone, ampicillin/sulbactam and amoxicillin/clavulanate in past    History reviewed. No pertinent family history.  Prior to Admission medications   Medication Sig Start Date End Date Taking? Authorizing Provider  baclofen (LIORESAL) 20 MG tablet Take 20-40 mg by mouth 4 (four) times daily. 05/21/20  Yes [provider]  bisacodyl (DULCOLAX) 5 MG EC tablet Take 5 mg by mouth daily as needed for moderate constipation.   Yes [provider]  cetirizine (ZYRTEC) 10 MG tablet Take 10 mg by mouth every other day. Rotates this out with Claritin every other day   Yes [provider]  Cholecalciferol (D3-1000 PO) Take 1,000 Units by mouth daily.   Yes [provider]  clonazePAM (KLONOPIN) 1 MG tablet Take 1 mg by mouth 2 (two) times daily as needed. 08/31/22  Yes [provider]  docusate sodium (COLACE) 100 MG capsule Take 400 mg by mouth at bedtime.   Yes [provider]  gabapentin (NEURONTIN) 600 MG tablet Take 1,200 mg by mouth 3 (three) times daily. 02/14/19  Yes [provider]  guaiFENesin (MUCINEX) 600 MG 12 hr tablet Take 600 mg by mouth 2 (two) times daily as needed for cough or to loosen phlegm.   Yes [provider]  ibuprofen (ADVIL) 200 MG tablet Take 800 mg by  mouth in the morning and at bedtime.   Yes [provider]  loratadine (CLARITIN) 10 MG tablet Take 10 mg by mouth every other day.   Yes [provider]  magnesium oxide (MAG-OX) 400 MG tablet Take 1 tablet by mouth daily.   Yes [provider]  Melatonin 10 MG TABS Take 10 mg by mouth daily as needed.   Yes [provider]  MOVANTIK 25 MG TABS  tablet Take 25 mg by mouth daily. 05/21/20  Yes [provider]  Multiple Vitamin (MULTIVITAMIN WITH MINERALS) TABS tablet Take 1 tablet by mouth daily.   Yes [provider]  nystatin (MYCOSTATIN/NYSTOP) powder SMARTSIG:1 Application Topical 2-3 Times Daily 06/13/20  Yes [provider]  oxybutynin (DITROPAN-XL) 10 MG 24 hr tablet Take 1 tablet (10 mg total) by mouth 3 (three) times daily. 07/13/22 07/10/23 Yes MacDiarmid, Lorin Picket, MD  oxyCODONE (OXY IR/ROXICODONE) 5 MG immediate release tablet Take 5 mg by mouth every 4 (four) hours as needed for moderate pain (pain score 4-6) or severe pain (pain score 7-10). 03/13/21  Yes [provider]  phenylephrine (SUDAFED PE) 10 MG TABS tablet Take 10 mg by mouth every 4 (four) hours as needed.   Yes [provider]  Probiotic Product (PROBIOTIC-10 PO) Take 1 capsule by mouth daily.   Yes [provider]  Zinc Oxide-Vitamin C (ZINC PLUS VITAMIN C PO) Take 1 tablet by mouth 2 (two) times daily.   Yes [provider]  Pumpkin Seed 500 MG CAPS Take 1,000 mg by mouth daily. Patient not taking: Reported on 06/02/2023    [provider]    Physical Exam: Vitals:   06/02/23 0600 06/02/23 0630 06/02/23 0730 06/02/23 0826  BP: 135/84 (!) 131/93 134/86   Pulse: (!) 110 (!) 111 (!) 110   Resp: 11 12 14    Temp:  97.8 F (36.6 C)  98.1 F (36.7 C)  TempSrc:  Oral  Axillary  SpO2: 96% 98% 96%    Physical Exam Constitutional:      Comments: Mildly disshevled    HENT:     Head: Normocephalic and atraumatic.     Nose: Nose normal.     Comments: NGT in place      Mouth/Throat:     Mouth: Mucous membranes are moist.  Eyes:     Pupils: Pupils are equal, round, and reactive to light.  Cardiovascular:     Rate and Rhythm: Normal rate and regular rhythm.  Pulmonary:     Effort: Pulmonary effort is normal.  Abdominal:     General: Bowel sounds are normal.  Musculoskeletal:     Comments:  Bedbound in setting of advanced MS   Neurological:     Comments: Bedbound in setting of advanced MS   Psychiatric:        Mood and Affect: Mood normal.     Data Reviewed:  There are no new results to review at this time.  DG Abdomen 1 View Addendum: ADDENDUM REPORT: 06/02/2023 07:34   ADDENDUM:  These results were called by telephone at the time of interpretation  on 06/02/2023 at 7:33 am to provider Dr. Tonette Lederer, who verbally  acknowledged these results.   Electronically Signed    By: Signa Kell M.D.    On: 06/02/2023 07:34 Narrative: CLINICAL DATA:  Status post NG tube placement.  EXAM: ABDOMEN - 1 VIEW  COMPARISON:  CT from earlier today  FINDINGS: The enteric tube is looped within the esophagus. The  tip and side port are oriented towards the mouth within the proximal and mid esophagus. Recommend repositioning. Lung volumes are low. There is diffuse gaseous distension of the stomach and small bowel loops compatible with small bowel obstruction. Visualized osseous structures are normal. Cholecystectomy clips.  IMPRESSION: 1. The enteric tube is looped within the esophagus. The tip and side port are oriented towards the mouth within the proximal and mid esophagus. Recommend repositioning. 2. Diffuse gaseous distension of the stomach and small bowel loops compatible with small bowel obstruction.  Electronically Signed: By: Signa Kell M.D. On: 06/02/2023 07:02 CT ABDOMEN PELVIS W CONTRAST CLINICAL DATA:  Abdominal pain with nausea and vomiting  EXAM: CT ABDOMEN AND PELVIS WITH CONTRAST  TECHNIQUE: Multidetector CT imaging of the abdomen and pelvis was performed using the standard protocol following bolus administration of intravenous contrast.  RADIATION DOSE REDUCTION: This exam was performed according to the departmental dose-optimization program which includes automated exposure control, adjustment of the mA and/or kV according to patient size  and/or use of iterative reconstruction technique.  CONTRAST:  OMNIPAQUE IOHEXOL 300 MG/ML  SOLN  COMPARISON:  None Available.  FINDINGS: Lower Chest: Bibasilar atelectasis  Hepatobiliary: Multiple hypodensities within the liver, most consistent with hepatic cysts. Status post cholecystectomy.  Pancreas: Normal pancreas. No ductal dilatation or peripancreatic fluid collection.  Spleen: Multiple cystic densities within the spleen.  Adrenals/Urinary Tract: The adrenal glands are normal. No hydronephrosis, nephroureterolithiasis or solid renal mass.  Stomach/Bowel: There is no hiatal hernia. Normal duodenal course and caliber. Diffusely dilated and fluid-filled small bowel. Suspected transition point at the terminal ileum. There is mild rectal wall thickening. There is a large amount of distal colonic stool.  Vascular/Lymphatic: There is calcific atherosclerosis of the abdominal aorta. No lymphadenopathy.  Reproductive: Suprapubic catheter.  Normal sized prostate.  Other: None.  Musculoskeletal: No bony spinal canal stenosis or focal osseous abnormality.  IMPRESSION: 1. Diffusely dilated and fluid-filled small bowel with suspected transition point at the terminal ileum. Findings are consistent with small bowel obstruction. 2. Mild rectal wall thickening, which may indicate proctitis. 3. Large amount of distal colonic stool.  Electronically Signed   By: Deatra Robinson M.D.   On: 06/02/2023 04:02  Lab Results  Component Value Date   WBC 16.9 (H) 06/02/2023   HGB 17.7 (H) 06/02/2023   HCT 50.5 06/02/2023   MCV 90.0 06/02/2023   PLT 346 06/02/2023   Last metabolic panel Lab Results  Component Value Date   GLUCOSE 169 (H) 06/02/2023   NA 131 (L) 06/02/2023   K 4.2 06/02/2023   CL 104 06/02/2023   CO2 16 (L) 06/02/2023   BUN 27 (H) 06/02/2023   CREATININE 0.92 06/02/2023   GFRNONAA >60 06/02/2023   CALCIUM 7.7 (L) 06/02/2023   PHOS 2.7 04/19/2021   PROT  7.3 06/02/2023   ALBUMIN 3.2 (L) 06/02/2023   BILITOT 1.0 06/02/2023   ALKPHOS 53 06/02/2023   AST 32 06/02/2023   ALT 38 06/02/2023   ANIONGAP 11 06/02/2023    Assessment and Plan: * SBO (small bowel obstruction) (HCC) GI bleeding  Noted acute onset of abdominal pain with SBO on imaging  S/p NGT placement in ER- adjusted by general surgery  Fairly large amount of maroon-coffee ground output concerning for upper GI bleeding Noted regular high dose NSAID use (8 pills of IBF daily) vs.traumatic in setting of NGT placement  Hgb 17.7  Will trend  Transfuse for hgb < 7  IV PPI  Noted baseline  DNR status.  After discussion w/ patient, will consult palliative care to dicscuss overall goals of care given multiple comorbidities and likely high endoscopy for surgical intervention risk.  Case also preliminarily discussed w/ Dr. Norma Fredrickson. Available for consult as needed.    Flaccid neuropathic bladder, not elsewhere classified UTI Baseline SP cath in place s/p replacement within the past 24 hours  Noted concern for UTI WBC 17  UA indicative of infection  IV rocephin  Urine culture  Follow     Proctitis + proctitis on imaging w/ noted abdominal pain, SBO, UTI  WBC 17 Will add on flagyl for anaerobic coverage with concurrent rocephin use  Monitor    Hyponatremia Na 131 Near baseline    Sepsis (HCC) Meeting sepsis criteria based on HR 100s, WBC 17  Noted concern for UTI with baseline neurogenic bladder and SP cath in place- s/p replacement within the past 24 hours  Panculture IV rocephin  Trend lactate  LR IVF  Monitor    MS (multiple sclerosis) (HCC) Baseline History of primary progressive MS, currently bedbound. Cont  home oxycodone, baclofen and gabapentin  Stage IV decubitus ulcer (HCC) Wound care consult      Advance Care Planning:   Code Status: Limited: Do not attempt resuscitation (DNR) -DNR-LIMITED -Do Not Intubate/DNI    Consults: Palliative Care    Family Communication: Case discussed w/ family at the bedside as well as over the phone   Severity of Illness: The appropriate patient status for this patient is INPATIENT. Inpatient status is judged to be reasonable and necessary in order to provide the required intensity of service to ensure the patient's safety. The patient's presenting symptoms, physical exam findings, and initial radiographic and laboratory data in the context of their chronic comorbidities is felt to place them at high risk for further clinical deterioration. Furthermore, it is not anticipated that the patient will be medically stable for discharge from the hospital within 2 midnights of admission.   * I certify that at the point of admission it is my clinical judgment that the patient will require inpatient hospital care spanning beyond 2 midnights from the point of admission due to high intensity of service, high risk for further deterioration and high frequency of surveillance required.*  Author: Floydene Flock, MD 06/02/2023 9:57 AM  For on call review [email or web address].

## 2023-06-02 NOTE — Assessment & Plan Note (Addendum)
 Meeting sepsis criteria based on HR 100s, WBC 17  Noted concern for UTI with baseline neurogenic bladder and SP cath in place- s/p replacement within the past 24 hours  Panculture IV rocephin  Trend lactate  LR IVF  Monitor

## 2023-06-02 NOTE — ED Notes (Signed)
 Provided patient with mouth swab due to dry mouth, warm blanket given.

## 2023-06-02 NOTE — ED Provider Notes (Signed)
 [facility] Provider Note    Event Date/Time   First MD Initiated Contact with Patient 06/02/23 0130     (approximate)   History   Abdominal Pain and Emesis   HPI Ronald Santiago is a 78 y.o. male with history of multiple sclerosis, prior small bowel obstructions presenting today for abdominal pain and vomiting.  Patient states onset of symptoms yesterday afternoon with sharp abdominal pain.  Vomiting developed overnight into today and has been continuous.  No bowel movements in 48 hours.  They did exchange his suprapubic catheter as they thought that might have in the source but no improvement in symptoms.  He has had 600 cc of drainage in the last 24 hours.  Unable to take p.o.  Otherwise denies cough, congestion, chest pain, shortness of breath.  Prior history of bowel obstruction presenting similar to this.     Physical Exam   Triage Vital Signs: ED Triage Vitals  Encounter Vitals Group     BP      Systolic BP Percentile      Diastolic BP Percentile      Pulse      Resp      Temp      Temp src      SpO2      Weight      Height      Head Circumference      Peak Flow      Pain Score      Pain Loc      Pain Education      Exclude from Growth Chart     Most recent vital signs: Vitals:   06/02/23 0133  BP: (!) 121/96  Pulse: (!) 130  Resp: 18  Temp: 97.8 F (36.6 C)  SpO2: 91%    Physical Exam: I have reviewed the vital signs and nursing notes. General: Awake, alert, no acute distress.  Nontoxic appearing. Head:  Atraumatic, normocephalic.   ENT:  EOM intact, PERRL. Oral mucosa is pink and moist with no lesions. Neck: Neck is supple with full range of motion, No meningeal signs. Cardiovascular:  RRR, No murmurs. Peripheral pulses palpable and equal bilaterally. Respiratory:  Symmetrical chest wall expansion.  No rhonchi, rales, or wheezes.  Good air movement throughout.  No use of accessory muscles.   Musculoskeletal:  No cyanosis or  edema. Moving extremities with full ROM Abdomen:  Soft, distended, generalized tenderness palpation Neuro:  GCS 15, moving all four extremities, interacting appropriately. Speech clear. Psych:  Calm, appropriate.   Skin:  Warm, dry, no rash.    ED Results / Procedures / Treatments   Labs (all labs ordered are listed, but only abnormal results are displayed) Labs Reviewed  CBC WITH DIFFERENTIAL/PLATELET - Abnormal; Notable for the following components:      Result Value   WBC 16.9 (*)    Hemoglobin 17.7 (*)    Neutro Abs 15.7 (*)    All other components within normal limits  URINALYSIS, ROUTINE W REFLEX MICROSCOPIC - Abnormal; Notable for the following components:   Color, Urine AMBER (*)    APPearance TURBID (*)    Protein, ur 30 (*)    Leukocytes,Ua LARGE (*)    Bacteria, UA RARE (*)    All other components within normal limits  COMPREHENSIVE METABOLIC PANEL - Abnormal; Notable for the following components:   Sodium 131 (*)    CO2 16 (*)    Glucose, Bld 169 (*)    BUN  27 (*)    Calcium 7.7 (*)    Albumin 3.2 (*)    All other components within normal limits  LACTIC ACID, PLASMA - Abnormal; Notable for the following components:   Lactic Acid, Venous 2.3 (*)    All other components within normal limits  CULTURE, BLOOD (ROUTINE X 2)  CULTURE, BLOOD (ROUTINE X 2)  LIPASE, BLOOD  LACTIC ACID, PLASMA     EKG My EKG interpretation: Rate of 124, sinus tachycardia, normal axis, normal intervals.  No acute ST elevations or depressions   RADIOLOGY Independently interpreted CT abdomen/pelvis showing evidence of small bowel obstruction with transition in the terminal ileum   PROCEDURES:  Critical Care performed: Yes, see critical care procedure note(s)  .Critical Care  Performed by: Janith Lima, MD Authorized by: Janith Lima, MD   Critical care provider statement:    Critical care time (minutes):  30   Critical care was necessary to treat or prevent imminent or  life-threatening deterioration of the following conditions:  Sepsis   Critical care was time spent personally by me on the following activities:  Development of treatment plan with patient or surrogate, discussions with consultants, evaluation of patient's response to treatment, examination of patient, ordering and review of laboratory studies, ordering and review of radiographic studies, ordering and performing treatments and interventions, pulse oximetry, re-evaluation of patient's condition and review of old charts   I assumed direction of critical care for this patient from another provider in my specialty: no     Care discussed with: admitting provider      MEDICATIONS ORDERED IN ED: Medications  morphine (PF) 4 MG/ML injection 4 mg (4 mg Intravenous Given 06/02/23 0148)  ondansetron (ZOFRAN) injection 4 mg (4 mg Intravenous Given 06/02/23 0145)  sodium chloride 0.9 % bolus 1,000 mL (0 mLs Intravenous Stopped 06/02/23 0244)  aztreonam (AZACTAM) 2 g in sodium chloride 0.9 % 100 mL IVPB (2 g Intravenous New Bag/Given 06/02/23 0340)  iohexol (OMNIPAQUE) 300 MG/ML solution 100 mL (100 mLs Intravenous Contrast Given 06/02/23 0312)  fentaNYL (SUBLIMAZE) injection 50 mcg (50 mcg Intravenous Given 06/02/23 0323)  sodium chloride 0.9 % bolus 1,000 mL (1,000 mLs Intravenous New Bag/Given 06/02/23 0412)     IMPRESSION / MDM / ASSESSMENT AND PLAN / ED COURSE  I reviewed the triage vital signs and the nursing notes.                              Differential diagnosis includes, but is not limited to, small bowel obstruction, colitis, gastroenteritis, acute cystitis, sepsis  Patient's presentation is most consistent with acute presentation with potential threat to life or bodily function.  Patient is a 78 year old male presenting today for sharp generalized abdominal pain associate with vomiting x 24 hours and no bowel movement in 48 hours.  He is tachycardic on arrival but no hypotension or tachypnea.   Significant abdominal distention.  Patient initially given 1 L fluids, morphine, and Zofran for symptomatic treatment.  He was found to have a white blood cell count of 16.9 as well as a positive UA.  He was started on IV antibiotics and started on sepsis protocol has he met criteria at that time.  Eventually, patient was found to have a lactic of 2.6 and a CT abdomen/pelvis showing small bowel obstruction.  NG tube was placed.  General surgery consulted and agrees with NG tube placement and hydration.  Recommends admission to  medicine and will consult in the morning.  Patient admitted to hospitalist for further care.  The patient is on the cardiac monitor to evaluate for evidence of arrhythmia and/or significant heart rate changes.     FINAL CLINICAL IMPRESSION(S) / ED DIAGNOSES   Final diagnoses:  Small bowel obstruction (HCC)  Complicated UTI (urinary tract infection)  Sepsis, due to unspecified organism, unspecified whether acute organ dysfunction present [facility])     Rx / DC Orders   ED Discharge Orders     None        Note:  This document was prepared using Dragon voice recognition software and may include unintentional dictation errors.   Janith Lima, MD 06/02/23 [phone]

## 2023-06-02 NOTE — Assessment & Plan Note (Addendum)
 GI bleeding  Noted acute onset of abdominal pain with SBO on imaging  S/p NGT placement in ER- adjusted by general surgery  Fairly large amount of maroon-coffee ground output concerning for upper GI bleeding Noted regular high dose NSAID use (8 pills of IBF daily) vs.traumatic in setting of NGT placement  Hgb 17.7  Will trend  Transfuse for hgb < 7  IV PPI  Noted baseline DNR status.  After discussion w/ patient, will consult palliative care to dicscuss overall goals of care given multiple comorbidities and likely high endoscopy for surgical intervention risk.  Case also preliminarily discussed w/ Dr. Norma Fredrickson. Available for consult as needed.

## 2023-06-02 NOTE — Consult Note (Signed)
 Please note that the [facility] nursing team is utilizing a standardized work plan to manage patient consults. We are triaging consults and will try to see the patients within 48 hours. Wound photos in the patient's chart allow [place] to consult on the patient in the most efficient and timely manner.    Requested photo.   Ronald Santiago [facility], CNS, [employer] [phone]

## 2023-06-02 NOTE — Sepsis Progress Note (Signed)
 Elink monitoring for the code sepsis protocol.

## 2023-06-02 NOTE — Assessment & Plan Note (Signed)
 -  Wound care consult

## 2023-06-02 NOTE — ED Triage Notes (Signed)
 Ems reports that patient started vomiting "brown" emesis and diffuse abdominal pain since yesterday. Patient also complains of decreased urine output of in 24 hours. Wife also states that he has had minimal stool output as well.

## 2023-06-02 NOTE — ED Notes (Signed)
 This tech and Megan NT changed pt's linen and wiped pt's face with wash cloth. Pt now resting in bed at this time with call bell in reach.

## 2023-06-02 NOTE — Consult Note (Signed)
 [facility] SURGICAL CONSULTATION NOTE (initial) - cpt: 16109   HISTORY OF PRESENT ILLNESS (HPI):  78 y.o. male presented to [facility] ED yesterday for abdominal pain and emesis. Patient reports around a two day history of progressive abdominal pain, distension, nausea, and emesis. Pain was reportedly generalized in nature. No fever, chills. He does have a history of MS with indwelling foley and is fully bed bound. Previous abdominal surgery positive for cholecystectomy. He believes this was "30-40 years ago" but certainly has laparoscopic incisional scars. Work up in the ED revealed a leukocytosis to 16.9K, Hgb to 17.7 consistent with hemoconcentration, sCr - 0.92, hyponatremia to 131, venous lactate 2.3. CT Abdomen/Pelvis was obtained and consistent with SBO with potential transition to distal ileum.  When I saw him this AM, he was actively throwing up. This was very dark colored fluid. NGT was in left nare and on full regular suction. He had returned from XR just prior to my arrival. I reviewed this and NGT noted to be in distal esophagus. I was able to advance this another 5-10 cm with return of over 1500+ ccs of dark stagnant appearing fluid. There was some blood present in output as well initially.  Surgery is consulted by emergency medicine physician Dr. Claudell Kyle, MD in this context for evaluation and management of SBO.  PAST MEDICAL HISTORY (PMH):  Past Medical History:  Diagnosis Date   Acute hypoxic respiratory failure (HCC) 11/30/2022   Chronic kidney disease    Leukocytosis 11/30/2022   Multiple sclerosis, primary progressive (HCC)    Sepsis (HCC) 12/02/2022     PAST SURGICAL HISTORY (PSH):  Past Surgical History:  Procedure Laterality Date   BACK SURGERY     CHOLECYSTECTOMY     EYE SURGERY       MEDICATIONS:  Prior to Admission medications   Medication Sig Start Date End Date Taking? Authorizing Provider  baclofen (LIORESAL) 20 MG tablet Take 20-40 mg by  mouth 3 (three) times daily. (20 mg in morning, 20 mg at noon, and 40 mg at bedtime) 05/21/20   [provider]  carbamide peroxide (DEBROX) 6.5 % OTIC solution Place 5 drops into both ears 2 (two) times daily. 12/12/22   Pennie Banter, DO  cetirizine (ZYRTEC) 10 MG tablet Take 10 mg by mouth daily.    [provider]  chlorpheniramine-HYDROcodone (TUSSIONEX) 10-8 MG/5ML Take 5 mLs by mouth every 12 (twelve) hours as needed for cough. 12/12/22   Pennie Banter, DO  Cholecalciferol (D3-1000 PO) Take 1,000 Units by mouth daily.    [provider]  clonazePAM (KLONOPIN) 1 MG tablet Take 1 mg by mouth 2 (two) times daily as needed. 08/31/22   [provider]  docusate sodium (COLACE) 100 MG capsule Take 400 mg by mouth at bedtime.    [provider]  gabapentin (NEURONTIN) 600 MG tablet Take 1,200 mg by mouth 3 (three) times daily. 02/14/19   [provider]  ibuprofen (ADVIL) 200 MG tablet Take 800 mg by mouth in the morning and at bedtime.    [provider]  magnesium oxide (MAG-OX) 400 MG tablet Take 1 tablet by mouth daily.    [provider]  MOVANTIK 25 MG TABS tablet Take 25 mg by mouth daily. 05/21/20   [provider]  Multiple Vitamin (MULTIVITAMIN WITH MINERALS) TABS tablet Take 1 tablet by mouth daily.    [provider]  nystatin (MYCOSTATIN/NYSTOP) powder SMARTSIG:1 Application Topical 2-3 Times Daily 06/13/20  [provider]  oxybutynin (DITROPAN-XL) 10 MG 24 hr tablet Take 1 tablet (10 mg total) by mouth 3 (three) times daily. 07/13/22 07/10/23  Alfredo Martinez, MD  oxyCODONE (OXY IR/ROXICODONE) 5 MG immediate release tablet Take 5 mg by mouth every 4 (four) hours as needed for moderate pain or severe pain. Patient not taking: Reported on 11/30/2022 03/13/21   [provider]  polyethylene glycol (MIRALAX / GLYCOLAX) 17 g packet Take 17 g by mouth daily. 12/13/22   Pennie Banter,  DO  Probiotic Product (PROBIOTIC-10 PO) Take by mouth.    [provider]  Pumpkin Seed 500 MG CAPS Take 1,000 mg by mouth daily.    [provider]  senna-docusate (SENOKOT-S) 8.6-50 MG tablet Take 1 tablet by mouth 2 (two) times daily. 12/12/22   Pennie Banter, DO  Zinc Oxide-Vitamin C (ZINC PLUS VITAMIN C PO) Take 1 tablet by mouth 2 (two) times daily.    [provider]     ALLERGIES:  Allergies  Allergen Reactions   Cephalexin Anaphylaxis and Hives    Pt tolerated several doses of cefepime during hospitalization in 03/2021     SOCIAL HISTORY:  Social History   Socioeconomic History   Marital status: Married    Spouse name: Not on file   Number of children: Not on file   Years of education: Not on file   Highest education level: Not on file  Occupational History   Not on file  Tobacco Use   Smoking status: Never    Passive exposure: Never   Smokeless tobacco: Never  Substance and Sexual Activity   Alcohol use: Never   Drug use: Never   Sexual activity: Not on file  Other Topics Concern   Not on file  Social History Narrative   Not on file   Social Drivers of Health   Financial Resource Strain: Low Risk  (03/05/2023)   Received from [facility]   Overall Financial Resource Strain (CARDIA)    Difficulty of Paying Living Expenses: Not hard at all  Food Insecurity: No Food Insecurity (03/05/2023)   Received from [facility]   Hunger Vital Sign    Worried About Running Out of Food in the Last Year: Never true    Ran Out of Food in the Last Year: Never true  Transportation Needs: No Transportation Needs (03/05/2023)   Received from [facility] - Transportation    In the past 12 months, has lack of transportation kept you from medical appointments or from getting medications?: No    Lack of Transportation (Non-Medical): No  Physical Activity: Not on file  Stress: Not on  file  Social Connections: Unknown (08/05/2021)   Received from [facility]   Social Network    Social Network: Not on file  Intimate Partner Violence: Patient Unable To Answer (12/01/2022)   Humiliation, Afraid, Rape, and Kick questionnaire    Fear of Current or Ex-Partner: Patient unable to answer    Emotionally Abused: Patient unable to answer    Physically Abused: Patient unable to answer    Sexually Abused: Patient unable to answer     FAMILY HISTORY:  History reviewed. No pertinent family history.    REVIEW OF SYSTEMS:  Review of Systems  Constitutional:  Negative for chills and fever.  Respiratory:  Negative for cough and shortness of breath.   Cardiovascular:  Negative for chest pain and palpitations.  Gastrointestinal:  Positive for abdominal pain, nausea and vomiting. Negative for constipation and diarrhea.  All other systems reviewed and are negative.   VITAL SIGNS:  Temp:  [97.8 F (36.6 C)] 97.8 F (36.6 C) (03/12 0630) Pulse Rate:  [100-130] 111 (03/12 0630) Resp:  [9-22] 12 (03/12 0630) BP: (107-135)/(82-96) 131/93 (03/12 0630) SpO2:  [91 %-98 %] 98 % (03/12 0630)             INTAKE/OUTPUT:  03/11 0701 - 03/12 0700 In: -  Out: 200 [Urine:200]  PHYSICAL EXAM:  Physical Exam Vitals and nursing note reviewed. Exam conducted with a chaperone present.  Constitutional:      General: He is not in acute distress.    Appearance: He is well-developed.  HENT:     Head: Normocephalic and atraumatic.  Eyes:     General: No scleral icterus.    Extraocular Movements: Extraocular movements intact.  Cardiovascular:     Rate and Rhythm: Tachycardia present.     Heart sounds: Normal heart sounds. No murmur heard.    Comments: Initially tachycardic to 120 on arrival, improved to 105 bom Pulmonary:     Effort: Pulmonary effort is normal. No respiratory distress.     Comments: On Gerald Abdominal:     General: Abdomen is flat. A surgical scar is present.  There is distension.     Palpations: Abdomen is soft.     Tenderness: There is generalized abdominal tenderness. There is no guarding or rebound.     Hernia: No hernia is present.     Comments: Abdomen is soft, relatively generalized soreness difficult to localize, he is distended, no rebound/guarding. He is not overtly peritonitic   Genitourinary:    Comments: Suprapubic catheter present; good UO  Skin:    General: Skin is warm and dry.     Coloration: Skin is not jaundiced.  Neurological:     General: No focal deficit present.     Mental Status: He is alert and oriented to person, place, and time.      Labs:     Latest Ref Rng & Units 06/02/2023    1:35 AM 12/12/2022    4:15 AM 12/09/2022    3:11 AM  CBC  WBC 4.0 - 10.5 K/uL 16.9  7.6  6.2   Hemoglobin 13.0 - 17.0 g/dL 16.1  09.6  04.5   Hematocrit 39.0 - 52.0 % 50.5  33.1  31.1   Platelets 150 - 400 K/uL 346  375  394       Latest Ref Rng & Units 06/02/2023    2:24 AM 12/09/2022    3:11 AM 12/08/2022    4:22 AM  CMP  Glucose 70 - 99 mg/dL 409  811  914   BUN 8 - 23 mg/dL 27  12  9    Creatinine 0.61 - 1.24 mg/dL 7.82  9.56  2.13   Sodium 135 - 145 mmol/L 131  134  134   Potassium 3.5 - 5.1 mmol/L 4.2  4.6  4.3   Chloride 98 - 111 mmol/L 104  95  97   CO2 22 - 32 mmol/L 16  30  32   Calcium 8.9 - 10.3 mg/dL 7.7  8.4  8.4   Total Protein 6.5 - 8.1 g/dL 7.3     Total Bilirubin 0.0 - 1.2 mg/dL 1.0     Alkaline Phos 38 - 126 U/L 53     AST 15 - 41 U/L 32     ALT  0 - 44 U/L 38       Imaging studies:   CT Abdomen/Pelvis (06/01/2023) personally reviewed with distension of stomach and small bowel consistent with SBO, no pneumatosis, no free air, and radiologist report reviewed below:  IMPRESSION: 1. Diffusely dilated and fluid-filled small bowel with suspected transition point at the terminal ileum. Findings are consistent with small bowel obstruction. 2. Mild rectal wall thickening, which may indicate proctitis. 3. Large  amount of distal colonic stool.   Assessment/Plan: (ICD-10's: K28.609) 78 y.o. male with abdominal pain, nausea, and emesis concerning for SBO, complicated by pertinent comorbidities including MS.   - Appreciate medicine admission  - NGT advanced at bedside this morning with return of 1500+ ccs of stagnant appearing fluid. Continue LIS; monitor and record output   - Overall clinically appearance is not clear cut. He does have previous surgeries but these appear to have been laparoscopic and certainly lower risk for scar tissue. His MS also complicates things. I do not think he needs any emergent intervention right now but understands if he fails to improve or deteriorates we may need to consider this sooner.   - He also wishes to discuss with palliative care regarding goals of care if he were to need surgery, which I agree is reasonable  - He will need to be NPO; IVF support - Agree with PPI for prophylaxis  - Monitor abdominal examination; on-going bowel function  - Serial KUB; low threshold to get gastrografin challenge  - Further management per primary service; we will follow   All of the above findings and recommendations were discussed with the patient, and all of patient's questions were answered to his expressed satisfaction.  Thank you for the opportunity to participate in this patient's care.   -- Lynden Oxford, PA-C [facility] 06/02/2023, 7:19 AM M-F: 7am - 4pm

## 2023-06-02 NOTE — ED Notes (Signed)
 NG tube removed and new one replaced due to xray findings.

## 2023-06-02 NOTE — Sepsis Progress Note (Signed)
 Notified provider of need to order repeat lactic acid.

## 2023-06-02 NOTE — Assessment & Plan Note (Signed)
+   proctitis on imaging w/ noted abdominal pain, SBO, UTI  WBC 17 Will add on flagyl for anaerobic coverage with concurrent rocephin use  Monitor

## 2023-06-02 NOTE — ED Notes (Signed)
 Wife Angelique Blonder) was called by this RN and update given. All questions answered.

## 2023-06-02 NOTE — ED Notes (Signed)
 Pt provided warm blanket repositioned urinal emptied NG suction container emptied pt repositioned x3 pt denies further needs @ this time 20g PIV appears wdi patent secured infusing IVF .9 NS maintenance fluid without infiltration @ this time pt previously c/o returning abd pain pain relieving medication order provided pt denies pain@ this time resolved with pain medication admin

## 2023-06-02 NOTE — Sepsis Progress Note (Signed)
 Confirmed with bedside RN. Blood cultures collected prior to antibiotics administration.

## 2023-06-02 NOTE — Assessment & Plan Note (Signed)
 Baseline History of primary progressive MS, currently bedbound. Cont  home oxycodone, baclofen and gabapentin

## 2023-06-02 NOTE — Assessment & Plan Note (Addendum)
 UTI Baseline SP cath in place s/p replacement within the past 24 hours  Noted concern for UTI WBC 17  UA indicative of infection  IV rocephin  Urine culture  Follow

## 2023-06-02 NOTE — Consult Note (Signed)
 WOC consulted for Stage 4 Pressure Injury; made ordering MD and ED nurse aware no WOC nursing on [facility] campus today, will need picture for consultation.  Will request air mattress in the presence of Stage 4 PI.   Jamika Sadek [facility], CNS, [employer] [phone]

## 2023-06-02 NOTE — Progress Notes (Signed)
 CODE SEPSIS - PHARMACY COMMUNICATION  **Broad Spectrum Antibiotics should be administered within 1 hour of Sepsis diagnosis**  Time Code Sepsis Called/Page Received: 3/12 @ 0255  Antibiotics Ordered: Aztreonam  Time of 1st antibiotic administration: Aztreonam 2 gm IV x 1 on 3/12 @ 0340  Additional action taken by pharmacy:   If necessary, Name of Provider/Nurse Contacted:     Samanthan Dugo D ,PharmD Clinical Pharmacist  06/02/2023  4:47 AM

## 2023-06-02 NOTE — ED Notes (Signed)
 Air mattress order placed by Diplomatic Services operational officer

## 2023-06-03 ENCOUNTER — Inpatient Hospital Stay

## 2023-06-03 ENCOUNTER — Other Ambulatory Visit: Payer: Self-pay

## 2023-06-03 DIAGNOSIS — K56609 Unspecified intestinal obstruction, unspecified as to partial versus complete obstruction: Principal | ICD-10-CM

## 2023-06-03 LAB — COMPREHENSIVE METABOLIC PANEL
ALT: 33 U/L (ref 0–44)
AST: 40 U/L (ref 15–41)
Albumin: 3.2 g/dL — ABNORMAL LOW (ref 3.5–5.0)
Alkaline Phosphatase: 52 U/L (ref 38–126)
Anion gap: 8 (ref 5–15)
BUN: 22 mg/dL (ref 8–23)
CO2: 24 mmol/L (ref 22–32)
Calcium: 8.1 mg/dL — ABNORMAL LOW (ref 8.9–10.3)
Chloride: 104 mmol/L (ref 98–111)
Creatinine, Ser: 0.74 mg/dL (ref 0.61–1.24)
GFR, Estimated: >60 mL/min (ref >60)
Glucose, Bld: 126 mg/dL — ABNORMAL HIGH (ref 70–99)
Potassium: 3.9 mmol/L (ref 3.5–5.1)
Sodium: 136 mmol/L (ref 135–145)
Total Bilirubin: 0.8 mg/dL (ref 0.0–1.2)
Total Protein: 7.2 g/dL (ref 6.5–8.1)

## 2023-06-03 LAB — HEMOGLOBIN AND HEMATOCRIT, BLOOD
HCT: 37.3 % — ABNORMAL LOW (ref 39.0–52.0)
HCT: 40.1 % (ref 39.0–52.0)
Hemoglobin: 12.3 g/dL — ABNORMAL LOW (ref 13.0–17.0)
Hemoglobin: 13.2 g/dL (ref 13.0–17.0)

## 2023-06-03 MED ORDER — MORPHINE SULFATE (PF) 2 MG/ML IV SOLN
1.0000 mg | INTRAVENOUS | Status: DC | PRN
  Administered 2023-06-04: 1 mg via INTRAVENOUS
  Filled 2023-06-03: qty 1

## 2023-06-03 MED ORDER — OXYCODONE HCL 20 MG/ML PO CONC
5.0000 mg | Freq: Three times a day (TID) | ORAL | Status: DC
  Administered 2023-06-03 – 2023-06-08 (×15): 5 mg via SUBLINGUAL
  Filled 2023-06-03 (×15): qty 0.5

## 2023-06-03 MED ORDER — SMOG ENEMA
960.0000 mL | Freq: Once | RECTAL | Status: AC
  Administered 2023-06-03: 960 mL via RECTAL
  Filled 2023-06-03: qty 960

## 2023-06-03 MED ORDER — SODIUM CHLORIDE 0.9 % IV SOLN: INTRAVENOUS | Status: DC

## 2023-06-03 MED ORDER — FLUTICASONE PROPIONATE 50 MCG/ACT NA SUSP
2.0000 | Freq: Every day | NASAL | Status: DC
  Administered 2023-06-04 – 2023-06-10 (×7): 2 via NASAL
  Filled 2023-06-03: qty 16

## 2023-06-03 MED ORDER — DIAZEPAM 5 MG/ML IJ SOLN
5.0000 mg | Freq: Every day | INTRAMUSCULAR | Status: DC
  Administered 2023-06-03 – 2023-06-07 (×5): 5 mg via INTRAVENOUS
  Filled 2023-06-03 (×5): qty 2

## 2023-06-03 MED ORDER — ENOXAPARIN SODIUM 40 MG/0.4ML IJ SOSY
40.0000 mg | PREFILLED_SYRINGE | INTRAMUSCULAR | Status: DC
  Administered 2023-06-03 – 2023-06-10 (×8): 40 mg via SUBCUTANEOUS
  Filled 2023-06-03 (×8): qty 0.4

## 2023-06-03 NOTE — Plan of Care (Signed)
  Problem: Education: Goal: Knowledge of General Education information will improve Description: Including pain rating scale, medication(s)/side effects and non-pharmacologic comfort measures Outcome: Progressing   Problem: Clinical Measurements: Goal: Will remain free from infection Outcome: Progressing   Problem: Elimination: Goal: Will not experience complications related to bowel motility Outcome: Progressing Goal: Will not experience complications related to urinary retention Outcome: Progressing   Problem: Pain Managment: Goal: General experience of comfort will improve and/or be controlled Outcome: Progressing   Problem: Safety: Goal: Ability to remain free from injury will improve Outcome: Progressing   Problem: Skin Integrity: Goal: Risk for impaired skin integrity will decrease Outcome: Progressing   Problem: Education: Goal: Ability to identify signs and symptoms of gastrointestinal bleeding will improve Outcome: Progressing   Problem: Bowel/Gastric: Goal: Will show no signs and symptoms of gastrointestinal bleeding Outcome: Progressing   Problem: Fluid Volume: Goal: Will show no signs and symptoms of excessive bleeding Outcome: Progressing

## 2023-06-03 NOTE — Progress Notes (Signed)
 PROGRESS NOTE    Meghan Tiemann  WUJ:[id number] DOB: Jan 21, 1946 DOA: 06/02/2023 PCP: Mick Sell, MD  107A/107A-AA  LOS: 1 day   Brief hospital course:   Assessment & Plan: Ronald Santiago is a 78 y.o. 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 Pt reports progression of moderate to severe generalized abdominal pain  and vomiting since yesterday. No fevers or chills.  Baseline progressive MS-fully bedbound.  No reports of falls or recent trauma.  Does take high-dose ibuprofen daily-roughly 8 tablets.    * SBO (small bowel obstruction) (HCC) Noted acute onset of abdominal pain with SBO on imaging  S/p NGT placement in ER- adjusted by general surgery  --cont NG tube to intermittent suctioning --cont MIVF --enema to help clear Large amount of distal colonic stool.   GI bleeding  Fairly large amount of maroon-coffee ground output concerning for upper GI bleeding Noted regular high dose NSAID use (8 pills of IBF daily) vs.traumatic in setting of NGT placement  Hgb 17.7  Transfuse for hgb < 7  --cont IV PPI for now  Flaccid neuropathic bladder, not elsewhere classified UTI Baseline SP cath in place s/p replacement within the past 24 hours  Noted concern for UTI WBC 17  UA indicative of infection  IV rocephin  Urine culture  Follow   Proctitis + proctitis on imaging w/ noted abdominal pain, SBO, UTI  WBC 17 --d/c flagyl  Chronic constipation --enema   Chronic Hyponatremia Na 131 Near baseline    Sepsis (HCC) Meeting sepsis criteria based on HR 100s, WBC 17  Noted concern for UTI with baseline neurogenic bladder and SP cath in place- s/p replacement within the past 24 hours  Panculture IV rocephin  Trend lactate  LR IVF  Monitor    MS  (multiple sclerosis) (HCC) Baseline History of primary progressive MS, currently bedbound. --cont home oxycodone as SL  Stage IV decubitus ulcer (HCC) Wound care consult   DVT prophylaxis: Lovenox SQ Code Status: DNR  Family Communication:  Level of care: Telemetry Medical Dispo:   The patient is from: home Anticipated d/c is to: home Anticipated d/c date is: to be determined   Subjective and Interval History:  Pt reported chronic constipation.   Objective: Vitals:   06/03/23 0305 06/03/23 0743 06/03/23 0743 06/03/23 1715  BP: 111/69 115/79 115/79 126/70  Pulse: 93 91 60 (!) 103  Resp: 17 18 18 20   Temp: 97.9 F (36.6 C) (!) 97.3 F (36.3 C) (!) 97.3 F (36.3 C) 98.8 F (37.1 C)  TempSrc:    Oral  SpO2: 99% 98% 98% 96%  Weight: 79.7 kg     Height: 6' (1.829 m)       Intake/Output Summary (Last 24 hours) at 06/03/2023 1938 Last data filed at 06/03/2023 1844 Gross per 24 hour  Intake 1497.47 ml  Output --  Net 1497.47 ml   Filed Weights   06/03/23 0305  Weight: 79.7 kg    Examination:   Constitutional: NAD, AAOx3 HEENT: conjunctivae and lids normal, EOMI CV: No cyanosis.   RESP: normal respiratory effort Abdomen: NG tube present with active output Neuro: II - XII grossly intact.   Psych: Normal mood and affect.  Appropriate judgement and reason   Data Reviewed: I have personally reviewed labs and imaging studies  Time spent: 50 minutes  Darlin Priestly, MD [facility] If 7PM-7AM, please contact night-coverage 06/03/2023, 7:38 PM

## 2023-06-03 NOTE — Progress Notes (Signed)
 [facility] SURGICAL PROGRESS NOTE (cpt [phone])  Hospital Day(s): 1.  Interval History: Patient seen and examined, no acute events or new complaints overnight. Patient reports he is feeling better this morning. He reports his abdominal pain has improved and he feels less distended. No fever, chills, nausea. NGT with 900 ccs out recorded in last 24 hours; this still appears quite dark.KUB still with marked small bowel distension as well. He has failed gastrografin challenge. He does report passing flatus however.   Review of Systems:  Constitutional: denies fever, chills  HEENT: denies cough or congestion  Respiratory: denies any shortness of breath  Cardiovascular: denies chest pain or palpitations  Gastrointestinal: denies abdominal pain, N/V Genitourinary: denies burning with urination or urinary frequency Musculoskeletal: denies pain, decreased motor or sensation  Vital signs in last 24 hours: [min-max] current  Temp:  [97.3 F (36.3 C)-98.1 F (36.7 C)] 97.3 F (36.3 C) (03/13 0743) Pulse Rate:  [60-98] 60 (03/13 0743) Resp:  [9-25] 18 (03/13 0743) BP: (109-140)/(60-84) 115/79 (03/13 0743) SpO2:  [95 %-99 %] 98 % (03/13 0743) Weight:  [79.7 kg] 79.7 kg (03/13 0305)     Height: 6' (182.9 cm) Weight: 79.7 kg BMI (Calculated): 23.82   Intake/Output last 2 shifts:  03/12 0701 - 03/13 0700 In: 1000 [I.V.:1000] Out: 900    Physical Exam:  Constitutional: alert, cooperative and no distress  HENT: normocephalic without obvious abnormality; NGT in place; output still dark fluid  Eyes: PERRL, EOM's grossly intact and symmetric  Respiratory: breathing non-labored at rest  Cardiovascular: regular rate and sinus rhythm  Gastrointestinal: Abdomen is certainly softer this morning, distension improved, he does not appear overtly tender, no rebound/guarding. He is certainly not peritonitic.     Labs:     Latest Ref Rng & Units 06/03/2023    3:02 AM 06/03/2023   12:58 AM  06/02/2023    3:40 PM  CBC  Hemoglobin 13.0 - 17.0 g/dL 11.9  14.7  82.9   Hematocrit 39.0 - 52.0 % 40.1  37.3  40.0       Latest Ref Rng & Units 06/03/2023    3:02 AM 06/02/2023    2:24 AM 12/09/2022    3:11 AM  CMP  Glucose 70 - 99 mg/dL 562  130  865   BUN 8 - 23 mg/dL 22  27  12    Creatinine 0.61 - 1.24 mg/dL 7.84  6.96  2.95   Sodium 135 - 145 mmol/L 136  131  134   Potassium 3.5 - 5.1 mmol/L 3.9  4.2  4.6   Chloride 98 - 111 mmol/L 104  104  95   CO2 22 - 32 mmol/L 24  16  30    Calcium 8.9 - 10.3 mg/dL 8.1  7.7  8.4   Total Protein 6.5 - 8.1 g/dL 7.2  7.3    Total Bilirubin 0.0 - 1.2 mg/dL 0.8  1.0    Alkaline Phos 38 - 126 U/L 52  53    AST 15 - 41 U/L 40  32    ALT 0 - 44 U/L 33  38       Imaging studies:   KUB (06/03/2023) personally reviewed still with marked gastric and small bowel distension, contrast in stomach, and radiologist report reviewed below:  IMPRESSION: Unchanged gas dilated small bowel. No significant progression of contrast from the stomach.  Assessment/Plan: (ICD-10's: K35.609) 78 y.o. male with abdominal pain, nausea, and emesis concerning for SBO, complicated by pertinent comorbidities  including MS.   - Had a long discussion with patient this morning regarding his presentation, work up, and current examination. Our concern is that he has only had 1 laparoscopic surgery which should not create significant scarring to present with such dilated bowel. I do worry that we may need to explore him. This may also be a sequela of his MS; difficult to tell. Fortunately, he is not peritonitic and abdominal examination is actually improved. I do not think we need to emergently go to the OR. He would like time to discuss with his wife, but he is lean towards not undergoing surgery, we will follow up    - For now, would continue NPO - Continue NGT to LIS; monitor and record output - Monitor abdominal examination; on-going bowel function   - Pain control prn;  antiemetics prn   - Further management per primary service; we will follow    All of the above findings and recommendations were discussed with the patient, and the medical team, and all of patient's questions were answered to his expressed satisfaction.  -- Lynden Oxford, PA-C [facility] 06/03/2023, 7:50 AM M-F: 7am - 4pm

## 2023-06-03 NOTE — Consult Note (Signed)
 WOC Nurse Consult Note: Reason for Consult: sacral wound, history of Stage 4 pressure injury; bed bound  Wound type: Pressure Injury; Stage 4; healing; 100% pink, clean Abrasions/skin tear LLE; partial thickness; 100% pink, some dark purple as well, blood under the skin  Pressure Injury POA: Yes Measurement:see nursing flow sheet Wound bed: see above  Drainage (amount, consistency, odor) see nursing flow sheet Periwound: intact  Dressing procedure/placement/frequency:  Cleanse LE wounds with saline, pat dry Cover with single layer of xeroform gauze, top with foam Change every other day    Cleanse sacral wound with [place] Fantasia [place] # [phone]), pat dry Cut silver hydrofiber (Aquacel Ag+) [place] [phone], place in wound bed, top with dry dressing Change every day   Low air loss mattress ordered while in the ED yesterday for moisture management and pressure redistribution    Discussed POC with patient and bedside nurse.  Re consult if needed, will not follow at this time. Thanks  Stark Aguinaga [employer], RN,CWOCN, CNS, CWON-AP [phone])

## 2023-06-04 ENCOUNTER — Other Ambulatory Visit: Payer: Self-pay

## 2023-06-04 ENCOUNTER — Inpatient Hospital Stay

## 2023-06-04 DIAGNOSIS — K56609 Unspecified intestinal obstruction, unspecified as to partial versus complete obstruction: Secondary | ICD-10-CM | POA: Diagnosis not present

## 2023-06-04 LAB — CBC
HCT: 34.4 % — ABNORMAL LOW (ref 39.0–52.0)
Hemoglobin: 11.3 g/dL — ABNORMAL LOW (ref 13.0–17.0)
MCH: 30.8 pg (ref 26.0–34.0)
MCHC: 32.8 g/dL (ref 30.0–36.0)
MCV: 93.7 fL (ref 80.0–100.0)
Platelets: 220 10*3/uL (ref 150–400)
RBC: 3.67 MIL/uL — ABNORMAL LOW (ref 4.22–5.81)
RDW: 14.3 % (ref 11.5–15.5)
WBC: 5.9 10*3/uL (ref 4.0–10.5)
nRBC: 0 % (ref 0.0–0.2)

## 2023-06-04 LAB — BASIC METABOLIC PANEL
Anion gap: 12 (ref 5–15)
BUN: 20 mg/dL (ref 8–23)
CO2: 21 mmol/L — ABNORMAL LOW (ref 22–32)
Calcium: 8.1 mg/dL — ABNORMAL LOW (ref 8.9–10.3)
Chloride: 108 mmol/L (ref 98–111)
Creatinine, Ser: 0.5 mg/dL — ABNORMAL LOW (ref 0.61–1.24)
GFR, Estimated: >60 mL/min (ref >60)
Glucose, Bld: 106 mg/dL — ABNORMAL HIGH (ref 70–99)
Potassium: 3.6 mmol/L (ref 3.5–5.1)
Sodium: 141 mmol/L (ref 135–145)

## 2023-06-04 LAB — MAGNESIUM: Magnesium: 2.3 mg/dL (ref 1.7–2.4)

## 2023-06-04 MED ORDER — DEXTROSE 5 % IV SOLN: INTRAVENOUS | Status: AC

## 2023-06-04 MED ORDER — FENTANYL CITRATE PF 50 MCG/ML IJ SOSY
12.5000 ug | PREFILLED_SYRINGE | INTRAMUSCULAR | Status: DC | PRN
  Administered 2023-06-06 – 2023-06-10 (×4): 12.5 ug via INTRAVENOUS
  Filled 2023-06-04 (×4): qty 1

## 2023-06-04 MED ORDER — SMOG ENEMA
960.0000 mL | Freq: Once | RECTAL | Status: AC
  Administered 2023-06-04: 960 mL via RECTAL
  Filled 2023-06-04: qty 960

## 2023-06-04 NOTE — Progress Notes (Signed)
 Initial Nutrition Assessment  DOCUMENTATION CODES:   Not applicable  INTERVENTION:   -TPN management per pharmacy -Recommend 100 mg thiamine daily x 7 days -Recommend MVI daily -Monitor Mg, K, and Phos and replete as needed secondary to high refeeding risk   NUTRITION DIAGNOSIS:   Inadequate oral intake related to altered GI function as evidenced by NPO status.  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  Pt admitted with abdominal pain, nausea, and emesis concerning for SBO.   3/12- NGT placement 3/14- x-ray reveals dilated SB but there is air in colon, indicating a partial obstruction   Reviewed I/O's: -503 ml x 24 hours and -603 ml since admission  UOP: 1 L x 24 hours  Per [facility] notes, pt with stage IV pressure injury to sacrum.   Per general surgery notes, pt reports having some flatus. Plan to continue NPO and NGT due to KUB results and lack of reliable bowel function. No plans for emergent surgical intervention. Surgical history is not overly extensive (laparoscopic cholecystectomy x 1). Pt is a high risk surgical candidate.   For now, continue medical treatment, however, unsure if pt will pursue surgical intervention. Case discussed with pharmacist; plan to start TPN and place PICC tomorrow.   Wt has been stable over the past year.   Highly suspect malnutrition, however, unable to assess at this time. Pt is at high refeeding risk  Palliative care following for goals of care discussions.   Medications reviewed and include lovenox, protonix, and dextrose 5% solution @ 75  ml/hr.    Labs reviewed.   Diet Order:   Diet Order             Diet NPO time specified  Diet effective now                   EDUCATION NEEDS:   Not appropriate for education at this time  Skin:  Skin Assessment: Skin Integrity Issues: Skin Integrity Issues:: Other (Comment), Stage IV Stage IV: sacrum Other: skin tear to lt pretibial  Last BM:  06/04/23 (type 2)  Height:   Ht Readings from Last 1 Encounters:  06/03/23 6' (1.829 m)    Weight:   Wt Readings from Last 1 Encounters:  06/03/23 79.7 kg    Ideal Body Weight:  80.9 kg  BMI:  Body mass index is 23.83 kg/m.  Estimated Nutritional Needs:   Kcal:  2100-2300  Protein:  120-135 grams  Fluid:  2-2.2 L    Levada Schilling, RD, LDN, CDCES Registered Dietitian III Certified Diabetes Care and Education Specialist If unable to reach this RD, please use "RD Inpatient" group chat on secure chat between hours of 8am-4 pm daily

## 2023-06-04 NOTE — Plan of Care (Signed)
  Problem: Education: Goal: Knowledge of General Education information will improve Description: Including pain rating scale, medication(s)/side effects and non-pharmacologic comfort measures Outcome: Progressing   Problem: Activity: Goal: Risk for activity intolerance will decrease Outcome: Progressing   Problem: Nutrition: Goal: Adequate nutrition will be maintained Outcome: Progressing   Problem: Coping: Goal: Level of anxiety will decrease Outcome: Progressing   Problem: Elimination: Goal: Will not experience complications related to bowel motility Outcome: Progressing   Problem: Pain Managment: Goal: General experience of comfort will improve and/or be controlled Outcome: Progressing   Problem: Safety: Goal: Ability to remain free from injury will improve Outcome: Progressing   Problem: Skin Integrity: Goal: Risk for impaired skin integrity will decrease Outcome: Progressing   Problem: Bowel/Gastric: Goal: Will show no signs and symptoms of gastrointestinal bleeding Outcome: Progressing   Problem: Fluid Volume: Goal: Will show no signs and symptoms of excessive bleeding Outcome: Progressing

## 2023-06-04 NOTE — Progress Notes (Signed)
 I had an extensive d/w pt, wife and family. Discussed with them in detail. Pt is in a very difficult position. He is malnourished, has had failure to thrive, he is bed bound and has very high risk of developing perioperative complications from wound infection evisceration, leaks, fistulas to resp failure and even death. THey have a lot to think. [name] even hesitant about the initiation of TPN. For now they are thinking about different options but want to continue medical treatment., I spent no less than 40 minutes in the discussion along w Mr Ronald Santiago who also was able to help [place] in the discussion with the pt and family. They also have a pending call with palliative to seek more clarity

## 2023-06-04 NOTE — Progress Notes (Signed)
 Spoke with primary RN regarding PICC placement. Made aware PICC will be placed 3/15. IVT consult to be placed if further access is needed.

## 2023-06-04 NOTE — Plan of Care (Signed)

## 2023-06-04 NOTE — Progress Notes (Signed)
 Progress Note Dr Everlene Farrier and myself spent 45 minute at bedside this afternoon with the patient, his wife, and two family members discussing his presentation, current work up, diagnosis, and management options.   Again, he is certainly presenting like a bowel obstruction, potentially partial in nature. However, his surgical history is not overtly extensive (only laparoscopic cholecystectomy) to explain extensive adhesive disease. Additionally, his MS may be playing a role in functional aspect of his bowel. Regardless, he is without evidence of peritonitis, perforation, or pneumatosis to warrant emergent surgical intervention  We discussed role for surgery in this setting including need for this to be in open (ie: exploratory laparotomy) fashion. They understand there will be significant risks associated with this, especially given his MS and malnutrition, including, but not limited to, need for ventilator dependence post-operatively, ICU stay, infection, wound dehiscence, need for ostomy, further functional decline, and peri-operative death.   We also discussed potential for placing PICC and starting TPN tomorrow (03/15) to provide nutrition in the interim. Patient, and his wife, are agreeable with this. I have placed order for this as well.   We encouraged continued discussion with Palliative medicine to establish goals of care and expectation in this setting, as well as, moving forward given his comorbid disease. They were expecting to meet with their service again this afternoon  To summarize...  -- No need for emergent surgical intervention. He is at significant risk for complications and peri-operative morbidity  -- Will consult for placement of PICC -- Will initiate TPN -- Continued discussion with palliative care to establish GOC -- Can get serial KUB for objective monitoring  -- We will continue to follow along   -- Lynden Oxford, PA-C [facility] 06/04/2023, 2:43  PM M-F: 7am - 4pm

## 2023-06-04 NOTE — Progress Notes (Signed)
  PROGRESS NOTE    Ronald Santiago  WUJ:[id number] DOB: 29-Apr-1945 DOA: 06/02/2023 PCP: Mick Sell, MD  [id number]  LOS: 2 days   Brief hospital course:   Assessment & Plan: [name] is a 78 y.o. 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 Pt reports progression of moderate to severe generalized abdominal pain  and vomiting since yesterday. No fevers or chills.  Baseline progressive MS-fully bedbound.  No reports of falls or recent trauma.  Does take high-dose ibuprofen daily-roughly 8 tablets.    * SBO (small bowel obstruction) (HCC) Noted acute onset of abdominal pain with SBO on imaging  S/p NGT placement in ER- adjusted by general surgery  --cont NG tube --cont MIVF --enema to help clear Large amount of distal colonic stool.   GI bleeding  Fairly large amount of maroon-coffee ground output concerning for upper GI bleeding Noted regular high dose NSAID use (8 pills of IBF daily) vs.traumatic in setting of NGT placement  Hgb 17.7  Transfuse for hgb < 7  --cont IV PPI for now  Flaccid neuropathic bladder, not elsewhere classified UTI Baseline SP cath in place s/p replacement within the past 24 hours  Noted concern for UTI WBC 17  UA indicative of infection  --cont ceftriaxone pending urine cx  Proctitis + proctitis on imaging --d/c'ed flagyl  Chronic constipation --enema x2   Chronic Hyponatremia Na 131 Near baseline    Sepsis (HCC) Meeting sepsis criteria based on HR 100s, WBC 17  Noted concern for UTI with baseline neurogenic bladder and SP cath in place- s/p replacement within the past 24 hours  Panculture IV rocephin  Trend lactate  LR IVF  Monitor    MS (multiple sclerosis) (HCC) Baseline History of primary  progressive MS, currently bedbound. --cont home oxycodone as SL --fentanyl PRN  Stage IV decubitus ulcer (HCC) Wound care consult   DVT prophylaxis: Lovenox SQ Code Status: DNR  Family Communication: family updated at bedside today Level of care: Telemetry Medical Dispo:   The patient is from: home Anticipated d/c is to: home Anticipated d/c date is: to be determined   Subjective and Interval History:  More stool output with 2nd enema today.   Objective: Vitals:   06/04/23 0514 06/04/23 0742 06/04/23 1616 06/04/23 1934  BP: 134/73 128/70 131/60 136/67  Pulse: 98 (!) 104 91 86  Resp: 17 18 14 20   Temp: 98.8 F (37.1 C) 98.4 F (36.9 C) 97.6 F (36.4 C) 99 F (37.2 C)  TempSrc:      SpO2: 98% 100% 100% 100%  Weight:      Height:        Intake/Output Summary (Last 24 hours) at 06/04/2023 1950 Last data filed at 06/04/2023 1759 Gross per 24 hour  Intake 1981.9 ml  Output 2375 ml  Net -393.1 ml   Filed Weights   06/03/23 0305  Weight: 79.7 kg    Examination:   Constitutional: NAD, AAOx3 HEENT: conjunctivae and lids normal, EOMI CV: No cyanosis.   RESP: normal respiratory effort Abdomen: NG tube present Psych: subdued mood and affect.     Data Reviewed: I have personally reviewed labs and imaging studies  Time spent: 50 minutes  Darlin Priestly, MD [facility] If 7PM-7AM, please contact night-coverage 06/04/2023, 7:50 PM

## 2023-06-04 NOTE — Plan of Care (Signed)
  Problem: Education: Goal: Knowledge of General Education information will improve Description: Including pain rating scale, medication(s)/side effects and non-pharmacologic comfort measures Outcome: Progressing   Problem: Clinical Measurements: Goal: Ability to maintain clinical measurements within normal limits will improve Outcome: Progressing   Problem: Activity: Goal: Risk for activity intolerance will decrease Outcome: Progressing   Problem: Nutrition: Goal: Adequate nutrition will be maintained Outcome: Progressing   Problem: Coping: Goal: Level of anxiety will decrease Outcome: Progressing   Problem: Pain Managment: Goal: General experience of comfort will improve and/or be controlled Outcome: Progressing   Problem: Safety: Goal: Ability to remain free from injury will improve Outcome: Progressing   Problem: Skin Integrity: Goal: Risk for impaired skin integrity will decrease Outcome: Progressing

## 2023-06-04 NOTE — Progress Notes (Signed)
 [facility] SURGICAL PROGRESS NOTE (cpt [phone])  Hospital Day(s): 2.   Interval History: Patient seen and examined, no acute events or new complaints overnight. Patient reports he feels about the same. More frustrated with lack of sleep. No fever, chills, nausea. He continues to be without leukocytosis; WBC 5.9K. Hgb to 11.3; suspect dilutional changes. Renal function normal; sCr - 0.50; UO - 1000 ccs. No electrolyte derangements. NGT output not recorded; canister changed recently. Did have KUB this morning which continues to have massive dilation of small bowel. He is unsure of bowel function; feels that he has some flatus.   Review of Systems:  Constitutional: denies fever, chills  HEENT: denies cough or congestion  Respiratory: denies any shortness of breath  Cardiovascular: denies chest pain or palpitations  Gastrointestinal: denies abdominal pain, N/V Genitourinary: denies burning with urination or urinary frequency Musculoskeletal: denies pain, decreased motor or sensation  Vital signs in last 24 hours: [min-max] current  Temp:  [98.4 F (36.9 C)-99 F (37.2 C)] 98.4 F (36.9 C) (03/14 0742) Pulse Rate:  [98-104] 104 (03/14 0742) Resp:  [15-20] 18 (03/14 0742) BP: (126-134)/(70-73) 128/70 (03/14 0742) SpO2:  [95 %-100 %] 100 % (03/14 0742)     Height: 6' (182.9 cm) Weight: 79.7 kg BMI (Calculated): 23.82   Intake/Output last 2 shifts:  03/13 0701 - 03/14 0700 In: 497.5 [IV Piggyback:497.5] Out: 1000 [Urine:1000]   Physical Exam:  Constitutional: alert, cooperative and no distress  HENT: normocephalic without obvious abnormality; NGT in place; canister recently changed Eyes: PERRL, EOM's grossly intact and symmetric  Respiratory: breathing non-labored at rest  Cardiovascular: regular rate and sinus rhythm  Gastrointestinal: Abdomen certainly remains soft, distension improved some, he is without rebound/guarding. He is certainly not peritonitic    Labs:      Latest Ref Rng & Units 06/04/2023    3:50 AM 06/03/2023    3:02 AM 06/03/2023   12:58 AM  CBC  WBC 4.0 - 10.5 K/uL 5.9     Hemoglobin 13.0 - 17.0 g/dL 60.4  54.0  98.1   Hematocrit 39.0 - 52.0 % 34.4  40.1  37.3   Platelets 150 - 400 K/uL 220         Latest Ref Rng & Units 06/04/2023    3:50 AM 06/03/2023    3:02 AM 06/02/2023    2:24 AM  CMP  Glucose 70 - 99 mg/dL 191  478  295   BUN 8 - 23 mg/dL 20  22  27    Creatinine 0.61 - 1.24 mg/dL 6.21  3.08  6.57   Sodium 135 - 145 mmol/L 141  136  131   Potassium 3.5 - 5.1 mmol/L 3.6  3.9  4.2   Chloride 98 - 111 mmol/L 108  104  104   CO2 22 - 32 mmol/L 21  24  16    Calcium 8.9 - 10.3 mg/dL 8.1  8.1  7.7   Total Protein 6.5 - 8.1 g/dL  7.2  7.3   Total Bilirubin 0.0 - 1.2 mg/dL  0.8  1.0   Alkaline Phos 38 - 126 U/L  52  53   AST 15 - 41 U/L  40  32   ALT 0 - 44 U/L  33  38      Imaging studies:  No new imaging this AM  Assessment/Plan: (ICD-10's: K56.609) 78 y.o. male with abdominal pain, nausea, and emesis concerning for SBO, complicated by pertinent comorbidities including MS.   - Again,  his overall clinica picture is not very clear cut. His KUB still certainly remains very concerning especially given his lack of significant bowel surgery. Of course, his MS could be playing a major role here as well. Typically, we would recommend laparotomy and exploration. He is still uncertain despite discussions with palliative care last night. I suspect he may be getting confused to some degree. Fortunately, he is without evidence of peritonitis, pneumatosis, perforation to warrant need for emergent intervention. We will be happy to continue to be involved with discussion in GOC.    - For now, would continue NPO given KUB appearance and lack of reliable bowel function  - Continue NGT to LIS; monitor and record output - Monitor abdominal examination; on-going bowel function   - Pain control prn; antiemetics prn   - Further management per  primary service; we will follow    All of the above findings and recommendations were discussed with the patient, and the medical team, and all of patient's questions were answered to his expressed satisfaction.  -- Lynden Oxford, PA-C [facility] 06/04/2023, 7:47 AM M-F: 7am - 4pm

## 2023-06-04 NOTE — Consult Note (Signed)
 Palliative Care Consultation Note  78 yo man with progressive, severe MS, chronic pain, mostly bedbound at baseline for past 9 months admitted with SBO. Has ongoing issues with skin pressure injuries of lower extremities, supra pubic catheter for neurogenic bladder and struggles with neurogenic bowel and constipation. He is on chronic opioids and also daily Movantik.  Currently he has an NG tube placed for decompression, appears to be less output in suction canister over the past few hours. Outside of his chronic pain he has abdominal pain and spasm related to abdominal distention. He is NPO and has not been able to take his home meds  Imaging suggests a transition point in the bowel, surgery has seen him and suggested ex-lap, however patient has expressed a desire for surgery to be a last resort. Patient's main goal is to maintain or improve his QOL, he understands his disease is progressive and he will likely not regain his function status although PT has been seeing him at home and he has been working towards being able to transfer out of bed to a chair.  He is DNR, desires full scope medical treatment but wants to avoid any aggressive or painful interventions.   Assessment/Recommendations:  SBO, likely multifactorial from neurogenic bowel, immobility, medications, chronic opioids (on Movantik) significant constipation on CT. Patient had active and vigorous bowel sounds on exam. Tolerating NG but having some nasal congestion -will use flonase BID to reduce inflammation-consider afrin if needed. Agree with Enema and decompression of Colon, once resolved will need to determine most effective bowel regimen. Manage FEN, check mag and replete  Given patient's stated goals and clinical presentation would not recommend surgical intervention and reserve for an absolute last resort. Given rectal wall thickening could consider very short burst of decadron or even rectal budesonide to help with  bowel  function associated with MS. Chronic Pain Has been unable to get regularly scheduled opioids- will start SUBLINGUAL Oxycodone (Oxyfast) TID to avoid opioid withdrawal although patient tells me he is able to ask for pain meds-but doesn't like delay of prn. Continue IV morphine for breakthrough.  Can use IV robaxin if muscle spasms become a problem-Resume oral Gabapentin, Baclofen and other adjuvants as soon as he is taking PO Anxiety/Insomnia Patient is on at bedtime benzo scheduled and in order to prevent issues with withdrawal will give him IV ativan tonight for sleep. Other needs: Ordered Incentive spirometry- some mild upper airway congestion on exam  Will follow for addition goals of care and symptom management as needed.  Anderson Malta, DO Palliative Medicine   Time: 75 min

## 2023-06-05 ENCOUNTER — Other Ambulatory Visit: Payer: Self-pay

## 2023-06-05 ENCOUNTER — Inpatient Hospital Stay

## 2023-06-05 DIAGNOSIS — K56609 Unspecified intestinal obstruction, unspecified as to partial versus complete obstruction: Secondary | ICD-10-CM | POA: Diagnosis not present

## 2023-06-05 DIAGNOSIS — N312 Flaccid neuropathic bladder, not elsewhere classified: Secondary | ICD-10-CM

## 2023-06-05 DIAGNOSIS — Z515 Encounter for palliative care: Secondary | ICD-10-CM

## 2023-06-05 DIAGNOSIS — G35 Multiple sclerosis: Secondary | ICD-10-CM | POA: Diagnosis not present

## 2023-06-05 LAB — COMPREHENSIVE METABOLIC PANEL
ALT: 32 U/L (ref 0–44)
AST: 31 U/L (ref 15–41)
Albumin: 2.8 g/dL — ABNORMAL LOW (ref 3.5–5.0)
Alkaline Phosphatase: 39 U/L (ref 38–126)
Anion gap: 4 — ABNORMAL LOW (ref 5–15)
BUN: 16 mg/dL (ref 8–23)
CO2: 27 mmol/L (ref 22–32)
Calcium: 7.9 mg/dL — ABNORMAL LOW (ref 8.9–10.3)
Chloride: 107 mmol/L (ref 98–111)
Creatinine, Ser: 0.54 mg/dL — ABNORMAL LOW (ref 0.61–1.24)
GFR, Estimated: >60 mL/min (ref >60)
Glucose, Bld: 132 mg/dL — ABNORMAL HIGH (ref 70–99)
Potassium: 3 mmol/L — ABNORMAL LOW (ref 3.5–5.1)
Sodium: 138 mmol/L (ref 135–145)
Total Bilirubin: 0.9 mg/dL (ref 0.0–1.2)
Total Protein: 6.6 g/dL (ref 6.5–8.1)

## 2023-06-05 LAB — CBC
HCT: 33.1 % — ABNORMAL LOW (ref 39.0–52.0)
Hemoglobin: 11 g/dL — ABNORMAL LOW (ref 13.0–17.0)
MCH: 31.2 pg (ref 26.0–34.0)
MCHC: 33.2 g/dL (ref 30.0–36.0)
MCV: 93.8 fL (ref 80.0–100.0)
Platelets: 201 10*3/uL (ref 150–400)
RBC: 3.53 MIL/uL — ABNORMAL LOW (ref 4.22–5.81)
RDW: 14.3 % (ref 11.5–15.5)
WBC: 7.5 10*3/uL (ref 4.0–10.5)
nRBC: 0 % (ref 0.0–0.2)

## 2023-06-05 LAB — PHOSPHORUS: Phosphorus: 1.4 mg/dL — ABNORMAL LOW (ref 2.5–4.6)

## 2023-06-05 LAB — MAGNESIUM: Magnesium: 2.1 mg/dL (ref 1.7–2.4)

## 2023-06-05 MED ORDER — DEXAMETHASONE SODIUM PHOSPHATE 10 MG/ML IJ SOLN
8.0000 mg | Freq: Every day | INTRAMUSCULAR | Status: AC
  Administered 2023-06-05 – 2023-06-07 (×3): 8 mg via INTRAVENOUS
  Filled 2023-06-05 (×3): qty 1

## 2023-06-05 MED ORDER — CHLORHEXIDINE GLUCONATE CLOTH 2 % EX PADS
6.0000 | MEDICATED_PAD | Freq: Every day | CUTANEOUS | Status: DC
  Administered 2023-06-05 – 2023-06-11 (×7): 6 via TOPICAL

## 2023-06-05 MED ORDER — KCL IN DEXTROSE-NACL 40-5-0.9 MEQ/L-%-% IV SOLN
INTRAVENOUS | Status: AC
  Filled 2023-06-05 (×2): qty 1000

## 2023-06-05 MED ORDER — SODIUM CHLORIDE 0.9% FLUSH
10.0000 mL | Freq: Two times a day (BID) | INTRAVENOUS | Status: DC
  Administered 2023-06-05 – 2023-06-11 (×13): 10 mL

## 2023-06-05 MED ORDER — SODIUM CHLORIDE 0.9% FLUSH: 10.0000 mL | INTRAVENOUS | Status: DC | PRN

## 2023-06-05 MED ORDER — POTASSIUM PHOSPHATES 15 MMOLE/5ML IV SOLN
30.0000 mmol | Freq: Once | INTRAVENOUS | Status: AC
  Administered 2023-06-05: 30 mmol via INTRAVENOUS
  Filled 2023-06-05: qty 10

## 2023-06-05 NOTE — Progress Notes (Signed)
 Pt pulled his NG tube out. MD notified about that

## 2023-06-05 NOTE — Plan of Care (Signed)
  Problem: Education: Goal: Knowledge of General Education information will improve Description: Including pain rating scale, medication(s)/side effects and non-pharmacologic comfort measures Outcome: Progressing   Problem: Clinical Measurements: Goal: Ability to maintain clinical measurements within normal limits will improve Outcome: Progressing   Problem: Activity: Goal: Risk for activity intolerance will decrease Outcome: Progressing   Problem: Nutrition: Goal: Adequate nutrition will be maintained Outcome: Progressing   Problem: Coping: Goal: Level of anxiety will decrease Outcome: Progressing   Problem: Elimination: Goal: Will not experience complications related to bowel motility Outcome: Progressing   Problem: Pain Managment: Goal: General experience of comfort will improve and/or be controlled Outcome: Progressing   Problem: Safety: Goal: Ability to remain free from injury will improve Outcome: Progressing   Problem: Skin Integrity: Goal: Risk for impaired skin integrity will decrease Outcome: Progressing   Problem: Fluid Volume: Goal: Will show no signs and symptoms of excessive bleeding Outcome: Progressing

## 2023-06-05 NOTE — Progress Notes (Signed)
 Daily Progress Note   Patient Name: Ronald Santiago      Date: 06/05/2023 DOB: 1946-01-18  Age: 78 y.o. MRN#: [id number] Attending Physician: Darlin Priestly, MD Primary Care Physician: Mick Sell, MD Admit Date: 06/02/2023  Reason for Consultation/Follow-up: Establishing goals of care  HPI/Brief Hospital Review: 78 yo man with progressive, severe MS, chronic pain, mostly bedbound at baseline for past 9 months admitted with SBO. Has ongoing issues with skin pressure injuries of lower extremities, supra pubic catheter for neurogenic bladder and struggles with neurogenic bowel and constipation. He is on chronic opioids and also daily Movantik.   Imaging suggests a transition point in the bowel, surgery has seen him and suggested ex-lap, however patient has expressed a desire for surgery to be a last resort. Patient's main goal is to maintain or improve his QOL, he understands his disease is progressive and he will likely not regain his function status although PT has been seeing him at home and he has been working towards being able to transfer out of bed to a chair.   NGT placed for decompression-removed by Mr. Kreft 3/15-experience acute confusion and delirium, cleared throughout day  Subjective: Extensive chart review has been completed prior to meeting patient including labs, vital signs, imaging, progress notes, orders, and available advanced directive documents from current and previous encounters.    Visited with [name], he can recall and shares having a vivid dream overnight and waking up confused. Recalls removing NG tube and apologizes. He reports feeling pretty well today, no specific complaints.  Abdomen soft and non distended with positive bowel sounds. He reports no bowel  function.  Collaborated with Dr. Phillips Odor, initiate dexamethasone 8 mg IV daily x 3 days.  Called and spoke with wife who was present when general surgery rounded, in agreement with current plan of care. She expresses concern related to multiple distractions through the night. Spoke with nursing staff to pass along during shift change to attempt clustering care to avoid unnecessary distractions.  Answered and addressed all questions and concerns. PMT to continue to follow for ongoing needs and support.  Thank you for allowing the Palliative Medicine Team to assist in the care of this patient.  Total time:  35 minutes  Time spent includes: Detailed review of medical records (labs, imaging, vital signs), medically appropriate  exam (mental status, respiratory, cardiac, skin), discussed with treatment team, counseling and educating patient, family and staff, documenting clinical information, medication management and coordination of care.  Leeanne Deed, DNP, AGNP-C Palliative Medicine   Please contact Palliative Medicine Team phone at [phone] for questions and concerns.

## 2023-06-05 NOTE — Progress Notes (Addendum)
 Peripherally Inserted Central Catheter Placement  The IV Nurse has discussed with the patient and/or persons authorized to consent for the patient, the purpose of this procedure and the potential benefits and risks involved with this procedure.  The benefits include less needle sticks, lab draws from the catheter, and the patient may be discharged home with the catheter. Risks include, but not limited to, infection, bleeding, blood clot (thrombus formation), and puncture of an artery; nerve damage and irregular heartbeat and possibility to perform a PICC exchange if needed/ordered by physician.  Alternatives to this procedure were also discussed.  Bard Power PICC patient education guide, fact sheet on infection prevention and patient information card has been provided to patient /or left at bedside.  Telephone consent obtained from wife.  PICC Placement Documentation  PICC Double Lumen 06/05/23 Right Brachial 40 cm 0 cm (Active)  Indication for Insertion or Continuance of Line Administration of hyperosmolar/irritating solutions (i.e. TPN, Vancomycin, etc.) 06/05/23 1345  Exposed Catheter (cm) 0 cm 06/05/23 1345  Site Assessment Clean, Dry, Intact 06/05/23 1345  Lumen #1 Status Flushed;Saline locked;Blood return noted 06/05/23 1345  Lumen #2 Status Flushed;Saline locked;Blood return noted 06/05/23 1345  Dressing Type Transparent;Securing device 06/05/23 1345  Dressing Status Antimicrobial disc/dressing in place;Clean, Dry, Intact 06/05/23 1345  Line Care Connections checked and tightened 06/05/23 1345  Line Adjustment (NICU/IV Team Only) No 06/05/23 1345  Dressing Intervention New dressing;Adhesive placed at insertion site (IV team only);Adhesive placed around edges of dressing (IV team/ICU RN only) 06/05/23 1345  Dressing Change Due 06/12/23 06/05/23 1345       Elliot Dally 06/05/2023, 1:45 PM

## 2023-06-05 NOTE — Progress Notes (Addendum)
 PROGRESS NOTE    Ronald Santiago  OZH:[id number] DOB: 07/14/1945 DOA: 06/02/2023 PCP: Mick Sell, MD  107A/[id number]-AA  LOS: 3 days   Brief hospital course:   Assessment & Plan: Ronald Santiago is a 78 y.o. 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 Pt reports progression of moderate to severe generalized abdominal pain  and vomiting since yesterday. No fevers or chills.  Baseline progressive MS-fully bedbound.  No reports of falls or recent trauma.  Does take high-dose ibuprofen daily-roughly 8 tablets.    * SBO (small bowel obstruction) (HCC) Noted acute onset of abdominal pain with SBO on imaging  S/p NGT placement in ER- adjusted by general surgery.  NG tube pulled out by pt today. --cont MIVF --insert PICC line to prepare for TPN  AMS  --possible delirium.  Pt having hallucinations and fantasy thoughts.  GI bleeding  Fairly large amount of maroon-coffee ground output concerning for upper GI bleeding Noted regular high dose NSAID use (8 pills of IBF daily) vs.traumatic in setting of NGT placement  Hgb 17.7  Transfuse for hgb < 7  --cont IV PPI for now  Flaccid neuropathic bladder, not elsewhere classified UTI Baseline SP cath in place s/p replacement within the past 24 hours  Noted concern for UTI.  WBC 17  UA indicative of infection, started on abx, however, urine cx was not ordered on admission. --received aztreonam x1 day f/b ceftriaxone x4 days.  Proctitis + proctitis on imaging --d/c'ed flagyl  Chronic constipation --enema x2  Chronic Hyponatremia --Na normalized --monitor   Severe Sepsis (HCC) probably 2/2 Suprapubic cath Meeting sepsis criteria based on HR 100s, WBC 17, lactic acid elevation Noted concern for UTI with baseline  neurogenic bladder and SP cath in place- s/p replacement within the past 24 hours  Panculture IV rocephin  Trend lactate  LR IVF  Monitor    MS (multiple sclerosis) (HCC) Baseline History of primary progressive MS, currently bedbound. --cont home oxycodone as SL --fentanyl PRN  Stage IV decubitus ulcer, ruled out --ulcer had already healed   DVT prophylaxis: Lovenox SQ Code Status: DNR  Family Communication:  Level of care: Telemetry Medical Dispo:   The patient is from: home Anticipated d/c is to: home Anticipated d/c date is: to be determined   Subjective and Interval History:  Pt pulled out his NG tube today, also started talking about having gone out for breakfast with family this morning and having been cured of MS by God.   Objective: Vitals:   06/04/23 1934 06/05/23 0350 06/05/23 0800 06/05/23 1650  BP: 136/67 134/73 124/72 (!) 143/71  Pulse: 86 (!) 102 94 75  Resp: 20 18 20 16   Temp: 99 F (37.2 C) 99.5 F (37.5 C)  100.3 F (37.9 C)  TempSrc:      SpO2: 100% 97% 96% 98%  Weight:      Height:        Intake/Output Summary (Last 24 hours) at 06/05/2023 1736 Last data filed at 06/04/2023 1759 Gross per 24 hour  Intake --  Output 425 ml  Net -425 ml   Filed Weights   06/03/23 0305  Weight: 79.7 kg    Examination:   Constitutional: NAD, alert HEENT: conjunctivae and lids normal, EOMI CV: No cyanosis.   RESP: normal respiratory effort   Data Reviewed: I  have personally reviewed labs and imaging studies  Time spent: 35 minutes  Darlin Priestly, MD [facility] If 7PM-7AM, please contact night-coverage 06/05/2023, 5:36 PM

## 2023-06-05 NOTE — Progress Notes (Addendum)
 Patient does not have a stage 4 ulcer on the sacrum. Only scar from previous ulcer. Skin is intact, this was verify by self and Robert,RN Foam in place for protection.Vernona Rieger RN, day shift confirm that the patient does not have a sacral ulcer.

## 2023-06-05 NOTE — Progress Notes (Signed)
 45+ minutes attempting to convince pt to get a PICC line.  Called wife for consent due to pt confusion.  Can answer A&O x4 but conversation confused.  States he went to a restaurant this am for breakfast and came back.  States he is ready to go home.  Left room to notify Amandeep RN of conversation, upon return, the pt had removed his NG tube.  States "I did it for you" in a very proud tone, stated " I removed the stomach tube and I will re move the PICC line too".  Called his wife so he could have conversation with her.  He requested staff to leave the room.  This RN stood in room but out of sight, listening to conversation.  He is adamant that he and his wife went to supper last night all dressed up in ball gowns and went to breakfast this morning.  States God has healed her from cancer and he from MS and he will be walking out of the hospital soon.  Pt pleasant but unable to reorient and convince to place the PICC line. Secure chat sent to Dr Fran Lowes and RN re conversation.  Please reorder PICC line if pt is agreeable to placement.

## 2023-06-05 NOTE — Progress Notes (Addendum)
 Palliative Care Follow-up Meeting  Followed up today with patient and wife by phone. He slept much better last PM. Pain is under better control. They have had multiple provider visits today and been presented with many things to consider in terms of treatment options. His prognosis is very difficult to determine at this point. He has opted for conservative management of SBO given risks of surgery and no specific surgical goal other than exploration. 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  I discussed the possible trajectories of his current illness including: Continue conservative measures, decompression, short term TPN for nutrition, start clamping trials soon and aggressive bowel regimen -hopefully his bowel mobility will improve.  Would start dexamethasone 8mg  IV for 3 days in AM - In palliative care patients, steroids are used to treat mSBO by decreasing gut wall edema, peritoneal inflammation, and inflammation in proximity to the obstruction while indirectly decreasing pain via relief of luminal obstruction. Since he has MS and possible bowel wall edema-thickening of rectum on CT, this may be helpful. We also discussed TPN as short term intervention only. Surgical Option per GS- likely poor outcome If no improvement after a few more days of conservative management or complications occur would need to discuss transition to comfort care and possibly transition to a hospice facility for EOL.   Recommendations: Continue current symptom management orders Add dexamethasone 8mg  daily for 3 days then stop. Will continue to check in with patient and his wife in terms of goals of care.  Anderson Malta, DO Palliative Medicine

## 2023-06-05 NOTE — Progress Notes (Signed)
 [facility] SURGICAL PROGRESS NOTE (cpt [id number])  Hospital Day(s): 3.   Interval History: Patient seen and examined, some confusion overnight, patient pulled his NG tube, and had some form of delirium and storytelling. Patient reports he feels significantly better.  No fever, chills, nausea. He continues to be without leukocytosis; mild hypokalemia 3.0. Did have KUB this morning which continues to have dilation of small bowel.  He reports he did have a response to the 2 enemas, but no bowel function otherwise.  Initially refused PICC, PICC currently in place.  Review of Systems:  Constitutional: denies fever, chills  HEENT: denies cough or congestion  Respiratory: denies any shortness of breath  Cardiovascular: denies chest pain or palpitations  Gastrointestinal: denies abdominal pain, N/V Genitourinary: denies burning with urination or urinary frequency Musculoskeletal: denies pain, decreased motor or sensation  Vital signs in last 24 hours: [min-max] current  Temp:  [97.6 F (36.4 C)-99.5 F (37.5 C)] 99.5 F (37.5 C) (03/15 0350) Pulse Rate:  [86-102] 94 (03/15 0800) Resp:  [14-20] 20 (03/15 0800) BP: (124-136)/(60-73) 124/72 (03/15 0800) SpO2:  [96 %-100 %] 96 % (03/15 0800)     Height: 6' (182.9 cm) Weight: 79.7 kg BMI (Calculated): 23.82   Intake/Output last 2 shifts:  03/14 0701 - 03/15 0700 In: 1981.9 [I.V.:1851.9; NG/GT:30; IV Piggyback:100] Out: 1375 [Urine:1075; Emesis/NG output:300]   Physical Exam:  Constitutional: alert, cooperative and no distress  HENT: normocephalic without obvious abnormality;  Eyes: PERRL, EOM's grossly intact and symmetric  Respiratory: breathing non-labored at rest  Cardiovascular: regular rate and sinus rhythm  Gastrointestinal: Abdomen remains remarkably soft, he is without rebound/guarding. He is definitely not peritonitic, without any evidence of tenderness, nor distention.    Labs:     Latest Ref Rng & Units  06/05/2023    4:34 AM 06/04/2023    3:50 AM 06/03/2023    3:02 AM  CBC  WBC 4.0 - 10.5 K/uL 7.5  5.9    Hemoglobin 13.0 - 17.0 g/dL 25.3  66.4  40.3   Hematocrit 39.0 - 52.0 % 33.1  34.4  40.1   Platelets 150 - 400 K/uL 201  220        Latest Ref Rng & Units 06/05/2023    4:34 AM 06/04/2023    3:50 AM 06/03/2023    3:02 AM  CMP  Glucose 70 - 99 mg/dL 474  259  563   BUN 8 - 23 mg/dL 16  20  22    Creatinine 0.61 - 1.24 mg/dL 8.75  6.43  3.29   Sodium 135 - 145 mmol/L 138  141  136   Potassium 3.5 - 5.1 mmol/L 3.0  3.6  3.9   Chloride 98 - 111 mmol/L 107  108  104   CO2 22 - 32 mmol/L 27  21  24    Calcium 8.9 - 10.3 mg/dL 7.9  8.1  8.1   Total Protein 6.5 - 8.1 g/dL 6.6   7.2   Total Bilirubin 0.0 - 1.2 mg/dL 0.9   0.8   Alkaline Phos 38 - 126 U/L 39   52   AST 15 - 41 U/L 31   40   ALT 0 - 44 U/L 32   33      Imaging studies:    Assessment/Plan: (ICD-10's: K56.609) 78 y.o. male with abdominal pain, nausea, and emesis concerning for SBO, complicated by pertinent comorbidities including MS.   - Again, his overall clinical picture is not very clear cut.  His KUB still certainly remains very concerning especially given his lack of significant bowel surgery. Of course, his MS could be playing a major role here as well.  Fortunately, he is without evidence of peritonitis, pneumatosis, perforation to warrant need for emergent intervention.    Appreciate palliative care's input, I will continue to monitor patient's symptoms.   - For now, would continue NPO given KUB appearance and lack of reliable bowel function  -Will leave NG tube out for now. - Monitor abdominal examination; on-going bowel function   - Pain control prn; antiemetics prn   - Further management per primary service; we will follow    All of the above findings and recommendations were discussed with the patient, and the medical team, and all of patient's questions were answered to his expressed  satisfaction.  -- Campbell Lerner, M.D., [facility]  06/05/2023 ; 2:56 PM

## 2023-06-06 DIAGNOSIS — G35 Multiple sclerosis: Secondary | ICD-10-CM | POA: Diagnosis not present

## 2023-06-06 DIAGNOSIS — K56609 Unspecified intestinal obstruction, unspecified as to partial versus complete obstruction: Secondary | ICD-10-CM | POA: Diagnosis not present

## 2023-06-06 DIAGNOSIS — Z515 Encounter for palliative care: Secondary | ICD-10-CM | POA: Diagnosis not present

## 2023-06-06 LAB — CBC
HCT: 33.9 % — ABNORMAL LOW (ref 39.0–52.0)
Hemoglobin: 11.6 g/dL — ABNORMAL LOW (ref 13.0–17.0)
MCH: 31.4 pg (ref 26.0–34.0)
MCHC: 34.2 g/dL (ref 30.0–36.0)
MCV: 91.6 fL (ref 80.0–100.0)
Platelets: 195 10*3/uL (ref 150–400)
RBC: 3.7 MIL/uL — ABNORMAL LOW (ref 4.22–5.81)
RDW: 14 % (ref 11.5–15.5)
WBC: 7.5 10*3/uL (ref 4.0–10.5)
nRBC: 0 % (ref 0.0–0.2)

## 2023-06-06 LAB — BASIC METABOLIC PANEL
Anion gap: 4 — ABNORMAL LOW (ref 5–15)
BUN: 12 mg/dL (ref 8–23)
CO2: 27 mmol/L (ref 22–32)
Calcium: 8 mg/dL — ABNORMAL LOW (ref 8.9–10.3)
Chloride: 108 mmol/L (ref 98–111)
Creatinine, Ser: 0.35 mg/dL — ABNORMAL LOW (ref 0.61–1.24)
GFR, Estimated: >60 mL/min (ref >60)
Glucose, Bld: 131 mg/dL — ABNORMAL HIGH (ref 70–99)
Potassium: 3.6 mmol/L (ref 3.5–5.1)
Sodium: 139 mmol/L (ref 135–145)

## 2023-06-06 LAB — GLUCOSE, CAPILLARY: Glucose-Capillary: 136 mg/dL — ABNORMAL HIGH (ref 70–99)

## 2023-06-06 LAB — HEMOGLOBIN A1C
Hgb A1c MFr Bld: 5.2 % (ref 4.8–5.6)
Mean Plasma Glucose: 102.54 mg/dL

## 2023-06-06 LAB — PHOSPHORUS: Phosphorus: 1.5 mg/dL — ABNORMAL LOW (ref 2.5–4.6)

## 2023-06-06 LAB — MAGNESIUM: Magnesium: 2.1 mg/dL (ref 1.7–2.4)

## 2023-06-06 MED ORDER — FAT EMUL FISH OIL/PLANT BASED 20% (SMOFLIPID)IV EMUL
250.0000 mL | INTRAVENOUS | Status: AC
  Administered 2023-06-06: 250 mL via INTRAVENOUS
  Filled 2023-06-06: qty 250

## 2023-06-06 MED ORDER — INSULIN ASPART 100 UNIT/ML IJ SOLN
0.0000 [IU] | Freq: Four times a day (QID) | INTRAMUSCULAR | Status: DC
  Administered 2023-06-06 – 2023-06-08 (×7): 1 [IU] via SUBCUTANEOUS
  Administered 2023-06-09: 2 [IU] via SUBCUTANEOUS
  Administered 2023-06-09 – 2023-06-10 (×7): 1 [IU] via SUBCUTANEOUS
  Filled 2023-06-06 (×15): qty 1

## 2023-06-06 MED ORDER — TRACE MINERALS CU-MN-SE-ZN 300-55-60-3000 MCG/ML IV SOLN
INTRAVENOUS | Status: AC
  Filled 2023-06-06: qty 1000

## 2023-06-06 MED ORDER — SMOG ENEMA
960.0000 mL | Freq: Once | RECTAL | Status: AC
  Administered 2023-06-06: 960 mL via RECTAL
  Filled 2023-06-06: qty 473

## 2023-06-06 MED ORDER — QUETIAPINE FUMARATE 25 MG PO TABS
50.0000 mg | ORAL_TABLET | Freq: Every day | ORAL | Status: DC
  Administered 2023-06-06 – 2023-06-10 (×5): 50 mg via ORAL
  Filled 2023-06-06 (×5): qty 2

## 2023-06-06 MED ORDER — BACLOFEN 10 MG PO TABS
20.0000 mg | ORAL_TABLET | Freq: Four times a day (QID) | ORAL | Status: DC
  Administered 2023-06-06 – 2023-06-11 (×19): 20 mg via ORAL
  Filled 2023-06-06 (×19): qty 2

## 2023-06-06 MED ORDER — GABAPENTIN 300 MG PO CAPS
600.0000 mg | ORAL_CAPSULE | Freq: Three times a day (TID) | ORAL | Status: DC
  Administered 2023-06-06 – 2023-06-11 (×15): 600 mg via ORAL
  Filled 2023-06-06 (×15): qty 2

## 2023-06-06 MED ORDER — POTASSIUM PHOSPHATES 15 MMOLE/5ML IV SOLN
30.0000 mmol | Freq: Once | INTRAVENOUS | Status: AC
  Administered 2023-06-06: 30 mmol via INTRAVENOUS
  Filled 2023-06-06: qty 10

## 2023-06-06 NOTE — Progress Notes (Signed)
 Ronald Santiago SURGICAL PROGRESS NOTE (cpt [phone])  Hospital Day(s): 4.   Interval History: Patient seen and examined, now reports his wife took his phone and is clearly paranoid.  He has no complaints, family not in room during exam.  No fever, chills, nausea.  PICC currently in place, on TPN.  No history of swallowing issues after discussion with wife.  Review of Systems:  Constitutional: denies fever, chills  HEENT: denies cough or congestion  Respiratory: denies any shortness of breath  Cardiovascular: denies chest pain or palpitations  Gastrointestinal: denies abdominal pain, N/V Genitourinary: denies burning with urination or urinary frequency Musculoskeletal: denies pain, decreased motor or sensation  Vital signs in last 24 hours: [min-max] current  Temp:  [98 F (36.7 C)-100.3 F (37.9 C)] 99.2 F (37.3 C) (03/16 0909) Pulse Rate:  [55-75] 55 (03/16 0909) Resp:  [16-20] 18 (03/16 0909) BP: (115-143)/(52-71) 123/52 (03/16 0909) SpO2:  [97 %-98 %] 97 % (03/16 0909)     Height: 6' (182.9 cm) Weight: 79.7 kg BMI (Calculated): 23.82   Intake/Output last 2 shifts:  03/15 0701 - 03/16 0700 In: 447.2 [I.V.:98.3; IV Piggyback:348.8] Out: 1500 [Urine:1500]   Physical Exam:  Constitutional: alert, cooperative and no distress  HENT: normocephalic without obvious abnormality;  Eyes: PERRL, EOM's grossly intact and symmetric  Respiratory: breathing non-labored at rest  Cardiovascular: regular rate and sinus rhythm  Gastrointestinal: Abdomen remains remarkably soft, he is without rebound/guarding. He is definitely not peritonitic, without any evidence of tenderness, nor distention.  Still without NG tube.    Labs:     Latest Ref Rng & Units 06/05/2023    4:34 AM 06/04/2023    3:50 AM 06/03/2023    3:02 AM  CBC  WBC 4.0 - 10.5 K/uL 7.5  5.9    Hemoglobin 13.0 - 17.0 g/dL 60.4  54.0  98.1   Hematocrit 39.0 - 52.0 % 33.1  34.4  40.1   Platelets 150 - 400 K/uL 201   220        Latest Ref Rng & Units 06/06/2023    5:00 AM 06/05/2023    4:34 AM 06/04/2023    3:50 AM  CMP  Glucose 70 - 99 mg/dL 191  478  295   BUN 8 - 23 mg/dL 12  16  20    Creatinine 0.61 - 1.24 mg/dL 6.21  3.08  6.57   Sodium 135 - 145 mmol/L 139  138  141   Potassium 3.5 - 5.1 mmol/L 3.6  3.0  3.6   Chloride 98 - 111 mmol/L 108  107  108   CO2 22 - 32 mmol/L 27  27  21    Calcium 8.9 - 10.3 mg/dL 8.0  7.9  8.1   Total Protein 6.5 - 8.1 g/dL  6.6    Total Bilirubin 0.0 - 1.2 mg/dL  0.9    Alkaline Phos 38 - 126 U/L  39    AST 15 - 41 U/L  31    ALT 0 - 44 U/L  32       Imaging studies:    Assessment/Plan: (ICD-10's: K56.609) 78 y.o. male with abdominal pain, nausea, and emesis concerning for SBO, complicated by pertinent comorbidities including MS.   - Again, his overall clinical picture is not very clear cut. His KUB still certainly remains very concerning especially given his lack of significant bowel surgery. Of course, his MS could be playing a major role here as well.  Fortunately, he is without  evidence of peritonitis, pneumatosis, perforation to warrant need for emergent intervention.    Appreciate palliative care's input, I will continue to monitor patient's symptoms.   - For now, would trial sips of clear liquids. -Will leave NG tube out for now. - Monitor abdominal examination; on-going bowel function   - Pain control prn; antiemetics prn   - Further management per primary service; we will follow    All of the above findings and recommendations were discussed with the patient, and the medical team, and all of patient's questions were answered to his expressed satisfaction.  -- Ronald Santiago, M.D., [facility]  06/06/2023 ; 3:33 PM

## 2023-06-06 NOTE — Plan of Care (Addendum)
 Patient is alert and oriented X 2, with confusion as well as hallucination.he got enema and had bowel movent. TPN and Fat emulsion is going on as order. Plan of care ongoing.   Problem: Education: Goal: Knowledge of General Education information will improve Description: Including pain rating scale, medication(s)/side effects and non-pharmacologic comfort measures Outcome: Progressing   Problem: Health Behavior/Discharge Planning: Goal: Ability to manage health-related needs will improve Outcome: Progressing   Problem: Clinical Measurements: Goal: Ability to maintain clinical measurements within normal limits will improve Outcome: Progressing Goal: Will remain free from infection Outcome: Progressing Goal: Diagnostic test results will improve Outcome: Progressing Goal: Respiratory complications will improve Outcome: Progressing Goal: Cardiovascular complication will be avoided Outcome: Progressing   Problem: Clinical Measurements: Goal: Will remain free from infection Outcome: Progressing   Problem: Clinical Measurements: Goal: Diagnostic test results will improve Outcome: Progressing   Problem: Clinical Measurements: Goal: Respiratory complications will improve Outcome: Progressing   Problem: Activity: Goal: Risk for activity intolerance will decrease Outcome: Progressing   Problem: Coping: Goal: Level of anxiety will decrease Outcome: Progressing   Problem: Pain Managment: Goal: General experience of comfort will improve and/or be controlled Outcome: Progressing   Problem: Education: Goal: Ability to identify signs and symptoms of gastrointestinal bleeding will improve Outcome: Progressing

## 2023-06-06 NOTE — Consult Note (Signed)
 PHARMACY - TOTAL PARENTERAL NUTRITION CONSULT NOTE   Indication:  bowel obstruction  Patient Measurements: Height: 6' (182.9 cm) Weight: 79.7 kg (175 lb 11.3 oz) IBW/kg (Calculated) : 77.6 TPN AdjBW (KG): 79.7 Body mass index is 23.83 kg/m. 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 Pt admitted with abdominal pain, nausea, and emesis concerning for SBO. NGT was placed on 3/12 but patient pulled tube out. Pharmay has been consulted to place orders for TPN.  Glucose / Insulin: BG 131-132 Dexamethasone 8mg  daily currently ordered Electrolytes: Hypophosphatemia, Kphos IV x 1 ordered Renal: Scr<1, stable Hepatic: AST/ALT wnl Intake / Output; MIVF: net -1L GI Imaging: 3/12 CT abdomen:  1. Diffusely dilated and fluid-filled small bowel with suspected transition point at the terminal ileum. Findings are consistent with small bowel obstruction. 2. Mild rectal wall thickening, which may indicate proctitis. 3. Large amount of distal colonic stool.   GI Surgeries / Procedures:  No emergent surgical intervention needed currently  Central access: PICC placed 3/16 TPN start date: 3/16  Nutritional Goals: Goal compounded TPN rate is 90 mL/hr (provides 129.6 g of protein and 2194 kcals per day)  Due to ongoing fluid shortage, will use Clinimix premade IV bags to supplement nutritional needs for the time being until compounded TPN products are fully replenished.  RD Assessment: Estimated Needs Total Energy Estimated Needs: 2100-2300 Total Protein Estimated Needs: 120-135 grams Total Fluid Estimated Needs: 2-2.2 L  Current Nutrition:  NPO  Plan:  Clinimix E 8/10 TPN at 42 mL/hr at 1800 + SMOF lipids 20% IV  administered over 12 hours  Electrolytes in Clinimix TPN: Na 46mEq/L, K 35mEq/L, Ca 32mEq/L, Mg 33mEq/L, and Phos 44mmol/L. Cl:Ac 1:1 - TPN Contents:  Total calories: 1169 kcal Protein: 322 kcal (80g) Dextrose:342 kcal (100g) Lipids: 504 kcal Add standard MVI and trace elements to TPN Thiamine 100mg  IV x 7 days added to bag(on day 1) Initiate Sensitive q6h SSI and adjust as needed  Monitor TPN labs on Mon/Thurs, daily until stable  Antwan Pandya A Jilleen Essner 06/06/2023,12:42 PM

## 2023-06-06 NOTE — Progress Notes (Signed)
 PROGRESS NOTE    Ronald Santiago  JYN:[id number] DOB: 21-Jul-1945 DOA: 06/02/2023 PCP: Mick Sell, MD  107A/107A-AA  LOS: 4 days   Brief hospital course:   Assessment & Plan: Ronald Santiago is a 78 y.o. 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 Pt reports progression of moderate to severe generalized abdominal pain  and vomiting since yesterday. No fevers or chills.  Baseline progressive MS-fully bedbound.  No reports of falls or recent trauma.  Does take high-dose ibuprofen daily-roughly 8 tablets.    * SBO (small bowel obstruction) (HCC) Noted acute onset of abdominal pain with SBO on imaging  S/p NGT placement in ER- adjusted by general surgery.  NG tube pulled out by pt on 3/15. --start TPN today --ok for clear liquid diet, per surgery  AMS  --possible delirium, or exacerbated by withdrawal from gabapentin and baclofen (couldn't give due to SBO).  Pt having hallucinations and delusional thoughts. --trial seroquel 50 mg nightly  GI bleeding  Fairly large amount of maroon-coffee ground output concerning for upper GI bleeding Noted regular high dose NSAID use (8 pills of IBF daily) vs.traumatic in setting of NGT placement  Hgb 17.7  Transfuse for hgb < 7  --cont IV PPI for now  Flaccid neuropathic bladder, not elsewhere classified UTI Baseline SP cath in place s/p replacement within the past 24 hours  Noted concern for UTI.  WBC 17  UA indicative of infection, started on abx, however, urine cx was not ordered on admission. --received aztreonam x1 day f/b ceftriaxone x4 days.  Proctitis + proctitis on imaging --d/c'ed flagyl  Chronic constipation --3rd enema today  Chronic Hyponatremia --Na normalized --monitor   Severe Sepsis (HCC)  probably 2/2 Suprapubic cath Meeting sepsis criteria based on HR 100s, WBC 17, lactic acid elevation   MS (multiple sclerosis) (HCC) Baseline History of primary progressive MS, currently bedbound. --cont home oxycodone as SL --fentanyl PRN  Stage IV decubitus ulcer, ruled out --ulcer had already healed  Hypokalemia Hypophos --monitor and supplement with IV   DVT prophylaxis: Lovenox SQ Code Status: DNR  Family Communication: wife and sister-in-law updated at bedside today Level of care: Telemetry Medical Dispo:   The patient is from: home Anticipated d/c is to: home Anticipated d/c date is: to be determined   Subjective and Interval History:  Family reported more delusions and hallucinations from pt today.  Pt had no complaints.   Objective: Vitals:   06/05/23 1650 06/05/23 1952 06/06/23 0535 06/06/23 0909  BP: (!) 143/71 125/62 115/60 (!) 123/52  Pulse: 75 73 70 (!) 55  Resp: 16 18 20 18   Temp: 100.3 F (37.9 C) 98 F (36.7 C) 98.5 F (36.9 C) 99.2 F (37.3 C)  TempSrc:      SpO2: 98% 98% 97% 97%  Weight:      Height:        Intake/Output Summary (Last 24 hours) at 06/06/2023 1609 Last data filed at 06/06/2023 0521 Gross per 24 hour  Intake 447.17 ml  Output 1500 ml  Net -1052.83 ml   Filed Weights   06/03/23 0305  Weight: 79.7 kg    Examination:   Constitutional: NAD, alert, confused HEENT: conjunctivae and lids normal, EOMI CV: No cyanosis.   RESP: normal respiratory effort   Data Reviewed: I have personally reviewed labs and imaging  studies  Time spent: 35 minutes  Darlin Priestly, MD [facility] If 7PM-7AM, please contact night-coverage 06/06/2023, 4:09 PM

## 2023-06-06 NOTE — Progress Notes (Signed)
 Daily Progress Note   Patient Name: Ronald Santiago      Date: 06/06/2023 DOB: 04/11/1945  Age: 78 y.o. MRN#: [id number] Attending Physician: Darlin Priestly, MD Primary Care Physician: Mick Sell, MD Admit Date: 06/02/2023  Reason for Consultation/Follow-up: Establishing goals of care  HPI/Brief Hospital Review: 78 yo man with progressive, severe MS, chronic pain, mostly bedbound at baseline for past 9 months admitted with SBO. Has ongoing issues with skin pressure injuries of lower extremities, supra pubic catheter for neurogenic bladder and struggles with neurogenic bowel and constipation. He is on chronic opioids and also daily Movantik.   Imaging suggests a transition point in the bowel, surgery has seen him and suggested ex-lap, however patient has expressed a desire for surgery to be a last resort. Patient's main goal is to maintain or improve his QOL, he understands his disease is progressive and he will likely not regain his function status although PT has been seeing him at home and he has been working towards being able to transfer out of bed to a chair.    NGT placed for decompression-removed by Ronald Santiago 3/15-experience acute confusion and delirium, cleared throughout day 3/16 NGT remains out, diet had been advanced  Palliative Medicine consulted for assisting with goals of care conversations.  Subjective: Extensive chart review has been completed prior to meeting patient including labs, vital signs, imaging, progress notes, orders, and available advanced directive documents from current and previous encounters.    Visited with Ronald Santiago at his bedside earlier in the day. Rectal tube and enema recently placed, lying on his side, complaining of back discomfort. Seems to still be having  moments of confusion/delirium. PICC line in place, TPN scheduled to start this evening.  Wife and other family at bedside during visit. Wife suggests restarting home medications such as gabapentin and baclofen as Ronald Santiago has recently started complaining of muscle spasms and cramping. They wish to initiate gabapentin at lower dose as symptoms have been controlled for several days without medications. Collaborated with primary team, orders placed to start baclofen at PTA dose and frequency, initiating gabapentin at half dosage-recommend increasing to PTA dose as needed and tolerated.  Ronald Santiago and family continue to remain hopeful for recovery and his ability to return home.  Answered and addressed all questions and concerns, PMT to  continue to follow for ongoing needs and support. If able to tolerate advanced diet may need to consider initiating majority of PTA meds.  Objective:  Physical Exam Constitutional:      General: He is not in acute distress.    Appearance: He is ill-appearing.  Abdominal:     General: There is no distension.     Tenderness: There is abdominal tenderness in the left lower quadrant.  Skin:    General: Skin is warm and dry.  Neurological:     Mental Status: He is alert.     Motor: Weakness present.             Vital Signs: BP (!) 123/52 (BP Location: Left Arm)   Pulse (!) 55   Temp 99.2 F (37.3 C)   Resp 18   Ht 6' (1.829 m)   Wt 79.7 kg   SpO2 97%   BMI 23.83 kg/m  SpO2: SpO2: 97 % O2 Device: O2 Device: Nasal Cannula O2 Flow Rate: O2 Flow Rate (L/min): 2 L/min   Palliative Care Assessment & Plan   Assessment/Recommendation/Plan  Restarted gabapentin at half PTA dose increase as needed/tolerated and Baclofen Recommend restarting PTA meds such as oral oxycodone/Klonopin if able to tolerate advanced diet  Care plan was discussed with primary team  Thank you for allowing the Palliative Medicine Team to assist in the care of this patient.  Total  time:  50 minutes  Time spent includes: Detailed review of medical records (labs, imaging, vital signs), medically appropriate exam (mental status, respiratory, cardiac, skin), discussed with treatment team, counseling and educating patient, family and staff, documenting clinical information, medication management and coordination of care.  Leeanne Deed, DNP, AGNP-C Palliative Medicine   Please contact Palliative Medicine Team phone at [phone] for questions and concerns.

## 2023-06-07 DIAGNOSIS — E43 Unspecified severe protein-calorie malnutrition: Secondary | ICD-10-CM | POA: Insufficient documentation

## 2023-06-07 DIAGNOSIS — K56609 Unspecified intestinal obstruction, unspecified as to partial versus complete obstruction: Secondary | ICD-10-CM | POA: Diagnosis not present

## 2023-06-07 DIAGNOSIS — Z7189 Other specified counseling: Secondary | ICD-10-CM | POA: Diagnosis not present

## 2023-06-07 LAB — BASIC METABOLIC PANEL
Anion gap: 7 (ref 5–15)
BUN: 12 mg/dL (ref 8–23)
CO2: 22 mmol/L (ref 22–32)
Calcium: 8.1 mg/dL — ABNORMAL LOW (ref 8.9–10.3)
Chloride: 101 mmol/L (ref 98–111)
Creatinine, Ser: 0.34 mg/dL — ABNORMAL LOW (ref 0.61–1.24)
GFR, Estimated: >60 mL/min (ref >60)
Glucose, Bld: 126 mg/dL — ABNORMAL HIGH (ref 70–99)
Potassium: 3.6 mmol/L (ref 3.5–5.1)
Sodium: 130 mmol/L — ABNORMAL LOW (ref 135–145)

## 2023-06-07 LAB — PHOSPHORUS
Phosphorus: 1.6 mg/dL — ABNORMAL LOW (ref 2.5–4.6)
Phosphorus: 3.8 mg/dL (ref 2.5–4.6)

## 2023-06-07 LAB — MAGNESIUM: Magnesium: 2 mg/dL (ref 1.7–2.4)

## 2023-06-07 LAB — CULTURE, BLOOD (ROUTINE X 2)
Culture: NO GROWTH
Culture: NO GROWTH

## 2023-06-07 LAB — CBC
HCT: 31.6 % — ABNORMAL LOW (ref 39.0–52.0)
Hemoglobin: 11.1 g/dL — ABNORMAL LOW (ref 13.0–17.0)
MCH: 32.1 pg (ref 26.0–34.0)
MCHC: 35.1 g/dL (ref 30.0–36.0)
MCV: 91.3 fL (ref 80.0–100.0)
Platelets: 212 10*3/uL (ref 150–400)
RBC: 3.46 MIL/uL — ABNORMAL LOW (ref 4.22–5.81)
RDW: 13.9 % (ref 11.5–15.5)
WBC: 7.4 10*3/uL (ref 4.0–10.5)
nRBC: 0 % (ref 0.0–0.2)

## 2023-06-07 LAB — GLUCOSE, CAPILLARY
Glucose-Capillary: 111 mg/dL — ABNORMAL HIGH (ref 70–99)
Glucose-Capillary: 143 mg/dL — ABNORMAL HIGH (ref 70–99)
Glucose-Capillary: 147 mg/dL — ABNORMAL HIGH (ref 70–99)
Glucose-Capillary: 150 mg/dL — ABNORMAL HIGH (ref 70–99)

## 2023-06-07 LAB — POTASSIUM: Potassium: 4.8 mmol/L (ref 3.5–5.1)

## 2023-06-07 LAB — SODIUM: Sodium: 133 mmol/L — ABNORMAL LOW (ref 135–145)

## 2023-06-07 MED ORDER — TRACE MINERALS CU-MN-SE-ZN 300-55-60-3000 MCG/ML IV SOLN
INTRAVENOUS | Status: AC
Start: 2023-06-07 — End: 2023-06-08
  Filled 2023-06-07: qty 1000

## 2023-06-07 MED ORDER — THIAMINE HCL 100 MG/ML IJ SOLN
100.0000 mg | Freq: Every day | INTRAMUSCULAR | Status: DC
  Administered 2023-06-07 – 2023-06-11 (×5): 100 mg via INTRAVENOUS
  Filled 2023-06-07 (×5): qty 2

## 2023-06-07 MED ORDER — FAT EMUL FISH OIL/PLANT BASED 20% (SMOFLIPID)IV EMUL
250.0000 mL | INTRAVENOUS | Status: AC
  Administered 2023-06-07: 250 mL via INTRAVENOUS
  Filled 2023-06-07: qty 250

## 2023-06-07 MED ORDER — POTASSIUM PHOSPHATES 15 MMOLE/5ML IV SOLN
30.0000 mmol | Freq: Once | INTRAVENOUS | Status: AC
  Administered 2023-06-07: 30 mmol via INTRAVENOUS
  Filled 2023-06-07: qty 10

## 2023-06-07 NOTE — Progress Notes (Signed)
 [facility] SURGICAL PROGRESS NOTE (cpt [phone])  Hospital Day(s): 5.   Interval History: Patient seen and examined, no acute events or new complaints overnight. Patient still confused this AM. States "his phone was destroyed and [place] [employer] gave him a new one." He denied any pain when asked. History otherwise not reliable this AM. He remains without leukocytosis; WBC 7.4K this morning. Hgb to 11.1 which is stable. sCr - 0.34; UO - 3650 ccs. Mild hyponatremia to 130, hypophosphatemia to 1.6. No new imaging this AM. He is on CLD; . Also on TPN.   Review of Systems:  Unable to reliably perform secondary to confusion   Vital signs in last 24 hours: [min-max] current  Temp:  [98 F (36.7 C)-99.4 F (37.4 C)] 98.1 F (36.7 C) (03/17 0754) Pulse Rate:  [60-94] 94 (03/17 0754) Resp:  [15-20] 16 (03/17 0754) BP: (109-133)/(61-88) 130/88 (03/17 0754) SpO2:  [96 %-99 %] 96 % (03/17 0754)     Height: 6' (182.9 cm) Weight: 79.7 kg BMI (Calculated): 23.82   Intake/Output last 2 shifts:  03/16 0701 - 03/17 0700 In: 600 [P.O.:600] Out: 3650 [Urine:3650]   Physical Exam:  Constitutional: somnolent, arouses but confused   HENT: normocephalic without obvious abnormality Eyes: PERRL, EOM's grossly intact and symmetric  Respiratory: breathing non-labored at rest  Cardiovascular: regular rate and sinus rhythm  Gastrointestinal: Abdomen is certainly softer this morning, distension improved, he does not appear overtly tender, no rebound/guarding. He is certainly not peritonitic.     Labs:     Latest Ref Rng & Units 06/07/2023    2:35 AM 06/06/2023    5:30 PM 06/05/2023    4:34 AM  CBC  WBC 4.0 - 10.5 K/uL 7.4  7.5  7.5   Hemoglobin 13.0 - 17.0 g/dL 60.4  54.0  98.1   Hematocrit 39.0 - 52.0 % 31.6  33.9  33.1   Platelets 150 - 400 K/uL 212  195  201       Latest Ref Rng & Units 06/07/2023    2:35 AM 06/06/2023    5:00 AM 06/05/2023    4:34 AM  CMP  Glucose 70 - 99 mg/dL 191   478  295   BUN 8 - 23 mg/dL 12  12  16    Creatinine 0.61 - 1.24 mg/dL 6.21  3.08  6.57   Sodium 135 - 145 mmol/L 130  139  138   Potassium 3.5 - 5.1 mmol/L 3.6  3.6  3.0   Chloride 98 - 111 mmol/L 101  108  107   CO2 22 - 32 mmol/L 22  27  27    Calcium 8.9 - 10.3 mg/dL 8.1  8.0  7.9   Total Protein 6.5 - 8.1 g/dL   6.6   Total Bilirubin 0.0 - 1.2 mg/dL   0.9   Alkaline Phos 38 - 126 U/L   39   AST 15 - 41 U/L   31   ALT 0 - 44 U/L   32      Imaging studies:  No new imaging studies   Assessment/Plan: (ICD-10's: K62.609) 78 y.o. male with likely pSBO, complicated by pertinent comorbidities including MS.   - Continue CLD for now; Okay for FLD later today if more alert and doing well  - No need for surgical intervention currently; will follow closely  - No need to replace NGT at this time unless emesis recurs  - Monitor abdominal examination; on-going bowel function   - Pain  control prn; antiemetics prn   - Further management per primary service; we will follow    All of the above findings and recommendations were discussed with the patient, and the medical team, and all of patient's questions were answered to his expressed satisfaction.  -- Lynden Oxford, PA-C [facility] 06/07/2023, 10:08 AM M-F: 7am - 4pm

## 2023-06-07 NOTE — Consult Note (Signed)
 PHARMACY - TOTAL PARENTERAL NUTRITION CONSULT NOTE   Indication:  bowel obstruction  Patient Measurements: Height: 6' (182.9 cm) Weight: 79.7 kg (175 lb 11.3 oz) IBW/kg (Calculated) : 77.6 TPN AdjBW (KG): 79.7 Body mass index is 23.83 kg/m. 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 Pt admitted with abdominal pain, nausea, and emesis concerning for SBO. NGT was placed on 3/12 but patient pulled tube out. Pharmay has been consulted to place orders for TPN.  Glucose / Insulin: BG 111-143 Hgb a1c 5.2 Dexamethasone 8mg  daily currently ordered sSSI/ 24 hrs:  2 units Electrolytes: Hypophosphatemia  Phos 1.6, Kphos IV x 1 was ordered 3/16,  *Will order Kphos 30 mmol IV x 1 again today 3/17 Hyponatremia Na 130 Renal: Scr<1, stable Hepatic: AST/ALT wnl Intake / Output;  MIVF: none GI Imaging: 3/12 CT abdomen:  1. Diffusely dilated and fluid-filled small bowel with suspected transition point at the terminal ileum. Findings are consistent with small bowel obstruction. 2. Mild rectal wall thickening, which may indicate proctitis. 3. Large amount of distal colonic stool.   GI Surgeries / Procedures:  No emergent surgical intervention needed currently  Central access: PICC placed 3/16 TPN start date: 3/16  Nutritional Goals: Goal compounded TPN rate is 90 mL/hr (provides 129.6 g of protein and 2194 kcals per day)  Due to ongoing fluid shortage, will use Clinimix premade IV bags to supplement nutritional needs for the time being until compounded TPN products are fully replenished.  RD Assessment: Estimated Needs Total Energy Estimated Needs: 2100-2300 Total Protein Estimated Needs: 120-135 grams Total Fluid  Estimated Needs: 2-2.2 L  Current Nutrition:  CLD started 3/16  Plan:  Continue Clinimix E 8/10 TPN at lower rate of 42 mL/hr at 1800 + SMOF lipids 20% IV administered over 12 hours as pt refeeding, phosphate being replaced.   -Likely change to compounded TPN when Phos replaced, to be able to meet kcal goal. -Phos recheck at 1800 -Electrolytes in Clinimix TPN: Na 93mEq/L, K 43mEq/L, Ca 53mEq/L, Mg 66mEq/L, and Phos 59mmol/L. Cl:Ac 1:1 - TPN Contents:  Total calories: 1169 kcal Protein: 322 kcal (80g) Dextrose:342 kcal (100g) Lipids: 504 kcal -Add standard MVI and trace elements to TPN -Thiamine 100mg  IV x 7 days -outside TPN(started 3/16) -Initiate Sensitive q6h SSI and adjust as needed  Monitor TPN labs on Mon/Thurs, daily until stable  Bari Mantis PharmD Clinical Pharmacist 06/07/2023

## 2023-06-07 NOTE — Progress Notes (Addendum)
 Daily Progress Note   Patient Name: Ronald Santiago       Date: 06/07/2023 DOB: [birth year]  Age: 78 y.o. MRN#: [id number] Attending Physician: Darlin Priestly, MD Primary Care Physician: Mick Sell, MD Admit Date: 06/02/2023  Reason for Consultation/Follow-up: Establishing goals of care  Subjective: Notes and labs reviewed.  In to see patient.  He is currently resting in bed, no family at bedside.  He has a breakfast tray in front of him.  He states his appetite is returning.  He is hopeful to be able to tolerate an oral diet and discontinue TPN soon.   In regards to his pain and muscle spasms, he states he is feeling much better.  Continue current symptom management regimen.  Continue current care; advancing diet as tolerated.  Length of Stay: 5  Current Medications: Scheduled Meds:   baclofen  20 mg Oral QID   Chlorhexidine Gluconate Cloth  6 each Topical Daily   diazepam  5 mg Intravenous QHS   enoxaparin (LOVENOX) injection  40 mg Subcutaneous Q24H   fluticasone  2 spray Each Nare Daily   gabapentin  600 mg Oral TID   insulin aspart  0-9 Units Subcutaneous Q6H   oxyCODONE  5 mg Sublingual TID   pantoprazole (PROTONIX) IV  40 mg Intravenous Q12H   QUEtiapine  50 mg Oral QHS   sodium chloride flush  10-40 mL Intracatheter Q12H   thiamine (VITAMIN B1) injection  100 mg Intravenous Daily    Continuous Infusions:  .TPN (CLINIMIX-E) Adult     And   fat emul(SMOFlipid)     potassium PHOSPHATE IVPB (in mmol) 30 mmol (06/07/23 1050)   TPN (CLINIMIX-E) Adult 42 mL/hr at 06/06/23 1752    PRN Meds: fentaNYL (SUBLIMAZE) injection, ondansetron (ZOFRAN) IV, sodium chloride flush  Physical Exam Pulmonary:     Effort: Pulmonary effort is normal.  Skin:    General: Skin is warm and dry.   Neurological:     Mental Status: He is alert.             Vital Signs: BP 130/88 (BP Location: Left Arm)   Pulse 94   Temp 98.1 F (36.7 C)   Resp 16   Ht 6' (1.829 m)   Wt 79.7 kg   SpO2 96%   BMI 23.83 kg/m  SpO2: SpO2: 96 % O2 Device: O2 Device: Room Air O2 Flow Rate: O2 Flow Rate (L/min): 2 L/min  Intake/output summary:  Intake/Output Summary (Last 24 hours) at 06/07/2023 1133 Last data filed at 06/07/2023 0908 Gross per 24 hour  Intake 600 ml  Output 4500 ml  Net -3900 ml   LBM: Last BM Date : 06/06/23 Baseline Weight: Weight: 79.7 kg Most recent weight: Weight: 79.7 kg    Patient Active Problem List   Diagnosis Date Noted   Small bowel obstruction (HCC) 06/03/2023   Proctitis 06/02/2023   Cough 12/05/2022   Multiple sclerosis (HCC) 12/03/2022   Empyema lung (HCC) 12/03/2022   Community acquired pneumonia of right lung 12/02/2022   Sepsis (HCC) 12/02/2022   Empyema (HCC) 12/02/2022   Lung mass 11/30/2022   Constipation 11/30/2022   Hyponatremia 11/30/2022   Need for home health care 04/20/2021   Acute lower UTI 04/16/2021   Hypophosphatemia 04/16/2021   AKI (acute kidney injury) (HCC) 04/16/2021   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   In Pre-Arrest Conditions (Patient Is Breathing and Has A Pulse) Do not intubate. Provide all appropriate non-invasive medical interventions. Avoid ICU transfer  unless indicated or required.   Consent: Discussion documented in EHR or advanced directives reviewed      06/02/23 0931           Code Status History     Date Active Date Inactive Code Status Order ID Comments User Context   12/02/2022 1317 12/14/2022 1648 Limited: Do not attempt resuscitation (DNR) -DNR-LIMITED -Do Not Intubate/DNI  [id number]  Erin Fulling, MD Inpatient   11/30/2022 2223 12/02/2022 1317 Full Code [id number]  Verdene Lennert, MD ED   04/10/2021 1531 04/22/2021 2322 Full Code [id number]  Mansy, Vernetta Honey, MD ED   04/10/2021 1523 04/10/2021 1531 Full Code [id number]  Mansy, Vernetta Honey, MD ED      Advance Directive Documentation    Flowsheet Row Most Recent Value  Type of Advance Directive Out of facility DNR (pink MOST or yellow form)  Pre-existing out of facility DNR order (yellow form or pink MOST form) Yellow form placed in chart (order not valid for inpatient use)  "MOST" Form in Place? --      Thank you for allowing the Palliative Medicine Team to assist in the care of this patient.    Morton Stall, NP  Please contact Palliative Medicine Team phone at [phone] for questions and concerns.

## 2023-06-07 NOTE — Progress Notes (Signed)
 PROGRESS NOTE    Ronald Santiago  ZOX:[id number] DOB: 1946/01/20 DOA: 06/02/2023 PCP: Mick Sell, MD  107A/107A-AA  LOS: 5 days   Brief hospital course:   Assessment & Plan: Ronald Santiago is a 78 y.o. 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 Pt reports progression of moderate to severe generalized abdominal pain  and vomiting since yesterday. No fevers or chills.  Baseline progressive MS-fully bedbound.  No reports of falls or recent trauma.  Does take high-dose ibuprofen daily-roughly 8 tablets.    * SBO (small bowel obstruction) (HCC) Noted acute onset of abdominal pain with SBO on imaging  S/p NGT placement in ER- adjusted by general surgery.  NG tube pulled out by pt on 3/15. --TPN started on 3/16 --clear liquid diet  AMS  --possible delirium, or exacerbated by withdrawal from gabapentin and baclofen (couldn't give due to SBO).  Pt having hallucinations and delusional thoughts. --cont seroquel 50 mg nightly (new)  GI bleeding  Fairly large amount of maroon-coffee ground output concerning for upper GI bleeding Noted regular high dose NSAID use (8 pills of IBF daily) vs.traumatic in setting of NGT placement  Hgb 17.7  Transfuse for hgb < 7  --cont IV PPI for now  Flaccid neuropathic bladder, not elsewhere classified UTI Baseline SP cath in place s/p replacement within the past 24 hours  Noted concern for UTI.  WBC 17  UA indicative of infection, started on abx, however, urine cx was not ordered on admission. --received aztreonam x1 day f/b ceftriaxone x4 days.  Proctitis + proctitis on imaging --d/c'ed flagyl  Chronic constipation --s/p 3 enemas  Chronic Hyponatremia --Na normalized --monitor   Severe Sepsis (HCC) probably  2/2 Suprapubic cath Meeting sepsis criteria based on HR 100s, WBC 17, lactic acid elevation   MS (multiple sclerosis) (HCC) Baseline History of primary progressive MS, currently bedbound. --cont home oxycodone as SL --fentanyl PRN  Stage IV decubitus ulcer, ruled out --ulcer had already healed  Hypokalemia Hypophos --monitor and supplement with IV   DVT prophylaxis: Lovenox SQ Code Status: DNR  Family Communication: wife and sister-in-law updated at bedside today Level of care: Telemetry Medical Dispo:   The patient is from: home Anticipated d/c is to: home Anticipated d/c date is: to be determined   Subjective and Interval History:  Still confused and delusional.   Objective: Vitals:   06/07/23 0555 06/07/23 0754 06/07/23 1638 06/07/23 1911  BP: 109/61 130/88 132/76 122/77  Pulse: 75 94 78 92  Resp: 20 16 16 16   Temp: 98.7 F (37.1 C) 98.1 F (36.7 C) 98.3 F (36.8 C) 98.3 F (36.8 C)  TempSrc:      SpO2: 98% 96% 99% 98%  Weight:      Height:        Intake/Output Summary (Last 24 hours) at 06/07/2023 1935 Last data filed at 06/07/2023 1500 Gross per 24 hour  Intake 360 ml  Output 2850 ml  Net -2490 ml   Filed Weights   06/03/23 0305  Weight: 79.7 kg    Examination:   Constitutional: NAD, alert, oriented but confused HEENT: conjunctivae and lids normal, EOMI CV: No cyanosis.   RESP: normal respiratory effort Extremities: edema in BLE   Data Reviewed: I have personally reviewed labs and imaging studies  Time spent: 35 minutes  Darlin Priestly,  MD [facility] If 7PM-7AM, please contact night-coverage 06/07/2023, 7:35 PM

## 2023-06-07 NOTE — Care Management Important Message (Signed)
 Important Message  Patient Details  Name: Ronald Santiago MRN: [id number] Date of Birth: [birth year]   Important Message Given:  Yes - Medicare IM     Cristela Blue, CMA 06/07/2023, 11:56 AM

## 2023-06-07 NOTE — Progress Notes (Signed)
 Nutrition Follow-up  DOCUMENTATION CODES:   Severe malnutrition in context of chronic illness  INTERVENTION:   -TPN management per pharmacy -Recommend 100 mg thiamine daily x 7 days -Recommend MVI daily -Monitor Mg, K, and Phos and replete as needed secondary to high refeeding risk   NUTRITION DIAGNOSIS:   Severe Malnutrition related to chronic illness (MS) as evidenced by moderate fat depletion, severe fat depletion, moderate muscle depletion, severe muscle depletion.  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  3/15- NGT d/c (pulled out by pt), PICC placed 3/16- advanced to clear liquid diet, TPN initiated  Reviewed I/O's: -3.1 L x 24 hours and -4.1 L since admission  UOP: 3.7 L x 24 hours  Case discussed with RN, who reports pt remains with confusion, believes someone stole his laptop. He is taking sips of clear liquid diet without difficulty.   Spoke with pt at bedside, who was pleasantly confused at time of visit. Pt reports feeling better and RD assisted pt with covering him with blankets as he complained he was cold. Pt shares he did not sleep well last night, as "the TV crew from the [place] [employer] show was in the hallway all night". No family present to provide additional history.   Per general surgery notes, may advance to full liquid diet later today if doing well. No plan to replace NGT unless emesis occurs.   Pt remains on TPN- currently infusing at 42 ml/hr with SMOFLIPID 20%. Regimen provides 1169 kcals and 81 grams protein, which provides 56% of  estimated kcal needs and 68% of estimated protein needs. Per pharmacy, plan to continue rate today due to refeeding. Phos is being replaced.    Palliative care following for goals of care discussions; possible plan to transition to comfort care if pt does not improve.   Medications reviewed and include valcium, lovenox, neurontin, and thiamine.   Labs reviewed: K and Mg WDL, Phos: 1.6 (being repleted), Na: 130, CBGS: 111-143 (inpatient orders for glycemic control are 0-9 units insulin aspart every 6 hours).    NUTRITION - FOCUSED PHYSICAL EXAM:  Flowsheet Row Most Recent Value  Orbital Region Severe depletion  Upper Arm Region Moderate depletion  Thoracic and Lumbar Region Severe depletion  Buccal Region Moderate depletion  Temple Region Severe depletion  Clavicle Bone Region Moderate depletion  Clavicle and Acromion Bone Region Moderate depletion  Scapular Bone Region Moderate depletion  Dorsal Hand Severe depletion  Patellar Region Moderate depletion  Anterior Thigh Region Moderate depletion  Posterior Calf Region Moderate depletion  Edema (RD Assessment) Mild  Hair Reviewed  Eyes Reviewed  Mouth Reviewed  Skin Reviewed  Nails Reviewed       Diet Order:   Diet Order             Diet clear liquid Room service appropriate? Yes; Fluid consistency: Thin  Diet effective now                   EDUCATION NEEDS:   Not appropriate for education at this time  Skin:  Skin Assessment: Skin Integrity Issues: Skin Integrity  Issues:: Other (Comment), Stage IV Stage IV: sacrum Other: skin tear to lt pretibial  Last BM:  06/06/23 (type 7)  Height:   Ht Readings from Last 1 Encounters:  06/03/23 6' (1.829 m)    Weight:   Wt Readings from Last 1 Encounters:  06/03/23 79.7 kg    Ideal Body Weight:  80.9 kg  BMI:  Body mass index is 23.83 kg/m.  Estimated Nutritional Needs:   Kcal:  2100-2300  Protein:  120-135 grams  Fluid:  2-2.2 L    Levada Schilling, RD,  LDN, CDCES Registered Dietitian III Certified Diabetes Care and Education Specialist If unable to reach this RD, please use "RD Inpatient" group chat on secure chat between hours of 8am-4 pm daily

## 2023-06-08 ENCOUNTER — Other Ambulatory Visit: Payer: Self-pay

## 2023-06-08 DIAGNOSIS — Z7189 Other specified counseling: Secondary | ICD-10-CM | POA: Diagnosis not present

## 2023-06-08 DIAGNOSIS — K56609 Unspecified intestinal obstruction, unspecified as to partial versus complete obstruction: Secondary | ICD-10-CM | POA: Diagnosis not present

## 2023-06-08 LAB — CBC
HCT: 34.1 % — ABNORMAL LOW (ref 39.0–52.0)
Hemoglobin: 12 g/dL — ABNORMAL LOW (ref 13.0–17.0)
MCH: 32 pg (ref 26.0–34.0)
MCHC: 35.2 g/dL (ref 30.0–36.0)
MCV: 90.9 fL (ref 80.0–100.0)
Platelets: 197 10*3/uL (ref 150–400)
RBC: 3.75 MIL/uL — ABNORMAL LOW (ref 4.22–5.81)
RDW: 13.7 % (ref 11.5–15.5)
WBC: 5.8 10*3/uL (ref 4.0–10.5)
nRBC: 0 % (ref 0.0–0.2)

## 2023-06-08 LAB — PHOSPHORUS: Phosphorus: 2.5 mg/dL (ref 2.5–4.6)

## 2023-06-08 LAB — GLUCOSE, CAPILLARY
Glucose-Capillary: 117 mg/dL — ABNORMAL HIGH (ref 70–99)
Glucose-Capillary: 136 mg/dL — ABNORMAL HIGH (ref 70–99)
Glucose-Capillary: 137 mg/dL — ABNORMAL HIGH (ref 70–99)
Glucose-Capillary: 139 mg/dL — ABNORMAL HIGH (ref 70–99)
Glucose-Capillary: 144 mg/dL — ABNORMAL HIGH (ref 70–99)

## 2023-06-08 LAB — BASIC METABOLIC PANEL
Anion gap: 6 (ref 5–15)
BUN: 15 mg/dL (ref 8–23)
CO2: 22 mmol/L (ref 22–32)
Calcium: 8.6 mg/dL — ABNORMAL LOW (ref 8.9–10.3)
Chloride: 104 mmol/L (ref 98–111)
Creatinine, Ser: 0.34 mg/dL — ABNORMAL LOW (ref 0.61–1.24)
GFR, Estimated: >60 mL/min (ref >60)
Glucose, Bld: 142 mg/dL — ABNORMAL HIGH (ref 70–99)
Potassium: 4 mmol/L (ref 3.5–5.1)
Sodium: 132 mmol/L — ABNORMAL LOW (ref 135–145)

## 2023-06-08 LAB — MAGNESIUM: Magnesium: 2.2 mg/dL (ref 1.7–2.4)

## 2023-06-08 MED ORDER — TRAVASOL 10 % IV SOLN
INTRAVENOUS | Status: AC
  Filled 2023-06-08: qty 1296

## 2023-06-08 MED ORDER — ENSURE ENLIVE PO LIQD
237.0000 mL | Freq: Two times a day (BID) | ORAL | Status: DC
  Administered 2023-06-08 – 2023-06-11 (×6): 237 mL via ORAL

## 2023-06-08 MED ORDER — OXYCODONE HCL 5 MG PO TABS
5.0000 mg | ORAL_TABLET | ORAL | Status: DC | PRN
  Administered 2023-06-09 – 2023-06-10 (×2): 5 mg via ORAL
  Filled 2023-06-08 (×2): qty 1

## 2023-06-08 MED ORDER — CLONAZEPAM 0.5 MG PO TABS: 1.0000 mg | ORAL_TABLET | Freq: Every evening | ORAL | Status: DC | PRN

## 2023-06-08 NOTE — Progress Notes (Addendum)
 Daily Progress Note   Patient Name: Ronald Santiago       Date: 06/08/2023 DOB: 11-Sep-1945  Age: 78 y.o. MRN#: [id number] Attending Physician: Darlin Priestly, MD Primary Care Physician: Mick Sell, MD Admit Date: 06/02/2023  Reason for Consultation/Follow-up: Establishing goals of care  Subjective: Into see patient.  He states his appetite is improving, and he is hungry but needs assistance with eating.  He states he is passing gas and has no nausea.  He denies pain or other complaint.  He continues to remain hopeful to be able to go home.  Length of Stay: 6  Current Medications: Scheduled Meds:   baclofen  20 mg Oral QID   Chlorhexidine Gluconate Cloth  6 each Topical Daily   diazepam  5 mg Intravenous QHS   enoxaparin (LOVENOX) injection  40 mg Subcutaneous Q24H   feeding supplement  237 mL Oral BID BM   fluticasone  2 spray Each Nare Daily   gabapentin  600 mg Oral TID   insulin aspart  0-9 Units Subcutaneous Q6H   oxyCODONE  5 mg Sublingual TID   pantoprazole (PROTONIX) IV  40 mg Intravenous Q12H   QUEtiapine  50 mg Oral QHS   sodium chloride flush  10-40 mL Intracatheter Q12H   thiamine (VITAMIN B1) injection  100 mg Intravenous Daily    Continuous Infusions:  .TPN (CLINIMIX-E) Adult 42 mL/hr at 06/07/23 1835   TPN ADULT (ION)      PRN Meds: fentaNYL (SUBLIMAZE) injection, ondansetron (ZOFRAN) IV, sodium chloride flush  Physical Exam Pulmonary:     Effort: Pulmonary effort is normal.  Neurological:     Mental Status: He is alert.             Vital Signs: BP 120/81 (BP Location: Left Arm)   Pulse 87   Temp 97.9 F (36.6 C)   Resp 18   Ht 6' (1.829 m)   Wt 79.7 kg   SpO2 96%   BMI 23.83 kg/m  SpO2: SpO2: 96 % O2 Device: O2 Device: Nasal Cannula O2 Flow  Rate: O2 Flow Rate (L/min): 2 L/min  Intake/output summary:  Intake/Output Summary (Last 24 hours) at 06/08/2023 1152 Last data filed at 06/07/2023 2200 Gross per 24 hour  Intake 455 ml  Output 3750 ml  Net -3295 ml  LBM: Last BM Date : 06/06/23 Baseline Weight: Weight: 79.7 kg Most recent weight: Weight: 79.7 kg       Patient Active Problem List   Diagnosis Date Noted   Protein-calorie malnutrition, severe 06/07/2023   Small bowel obstruction (HCC) 06/03/2023   Proctitis 06/02/2023   Cough 12/05/2022   Multiple sclerosis (HCC) 12/03/2022   Empyema lung (HCC) 12/03/2022   Community acquired pneumonia of right lung 12/02/2022   Sepsis (HCC) 12/02/2022   Empyema (HCC) 12/02/2022   Lung mass 11/30/2022   Constipation 11/30/2022   Hyponatremia 11/30/2022   Need for home health care 04/20/2021   Acute lower UTI 04/16/2021   Hypophosphatemia 04/16/2021   AKI (acute kidney injury) (HCC) 04/16/2021   Stage IV decubitus ulcer (HCC) 04/16/2021   Multiple sclerosis, primary progressive (HCC) 04/16/2021   SBO (small bowel obstruction) (HCC) 04/10/2021   Bradycardia 06/20/2020   Frequent PVCs 06/20/2020   MS (multiple sclerosis) (HCC) 04/11/2019   Flaccid neuropathic bladder, not elsewhere classified 02/03/2019    Palliative Care Assessment & Plan     Recommendations/Plan: Patient progressing and goals are set.   PMT will shadow peripherally for needs.  Code Status:    Code Status Orders  (From admission, onward)           Start     Ordered   06/02/23 0931  Do not attempt resuscitation (DNR)- Limited -Do Not Intubate (DNI)  Continuous       Question Answer Comment  If pulseless and not breathing No CPR or chest compressions.   In Pre-Arrest Conditions (Patient Is Breathing and Has A Pulse) Do not intubate. Provide all appropriate non-invasive medical interventions. Avoid ICU transfer unless indicated or required.   Consent: Discussion documented in EHR or advanced  directives reviewed      06/02/23 0931           Code Status History     Date Active Date Inactive Code Status Order ID Comments User Context   12/02/2022 1317 12/14/2022 1648 Limited: Do not attempt resuscitation (DNR) -DNR-LIMITED -Do Not Intubate/DNI  [id number]  Erin Fulling, MD Inpatient   11/30/2022 2223 12/02/2022 1317 Full Code [id number]  Verdene Lennert, MD ED   04/10/2021 1531 04/22/2021 2322 Full Code [id number]  Mansy, Vernetta Honey, MD ED   04/10/2021 1523 04/10/2021 1531 Full Code [id number]  Mansy, Vernetta Honey, MD ED      Advance Directive Documentation    Flowsheet Row Most Recent Value  Type of Advance Directive Out of facility DNR (pink MOST or yellow form)  Pre-existing out of facility DNR order (yellow form or pink MOST form) Yellow form placed in chart (order not valid for inpatient use)  "MOST" Form in Place? --       Thank you for allowing the Palliative Medicine Team to assist in the care of this patient.   Morton Stall, NP  Please contact Palliative Medicine Team phone at [phone] for questions and concerns.

## 2023-06-08 NOTE — Consult Note (Signed)
 PHARMACY - TOTAL PARENTERAL NUTRITION CONSULT NOTE   Indication:  bowel obstruction  Patient Measurements: Height: 6' (182.9 cm) Weight: 79.7 kg (175 lb 11.3 oz) IBW/kg (Calculated) : 77.6 TPN AdjBW (KG): 79.7 Body mass index is 23.83 kg/m. 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 Pt admitted with abdominal pain, nausea, and emesis concerning for SBO. NGT was placed on 3/12 but patient pulled tube out. Pharmay has been consulted to place orders for TPN.  Glucose / Insulin: BG 137-150 Hgb a1c 5.2 Dexamethasone 8mg  daily currently ordered sSSI/ 24 hrs:  4 units Electrolytes: Phos 2.5 Hyponatremia Na 132 Renal: Scr<1, stable Hepatic: AST/ALT wnl Intake / Output;  MIVF: none GI Imaging: 3/12 CT abdomen:  1. Diffusely dilated and fluid-filled small bowel with suspected transition point at the terminal ileum. Findings are consistent with small bowel obstruction. 2. Mild rectal wall thickening, which may indicate proctitis. 3. Large amount of distal colonic stool.   GI Surgeries / Procedures:  No emergent surgical intervention needed currently  Central access: PICC placed 3/16 TPN start date: 3/16  Nutritional Goals: Goal compounded TPN rate is 90 mL/hr (provides 129.6 g of protein and 2194 kcals per day)  Due to ongoing fluid shortage, will use Clinimix premade IV bags to supplement nutritional needs for the time being until compounded TPN products are fully replenished.  RD Assessment: Estimated Needs Total Energy Estimated Needs: 2100-2300 Total Protein Estimated Needs: 120-135 grams Total Fluid Estimated Needs: 2-2.2 L  Current Nutrition:  FLD started 3/17    Per surgery note:may need assistance with  feeding secondary to Multiple sclerosis   Plan:  Will begin custom TPN at goal rate of 90 ml/hr today at 1800  -Electrolytes in Clinimix TPN: Na 33mEq/L, K 67mEq/L, Ca 92mEq/L, Mg 2mEq/L, and Phos 54mmol/L. Cl:Ac 1:1 - TPN Contents:  Total calories: 2198 kcal Protein: 129.6 g Dextrose: 17% 367.2 g Lipids: 20 g/L  43.2 g -Add standard MVI and trace elements to TPN -Thiamine 100mg  IV x 7 days -outside TPN(started 3/16, thru 3/22) -Initiate Sensitive q6h SSI and adjust as needed  Monitor TPN labs on Mon/Thurs, daily until stable  Bari Mantis PharmD Clinical Pharmacist 06/08/2023

## 2023-06-08 NOTE — Progress Notes (Signed)
 Nutrition Follow-up  DOCUMENTATION CODES:   Severe malnutrition in context of chronic illness  INTERVENTION:   -Continue Ensure Enlive po BID, each supplement provides 350 kcal and 20 grams of protein -Magic cup TID with meals, each supplement provides 290 kcal and 9 grams of protein  -Feeding assistance with meals -RD will follow for diet advancement and adjust supplement regimen as appropriate -TPN management per pharmacy -Recommend 100 mg thiamine daily x 7 days -Recommend MVI daily -Monitor Mg, K, and Phos and replete as needed secondary to high refeeding risk   NUTRITION DIAGNOSIS:   Severe Malnutrition related to chronic illness (MS) as evidenced by moderate fat depletion, severe fat depletion, moderate muscle depletion, severe muscle depletion.  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  3/15- NGT d/c (pulled out by pt), PICC placed 3/16- advanced to clear liquid diet, TPN initiated 3/18- advanced to full liquid diet.  Reviewed I/O's: -4.1 L x 24 hours and -8.2 L since admission  UOP: 4.6 L x 24 hours  Pt now on a full liquid diet. Per MD, pt with minimal intake as pt requires feeding assistance with meals.   Pt remains on TPN- currently infusing at 42 ml/hr with SMOFLIPID 20%. Regimen provides 1169 kcals and 81 grams protein, which provides 56% of estimated kcal needs and 68% of estimated protein needs.  Case discussed with pharmacist; plan to transition to custom TPN today- rate of 90 ml/hr, which provides 2198 kcals and 130 grams of protein, which meets  100% of estimated kcal and protein needs.   No new wt since last visit.   Medications reviewed and include lovenox, neurontin, protonix, and thimaine.   Labs reviewed: CBGS: 111-150 (inpatient orders for glycemic control are 0-9 units insulin aspart every 6 hours).    Diet Order:   Diet Order             Diet full liquid Fluid consistency: Thin  Diet effective now                   EDUCATION NEEDS:   Not appropriate for education at this time  Skin:  Skin Assessment: Skin Integrity Issues: Skin Integrity Issues:: Other (Comment), Stage IV Stage IV: sacrum Other: skin tear to lt pretibial  Last BM:  06/06/23 (type 7)  Height:   Ht Readings from Last 1 Encounters:  06/03/23 6' (1.829 m)    Weight:   Wt Readings from Last 1 Encounters:  06/03/23 79.7 kg    Ideal Body Weight:  80.9 kg  BMI:  Body mass index is 23.83 kg/m.  Estimated Nutritional Needs:   Kcal:  2100-2300  Protein:  120-135 grams  Fluid:  2-2.2 L    Levada Schilling, RD, LDN, CDCES Registered Dietitian III Certified Diabetes Care and Education Specialist If unable to reach this RD, please use "RD Inpatient" group chat on secure chat between hours of 8am-4 pm daily

## 2023-06-08 NOTE — Progress Notes (Cosign Needed)
 [facility] SURGICAL PROGRESS NOTE (cpt [id number])  Hospital Day(s): 6.   Interval History: Patient seen and examined, no acute events or new complaints overnight. Patient reports he has no abdominal pain this AM. He is mainly worried that he has not been able to drink or eat as easily because he has no one to help him and he is limited by his MS. No fever, chills, nausea, emesis. He is much more alert this AM. He remains without leukocytosis; WBC 5.8K. Hgb to 12.0. Renal function normal; sCr - 0.34; UO - 4600 ccs. He is on CLD; tolerating.    Review of Systems:  Constitutional: denies fever, chills  HEENT: denies cough or congestion  Respiratory: denies any shortness of breath  Cardiovascular: denies chest pain or palpitations  Gastrointestinal: denies abdominal pain, N/V Genitourinary: denies burning with urination or urinary frequency Musculoskeletal: denies pain, decreased motor or sensation   Vital signs in last 24 hours: [min-max] current  Temp:  [97 F (36.1 C)-98.3 F (36.8 C)] 97 F (36.1 C) (03/18 0427) Pulse Rate:  [78-92] 83 (03/18 0427) Resp:  [14-16] 14 (03/18 0427) BP: (116-132)/(75-77) 116/75 (03/18 0427) SpO2:  [98 %-99 %] 98 % (03/18 0427)     Height: 6' (182.9 cm) Weight: 79.7 kg BMI (Calculated): 23.82   Intake/Output last 2 shifts:  03/17 0701 - 03/18 0700 In: 455 [P.O.:455] Out: 4600 [Urine:4600]   Physical Exam:  Constitutional: awake, alert, confusion improved, NAD   HENT: normocephalic without obvious abnormality Eyes: PERRL, EOM's grossly intact and symmetric  Respiratory: breathing non-labored at rest  Cardiovascular: regular rate and sinus rhythm  Gastrointestinal: Abdomen is remains soft, distension improved, he does not appear overtly tender, no rebound/guarding. He is certainly not peritonitic.     Labs:     Latest Ref Rng & Units 06/08/2023    4:45 AM 06/07/2023    2:35 AM 06/06/2023    5:30 PM  CBC  WBC 4.0 - 10.5 K/uL 5.8   7.4  7.5   Hemoglobin 13.0 - 17.0 g/dL 65.7  84.6  96.2   Hematocrit 39.0 - 52.0 % 34.1  31.6  33.9   Platelets 150 - 400 K/uL 197  212  195       Latest Ref Rng & Units 06/08/2023    4:45 AM 06/07/2023    6:24 PM 06/07/2023    2:35 AM  CMP  Glucose 70 - 99 mg/dL 952   841   BUN 8 - 23 mg/dL 15   12   Creatinine 3.24 - 1.24 mg/dL 4.01   0.27   Sodium 253 - 145 mmol/L 132  133  130   Potassium 3.5 - 5.1 mmol/L 4.0  4.8  3.6   Chloride 98 - 111 mmol/L 104   101   CO2 22 - 32 mmol/L 22   22   Calcium 8.9 - 10.3 mg/dL 8.6   8.1      Imaging studies:  No new imaging studies   Assessment/Plan: (ICD-10's: K50.609) 78 y.o. male with likely pSBO, complicated by pertinent comorbidities including MS.   - Would like to advance to FLD; may need assistance with feeding secondary to MS  - We can keep TPN for now, wean if diet advances further   - No need for surgical intervention currently; will follow closely  - No need to replace NGT at this time unless emesis recurs  - Monitor abdominal examination; on-going bowel function   - Pain control prn; antiemetics  prn   - Further management per primary service; we will follow    All of the above findings and recommendations were discussed with the patient, and the medical team, and all of patient's questions were answered to his expressed satisfaction.  -- Lynden Oxford, PA-C [facility] 06/08/2023, 8:35 AM M-F: 7am - 4pm

## 2023-06-08 NOTE — Progress Notes (Signed)
  PROGRESS NOTE    Ronald Santiago  UXL:[id number] DOB: Jan 19, 1946 DOA: 06/02/2023 PCP: Mick Sell, MD  [id number]  LOS: 6 days   Brief hospital course:   Assessment & Plan: Viraaj Vorndran is a 78 y.o. 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 Pt reports progression of moderate to severe generalized abdominal pain  and vomiting since yesterday. No fevers or chills.  Baseline progressive MS-fully bedbound.  No reports of falls or recent trauma.  Does take high-dose ibuprofen daily-roughly 8 tablets.    * SBO (small bowel obstruction) (HCC) Noted acute onset of abdominal pain with SBO on imaging  S/p NGT placement in ER- adjusted by general surgery.  NG tube pulled out by pt on 3/15. --TPN started on 3/16 --advance to full liquid  AMS  --possible delirium, or exacerbated by withdrawal from gabapentin and baclofen (couldn't give due to SBO).  Pt having hallucinations and delusional thoughts. --cont seroquel 50 mg nightly (new)  GI bleeding  Fairly large amount of maroon-coffee ground output concerning for upper GI bleeding Noted regular high dose NSAID use (8 pills of IBF daily) vs.traumatic in setting of NGT placement  Hgb 17.7  Transfuse for hgb < 7  --cont IV PPI  Flaccid neuropathic bladder, not elsewhere classified UTI Baseline SP cath in place s/p replacement within the past 24 hours  Noted concern for UTI.  WBC 17  UA indicative of infection, started on abx, however, urine cx was not ordered on admission. --received aztreonam x1 day f/b ceftriaxone x4 days.  Proctitis + proctitis on imaging --d/c'ed flagyl  Chronic constipation --s/p 3 enemas  Chronic Hyponatremia --Na normalized --monitor   Severe Sepsis (HCC) probably  2/2 Suprapubic cath Meeting sepsis criteria based on HR 100s, WBC 17, lactic acid elevation   MS (multiple sclerosis) (HCC) Baseline History of primary progressive MS, currently bedbound. --cont home oxy --fentanyl PRN  Stage IV decubitus ulcer, ruled out --ulcer had already healed  Hypokalemia Hypophos --monitor and supplement PRN   DVT prophylaxis: Lovenox SQ Code Status: DNR  Family Communication:  Level of care: Telemetry Medical Dispo:   The patient is from: home Anticipated d/c is to: home Anticipated d/c date is: to be determined   Subjective and Interval History:  Tolerating clear liquid diet.  No abdominal pain.   Objective: Vitals:   06/07/23 1911 06/08/23 0427 06/08/23 0845 06/08/23 1627  BP: 122/77 116/75 120/81 114/75  Pulse: 92 83 87 90  Resp: 16 14 18 18   Temp: 98.3 F (36.8 C) (!) 97 F (36.1 C) 97.9 F (36.6 C) 98.2 F (36.8 C)  TempSrc:      SpO2: 98% 98% 96% 98%  Weight:      Height:        Intake/Output Summary (Last 24 hours) at 06/08/2023 2003 Last data filed at 06/08/2023 1842 Gross per 24 hour  Intake 1543.21 ml  Output 3250 ml  Net -1706.79 ml   Filed Weights   06/03/23 0305  Weight: 79.7 kg    Examination:   Constitutional: NAD CV: No cyanosis.   RESP: normal respiratory effort, on 2L   Data Reviewed: I have personally reviewed labs and imaging studies  Time spent: 35 minutes  Darlin Priestly, MD [facility] If 7PM-7AM, please contact night-coverage 06/08/2023, 8:03 PM

## 2023-06-08 NOTE — Plan of Care (Signed)

## 2023-06-09 DIAGNOSIS — K56609 Unspecified intestinal obstruction, unspecified as to partial versus complete obstruction: Secondary | ICD-10-CM | POA: Diagnosis not present

## 2023-06-09 LAB — GLUCOSE, CAPILLARY
Glucose-Capillary: 134 mg/dL — ABNORMAL HIGH (ref 70–99)
Glucose-Capillary: 138 mg/dL — ABNORMAL HIGH (ref 70–99)
Glucose-Capillary: 144 mg/dL — ABNORMAL HIGH (ref 70–99)
Glucose-Capillary: 154 mg/dL — ABNORMAL HIGH (ref 70–99)

## 2023-06-09 LAB — BASIC METABOLIC PANEL
Anion gap: 3 — ABNORMAL LOW (ref 5–15)
BUN: 21 mg/dL (ref 8–23)
CO2: 22 mmol/L (ref 22–32)
Calcium: 8.3 mg/dL — ABNORMAL LOW (ref 8.9–10.3)
Chloride: 104 mmol/L (ref 98–111)
Creatinine, Ser: 0.4 mg/dL — ABNORMAL LOW (ref 0.61–1.24)
GFR, Estimated: >60 mL/min (ref >60)
Glucose, Bld: 148 mg/dL — ABNORMAL HIGH (ref 70–99)
Potassium: 4.4 mmol/L (ref 3.5–5.1)
Sodium: 129 mmol/L — ABNORMAL LOW (ref 135–145)

## 2023-06-09 LAB — MAGNESIUM: Magnesium: 1.9 mg/dL (ref 1.7–2.4)

## 2023-06-09 LAB — PHOSPHORUS: Phosphorus: 2.8 mg/dL (ref 2.5–4.6)

## 2023-06-09 LAB — TRIGLYCERIDES: Triglycerides: 129 mg/dL (ref <150)

## 2023-06-09 MED ORDER — DEXTROSE 70 % IV SOLN
INTRAVENOUS | Status: DC
  Filled 2023-06-09: qty 648

## 2023-06-09 MED ORDER — TRAVASOL 10 % IV SOLN
INTRAVENOUS | Status: AC
  Filled 2023-06-09: qty 648

## 2023-06-09 NOTE — Plan of Care (Signed)
  Problem: Education: Goal: Knowledge of General Education information will improve Description: Including pain rating scale, medication(s)/side effects and non-pharmacologic comfort measures Outcome: Progressing   Problem: Clinical Measurements: Goal: Ability to maintain clinical measurements within normal limits will improve Outcome: Progressing   Problem: Activity: Goal: Risk for activity intolerance will decrease Outcome: Progressing   Problem: Nutrition: Goal: Adequate nutrition will be maintained Outcome: Progressing   Problem: Coping: Goal: Level of anxiety will decrease Outcome: Progressing   Problem: Elimination: Goal: Will not experience complications related to bowel motility Outcome: Progressing   Problem: Pain Managment: Goal: General experience of comfort will improve and/or be controlled Outcome: Progressing   Problem: Safety: Goal: Ability to remain free from injury will improve Outcome: Progressing   Problem: Skin Integrity: Goal: Risk for impaired skin integrity will decrease Outcome: Progressing   Problem: Education: Goal: Ability to identify signs and symptoms of gastrointestinal bleeding will improve Outcome: Progressing   Problem: Clinical Measurements: Goal: Complications related to the disease process, condition or treatment will be avoided or minimized Outcome: Progressing

## 2023-06-09 NOTE — Consult Note (Addendum)
 PHARMACY - TOTAL PARENTERAL NUTRITION CONSULT NOTE   Indication:  bowel obstruction  Patient Measurements: Height: 6' (182.9 cm) Weight: 79.7 kg (175 lb 11.3 oz) IBW/kg (Calculated) : 77.6 TPN AdjBW (KG): 79.7 Body mass index is 23.83 kg/m. 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 Pt admitted with abdominal pain, nausea, and emesis concerning for SBO. NGT was placed on 3/12 but patient pulled tube out. Pharmay has been consulted to place orders for TPN.  Patient advanced to full liquid diet at some point between 3/17-3/18, and TPN wean will begin today.  Glucose / Insulin: BG 139-148 Hgb a1c 5.2 sSSI/ 24 hrs: 4 units Electrolytes: Phos 2.8 Hyponatremia Na 133>132>129 Potassium and phosphorus are stable Renal: Scr<1, stable Hepatic: AST/ALT wnl Intake / Output: Patient now on full liquid diet. Net negative 10.3 L, UOP 1.9 mL/kg/hr MIVF: none GI Imaging: 3/12 CT abdomen:  1. Diffusely dilated and fluid-filled small bowel with suspected transition point at the terminal ileum. Findings are consistent with small bowel obstruction. 2. Mild rectal wall thickening, which may indicate proctitis. 3. Large amount of distal colonic stool.   GI Surgeries / Procedures:  No emergent surgical intervention needed currently  Central access: PICC placed 3/16 TPN start date: 3/16  Nutritional Goals: Goal compounded TPN rate is 90 mL/hr (provides 129.6 g of protein and 2194 kcals per day)  Due to ongoing fluid shortage, will use Clinimix premade IV bags to supplement nutritional needs for the time being until compounded TPN products are fully replenished.  RD Assessment: Estimated Needs Total Energy Estimated Needs:  2100-2300 Total Protein Estimated Needs: 120-135 grams Total Fluid Estimated Needs: 2-2.2 L  Current Nutrition:  FLD started 3/17 Per surgery note:may need assistance with feeding secondary to Multiple sclerosis   Plan:  -Begin weaning TPN today by reducing rate from 90 mL/hr to 45 mL/hr -Electrolytes in TPN: Na 48mEq/L, K 64mEq/L, Ca 73mEq/L, Mg 69mEq/L, and Phos 71mmol/L. Cl:Ac 1:1 -TPN Contents:  Total calories: 2198 kcal Protein: 129.6 g Dextrose: 17% 367.2 g Lipids: 20 g/L  43.2 g -Add standard MVI and trace elements to TPN -Thiamine 100mg  IV x 7 days -outside TPN(started 3/16, thru 3/22) -Initiate Sensitive q6h SSI and adjust as needed  -Monitor TPN labs on Mon/Thurs, daily until stable   Will M. Dareen Piano, PharmD Clinical Pharmacist 06/09/2023 8:06 AM

## 2023-06-09 NOTE — Progress Notes (Signed)
 [facility] SURGICAL PROGRESS NOTE (cpt [phone])  Hospital Day(s): 7.   Interval History: Patient seen and examined, no acute events or new complaints overnight. Patient reports he is uncomfortable this AM from positioning. No abdominal complaints. No fever, chills, nausea. Labs remain reassuring aside from hyponatremia to 129. He was advanced to FLD; tolerating. He continues to endorse bowel function   Review of Systems:  Constitutional: denies fever, chills  HEENT: denies cough or congestion  Respiratory: denies any shortness of breath  Cardiovascular: denies chest pain or palpitations  Gastrointestinal: denies abdominal pain, N/V Genitourinary: denies burning with urination or urinary frequency Musculoskeletal: denies pain, decreased motor or sensation   Vital signs in last 24 hours: [min-max] current  Temp:  [97.9 F (36.6 C)-98.4 F (36.9 C)] 98.4 F (36.9 C) (03/19 0605) Pulse Rate:  [86-95] 95 (03/19 0605) Resp:  [18-19] 19 (03/19 0605) BP: (107-120)/(71-81) 112/71 (03/19 0605) SpO2:  [95 %-98 %] 95 % (03/19 0605)     Height: 6' (182.9 cm) Weight: 79.7 kg BMI (Calculated): 23.82   Intake/Output last 2 shifts:  03/18 0701 - 03/19 0700 In: 1543.2 [P.O.:600; I.V.:943.2] Out: 3600 [Urine:3600]   Physical Exam:  Constitutional: awake, alert, confusion improved, NAD   HENT: normocephalic without obvious abnormality Eyes: PERRL, EOM's grossly intact and symmetric  Respiratory: breathing non-labored at rest  Cardiovascular: regular rate and sinus rhythm  Gastrointestinal: Abdomen is remains soft, distension improved, he does not appear overtly tender, no rebound/guarding. He is certainly not peritonitic.     Labs:     Latest Ref Rng & Units 06/08/2023    4:45 AM 06/07/2023    2:35 AM 06/06/2023    5:30 PM  CBC  WBC 4.0 - 10.5 K/uL 5.8  7.4  7.5   Hemoglobin 13.0 - 17.0 g/dL 40.1  02.7  25.3   Hematocrit 39.0 - 52.0 % 34.1  31.6  33.9   Platelets 150 - 400  K/uL 197  212  195       Latest Ref Rng & Units 06/09/2023    3:36 AM 06/08/2023    4:45 AM 06/07/2023    6:24 PM  CMP  Glucose 70 - 99 mg/dL 664  403    BUN 8 - 23 mg/dL 21  15    Creatinine 4.74 - 1.24 mg/dL 2.59  5.63    Sodium 875 - 145 mmol/L 129  132  133   Potassium 3.5 - 5.1 mmol/L 4.4  4.0  4.8   Chloride 98 - 111 mmol/L 104  104    CO2 22 - 32 mmol/L 22  22    Calcium 8.9 - 10.3 mg/dL 8.3  8.6       Imaging studies:  No new imaging studies    Assessment/Plan: (ICD-10's: K56.609) 78 y.o. male with likely clinically resolving pSBO, complicated by pertinent comorbidities including MS.   - We can advance to soft diet; He does struggle to feed independently from MS  - We can wean TPN to half rate today    - No need for surgical intervention currently; will follow closely  - No need to replace NGT at this time unless emesis recurs  - Monitor abdominal examination; on-going bowel function   - Pain control prn; antiemetics prn   - Further management per primary service;we will follow   - Discharge Planning; If he tolerates diet advancement, and we can wean TPN in next 24 hours, he can potentially DC in 24-48 hours from surgical  perspective.    All of the above findings and recommendations were discussed with the patient, and the medical team, and all of patient's questions were answered to his expressed satisfaction.  -- Lynden Oxford, PA-C [facility] 06/09/2023, 7:26 AM M-F: 7am - 4pm

## 2023-06-09 NOTE — Progress Notes (Signed)
 PROGRESS NOTE    Ronald Santiago  ZOX:[id number] DOB: [birth year] DOA: 06/02/2023 PCP: Mick Sell, MD  [id number]/[id number]-AA  LOS: 7 days   Brief hospital course:   Assessment & Plan: Elizardo Chilson is a 78 y.o. 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 Pt reports progression of moderate to severe generalized abdominal pain  and vomiting since yesterday. No fevers or chills.  Baseline progressive MS-fully bedbound.  No reports of falls or recent trauma.  Does take high-dose ibuprofen daily-roughly 8 tablets.    * SBO (small bowel obstruction) (HCC) Noted acute onset of abdominal pain with SBO on imaging  S/p NGT placement in ER- adjusted by general surgery.  NG tube pulled out by pt on 3/15. --TPN started on 3/16 --advance to dys 3 diet --wean TPN  AMS  --possible delirium, or exacerbated by withdrawal from gabapentin and baclofen (couldn't give due to SBO).  Pt having hallucinations and delusional thoughts. --cont seroquel 50 mg nightly (new)  GI bleeding  Fairly large amount of maroon-coffee ground output concerning for upper GI bleeding Noted regular high dose NSAID use (8 pills of IBF daily) vs.traumatic in setting of NGT placement  Hgb 17.7  Transfuse for hgb < 7  --d/c IV PPI  Flaccid neuropathic bladder, not elsewhere classified UTI Baseline SP cath in place s/p replacement within the past 24 hours  Noted concern for UTI.  WBC 17  UA indicative of infection, started on abx, however, urine cx was not ordered on admission. --received aztreonam x1 day f/b ceftriaxone x4 days.  Proctitis + proctitis on imaging --d/c'ed flagyl  Chronic constipation --s/p 3 enemas  Chronic Hyponatremia --Na normalized --monitor   Severe Sepsis (HCC) probably  2/2 Suprapubic cath Meeting sepsis criteria based on HR 100s, WBC 17, lactic acid elevation   MS (multiple sclerosis) (HCC) Baseline History of primary progressive MS, currently bedbound. --cont home oxy --fentanyl PRN  Stage IV decubitus ulcer, ruled out --ulcer had already healed  Hypokalemia Hypophos --monitor and supplement PRN   DVT prophylaxis: Lovenox SQ Code Status: DNR  Family Communication:  Level of care: Telemetry Medical Dispo:   The patient is from: home Anticipated d/c is to: home Anticipated d/c date is: 1-2 days   Subjective and Interval History:  Pt complained of pain in his neck and shoulders due to positioning.     Objective: Vitals:   06/08/23 1627 06/08/23 1900 06/09/23 0605 06/09/23 1801  BP: 114/75 107/76 112/71 119/66  Pulse: 90 86 95 (!) 109  Resp: 18 18 19 18   Temp: 98.2 F (36.8 C) 98.4 F (36.9 C) 98.4 F (36.9 C) 97.9 F (36.6 C)  TempSrc:  Oral    SpO2: 98% 97% 95% 95%  Weight:      Height:        Intake/Output Summary (Last 24 hours) at 06/09/2023 2040 Last data filed at 06/09/2023 1906 Gross per 24 hour  Intake 3140.03 ml  Output 2550 ml  Net 590.03 ml   Filed Weights   06/03/23 0305  Weight: 79.7 kg    Examination:   Constitutional: NAD, alert HEENT: conjunctivae and lids normal, EOMI CV: No cyanosis.   RESP: normal respiratory effort Psych: depressed mood and affect.     Data Reviewed: I have personally reviewed labs and imaging studies  Time spent: 35 minutes  Ronald Priestly, MD [facility] If 7PM-7AM, please contact night-coverage 06/09/2023, 8:40 PM

## 2023-06-10 DIAGNOSIS — K56609 Unspecified intestinal obstruction, unspecified as to partial versus complete obstruction: Secondary | ICD-10-CM | POA: Diagnosis not present

## 2023-06-10 LAB — COMPREHENSIVE METABOLIC PANEL
ALT: 55 U/L — ABNORMAL HIGH (ref 0–44)
AST: 23 U/L (ref 15–41)
Albumin: 3.1 g/dL — ABNORMAL LOW (ref 3.5–5.0)
Alkaline Phosphatase: 45 U/L (ref 38–126)
Anion gap: 7 (ref 5–15)
BUN: 21 mg/dL (ref 8–23)
CO2: 23 mmol/L (ref 22–32)
Calcium: 8.6 mg/dL — ABNORMAL LOW (ref 8.9–10.3)
Chloride: 101 mmol/L (ref 98–111)
Creatinine, Ser: 0.34 mg/dL — ABNORMAL LOW (ref 0.61–1.24)
GFR, Estimated: >60 mL/min (ref >60)
Glucose, Bld: 129 mg/dL — ABNORMAL HIGH (ref 70–99)
Potassium: 4.2 mmol/L (ref 3.5–5.1)
Sodium: 131 mmol/L — ABNORMAL LOW (ref 135–145)
Total Bilirubin: 0.4 mg/dL (ref 0.0–1.2)
Total Protein: 7 g/dL (ref 6.5–8.1)

## 2023-06-10 LAB — PHOSPHORUS: Phosphorus: 3 mg/dL (ref 2.5–4.6)

## 2023-06-10 LAB — GLUCOSE, CAPILLARY
Glucose-Capillary: 125 mg/dL — ABNORMAL HIGH (ref 70–99)
Glucose-Capillary: 144 mg/dL — ABNORMAL HIGH (ref 70–99)
Glucose-Capillary: 145 mg/dL — ABNORMAL HIGH (ref 70–99)
Glucose-Capillary: 150 mg/dL — ABNORMAL HIGH (ref 70–99)

## 2023-06-10 LAB — MAGNESIUM: Magnesium: 1.9 mg/dL (ref 1.7–2.4)

## 2023-06-10 MED ORDER — POLYETHYLENE GLYCOL 3350 17 G PO PACK
34.0000 g | PACK | Freq: Two times a day (BID) | ORAL | Status: DC
  Administered 2023-06-10 – 2023-06-11 (×3): 34 g via ORAL
  Filled 2023-06-10 (×3): qty 2

## 2023-06-10 MED ORDER — ADULT MULTIVITAMIN W/MINERALS CH
1.0000 | ORAL_TABLET | Freq: Every day | ORAL | Status: DC
  Administered 2023-06-11: 1 via ORAL
  Filled 2023-06-10: qty 1

## 2023-06-10 MED ORDER — SMOG ENEMA
960.0000 mL | Freq: Once | RECTAL | Status: AC
  Administered 2023-06-10: 960 mL via RECTAL
  Filled 2023-06-10: qty 240

## 2023-06-10 MED ORDER — VITAMIN C 500 MG PO TABS
500.0000 mg | ORAL_TABLET | Freq: Two times a day (BID) | ORAL | Status: DC
  Administered 2023-06-11: 500 mg via ORAL
  Filled 2023-06-10: qty 1

## 2023-06-10 MED ORDER — ZINC SULFATE 220 (50 ZN) MG PO CAPS
220.0000 mg | ORAL_CAPSULE | Freq: Every day | ORAL | Status: DC
Start: 2023-06-11 — End: 2023-06-25
  Administered 2023-06-11: 220 mg via ORAL
  Filled 2023-06-10: qty 1

## 2023-06-10 NOTE — Progress Notes (Signed)
 Nutrition Follow-up  DOCUMENTATION CODES:   Severe malnutrition in context of chronic illness  INTERVENTION:   -Obtain new wt -Continue Ensure Enlive po BID, each supplement provides 350 kcal and 20 grams of protein -Magic cup TID with meals, each supplement provides 290 kcal and 9 grams of protein  -Continue feeding assistance with meals -MVI with minerals daily -500 mg vitamin C BID -220 mg zinc sulfate daily x 14 days  NUTRITION DIAGNOSIS:   Severe Malnutrition related to chronic illness (MS) as evidenced by moderate fat depletion, severe fat depletion, moderate muscle depletion, severe muscle depletion.  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  3/15- NGT d/c (pulled out by pt), PICC placed 3/16- advanced to clear liquid diet, TPN initiated 3/18- advanced to full liquid diet 3/19- advanced to dysphagia 3 diet  Reviewed I/O's: +1.2 L x 24 hours and -9.1 L since admission  UOP: 2.4 L x 24 hours   Pt sitting up in bed eating breakfast, receiving nursing care at bedside. Pt with good appetite, consuming 50-75% of meals.   Case discussed with MD and pharmacist. TPN was weaned to 45 ml/hr, which provides 1049 kcals and 65 grams protein, meeting 50% of estimated kcal needs and 54% of estimated protein needs. Plan to d/c TPN today after bag has ran out. Noted pt with likely be able to discharge home in 1-2 days.   No new wt since admission.   Medications reviewed and include lovenox, neurontin, miralax, and thiamine.   Labs reviewed: CBGS: 125-154 (inpatient  orders for glycemic control are 0-9 units insulin aspart every 6 hours).    Diet Order:   Diet Order             DIET DYS 3 Room service appropriate? Yes; Fluid consistency: Thin  Diet effective now                   EDUCATION NEEDS:   Not appropriate for education at this time  Skin:  Skin Assessment: Skin Integrity Issues: Skin Integrity Issues:: Other (Comment), Stage IV Stage IV: sacrum Other: skin tear to lt pretibial  Last BM:  06/06/23 (type 7)  Height:   Ht Readings from Last 1 Encounters:  06/03/23 6' (1.829 m)    Weight:   Wt Readings from Last 1 Encounters:  06/03/23 79.7 kg    Ideal Body Weight:  80.9 kg  BMI:  Body mass index is 23.83 kg/m.  Estimated Nutritional Needs:   Kcal:  2100-2300  Protein:  120-135 grams  Fluid:  2-2.2 L    Levada Schilling, RD, LDN, CDCES Registered Dietitian III Certified Diabetes Care and Education Specialist If unable to reach this RD, please use "RD Inpatient" group chat on secure chat between hours of 8am-4 pm daily

## 2023-06-10 NOTE — Progress Notes (Signed)
 PROGRESS NOTE    Ronald Santiago  QMV:[id number] DOB: 1945-11-29 DOA: 06/02/2023 PCP: Mick Sell, MD  107A/107A-AA  LOS: 8 days   Brief hospital course:   Assessment & Plan: Kethan Papadopoulos is a 78 y.o. 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 Pt reports progression of moderate to severe generalized abdominal pain  and vomiting since yesterday. No fevers or chills.  Baseline progressive MS-fully bedbound.  No reports of falls or recent trauma.  Does take high-dose ibuprofen daily-roughly 8 tablets.    * SBO (small bowel obstruction) (HCC) Noted acute onset of abdominal pain with SBO on imaging  S/p NGT placement in ER- adjusted by general surgery.  NG tube pulled out by pt on 3/15. --TPN started on 3/16 --cont dys 3 diet --wean off TPN tonight  AMS  --possible delirium, or exacerbated by withdrawal from gabapentin and baclofen (couldn't give due to SBO).  Pt having hallucinations and delusional thoughts. --cont seroquel 50 mg nightly (new)  GI bleeding  Fairly large amount of maroon-coffee ground output concerning for upper GI bleeding Noted regular high dose NSAID use (8 pills of IBF daily) vs.traumatic in setting of NGT placement  Hgb 17.7  Transfuse for hgb < 7   Flaccid neuropathic bladder, not elsewhere classified UTI Baseline SP cath in place s/p replacement within the past 24 hours  Noted concern for UTI.  WBC 17  UA indicative of infection, started on abx, however, urine cx was not ordered on admission. --received aztreonam x1 day f/b ceftriaxone x4 days.  Proctitis + proctitis on imaging --d/c'ed flagyl  Chronic constipation --s/p 3 enemas --4th enema today --start scheduled miralax  Chronic Hyponatremia --Na  normalized --monitor   Severe Sepsis (HCC) probably 2/2 Suprapubic cath Meeting sepsis criteria based on HR 100s, WBC 17, lactic acid elevation   MS (multiple sclerosis) (HCC) Baseline History of primary progressive MS, currently bedbound. --cont home oxy --fentanyl PRN  Stage IV decubitus ulcer, ruled out --ulcer had already healed  Hypokalemia Hypophos --monitor and supplement PRN   DVT prophylaxis: Lovenox SQ Code Status: DNR  Family Communication:  Level of care: Telemetry Medical Dispo:   The patient is from: home Anticipated d/c is to: home Anticipated d/c date is: tomorrow   Subjective and Interval History:  Pt had BM after enema.   Objective: Vitals:   06/10/23 0420 06/10/23 0814 06/10/23 1627 06/10/23 2021  BP: 97/69 100/65 115/62 117/66  Pulse: 94 89 95 98  Resp:  18 19 18   Temp: 98.4 F (36.9 C) 98.2 F (36.8 C) 98.6 F (37 C) 99 F (37.2 C)  TempSrc:  Oral  Oral  SpO2: 97% 98% 94% 95%  Weight:      Height:        Intake/Output Summary (Last 24 hours) at 06/10/2023 2127 Last data filed at 06/10/2023 1237 Gross per 24 hour  Intake 435.24 ml  Output 2325 ml  Net -1889.76 ml   Filed Weights   06/03/23 0305  Weight: 79.7 kg    Examination:   Constitutional: NAD, alert, oriented to person and place HEENT: conjunctivae and lids normal, EOMI CV: No cyanosis.   RESP: normal respiratory effort   Data Reviewed: I have personally reviewed labs and imaging studies  Time spent: 35 minutes  Darlin Priestly, MD [facility] If 7PM-7AM, please  contact night-coverage 06/10/2023, 9:27 PM

## 2023-06-10 NOTE — Plan of Care (Signed)
  Problem: Education: Goal: Knowledge of General Education information will improve Description: Including pain rating scale, medication(s)/side effects and non-pharmacologic comfort measures Outcome: Progressing   Problem: Health Behavior/Discharge Planning: Goal: Ability to manage health-related needs will improve Outcome: Progressing   Problem: Clinical Measurements: Goal: Ability to maintain clinical measurements within normal limits will improve Outcome: Progressing   Problem: Activity: Goal: Risk for activity intolerance will decrease Outcome: Progressing   Problem: Nutrition: Goal: Adequate nutrition will be maintained Outcome: Progressing   Problem: Coping: Goal: Level of anxiety will decrease Outcome: Progressing   Problem: Pain Managment: Goal: General experience of comfort will improve and/or be controlled Outcome: Progressing   Problem: Safety: Goal: Ability to remain free from injury will improve Outcome: Progressing   Problem: Skin Integrity: Goal: Risk for impaired skin integrity will decrease Outcome: Progressing   Problem: Education: Goal: Ability to identify signs and symptoms of gastrointestinal bleeding will improve Outcome: Progressing   Problem: Bowel/Gastric: Goal: Will show no signs and symptoms of gastrointestinal bleeding Outcome: Progressing   Problem: Clinical Measurements: Goal: Complications related to the disease process, condition or treatment will be avoided or minimized Outcome: Progressing

## 2023-06-10 NOTE — Plan of Care (Signed)
 PMT shadowing.  Patient's diet is progressing and he is weaning from TPN.  Will continue to shadow.  Please reach out for immediate needs.

## 2023-06-11 DIAGNOSIS — K56609 Unspecified intestinal obstruction, unspecified as to partial versus complete obstruction: Secondary | ICD-10-CM | POA: Diagnosis not present

## 2023-06-11 LAB — GLUCOSE, CAPILLARY: Glucose-Capillary: 112 mg/dL — ABNORMAL HIGH (ref 70–99)

## 2023-06-11 MED ORDER — POLYETHYLENE GLYCOL 3350 17 G PO PACK: 17.0000 g | PACK | Freq: Two times a day (BID) | ORAL | Status: AC

## 2023-06-11 MED ORDER — CLONAZEPAM 1 MG PO TABS: 1.0000 mg | ORAL_TABLET | Freq: Every evening | ORAL | Status: AC | PRN

## 2023-06-11 MED ORDER — ENSURE ENLIVE PO LIQD: 237.0000 mL | Freq: Two times a day (BID) | ORAL | Status: AC

## 2023-06-11 NOTE — TOC Transition Note (Addendum)
 Transition of Care [facility]) - Discharge Note   Patient Details  Name: Ronald Santiago MRN: [id number] Date of Birth: [birth year]  [facility] CM/SW Contact:  Cherre Blanc, RN Phone Number: 06/11/2023, 10:16 AM   Clinical Narrative:     Patient to return to home with his wife and continue [facility] nursing for wound care w/ [facility]. TOC spoke with [place] [phone] at [facility] and they will resume service.  [facility] called to transport patient to home. TCT wife Angelique Blonder [phone]. His wife is agreeable with the dc plan.         Patient Goals and CMS Choice            Discharge Placement    Home with [facility]             Discharge Plan and Services Additional resources added to the After Visit Summary for                                       Social Drivers of Health (SDOH) Interventions SDOH Screenings   Food Insecurity: No Food Insecurity (06/03/2023)  Housing: Low Risk  (06/03/2023)  Transportation Needs: No Transportation Needs (06/03/2023)  Utilities: Not At Risk (06/03/2023)  Financial Resource Strain: Low Risk  (03/05/2023)   Received from [facility]  Social Connections: Unknown (06/03/2023)  Tobacco Use: Low Risk  (06/02/2023)     Readmission Risk Interventions     No data to display

## 2023-06-11 NOTE — Discharge Summary (Signed)
 Physician Discharge Summary   Ronald Santiago  male DOB: 1945/08/05  NFA:[id number]  PCP: Mick Sell, MD  Admit date: 06/02/2023 Discharge date: 06/11/2023  Admitted From: home Disposition:  home Home Health: Yes CODE STATUS: DNR  Discharge Instructions     Discharge wound care:   Complete by: As directed    Cleanse leg wounds with water, pat dry Cover with single layer of xeroform gauze, top with foam Change every other day [facility] Course:  For full details, please see H&P, progress notes, consult notes and ancillary notes.  Briefly,  Ronald Santiago is a 78 y.o. 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    * SBO (small bowel obstruction) (HCC) Noted acute onset of abdominal pain with SBO on imaging. S/p NGT placement in ER- adjusted by general surgery.  NG tube pulled out by pt on 3/15. --TPN started on 3/16.  Pt was slowly advanced on diet, and tolerated dys 3 diet prior to discharge.  TPN weaned off prior to discharge.   AMS 2/2 Delirium --Pt having hallucinations and delusional thoughts.  likely delirium, possibly exacerbated by withdrawal from gabapentin and baclofen (couldn't give initially due to SBO).   --ordered seroquel 50 mg nightly.  Mental status appeared to have improved prior to discharge.   GI bleeding, ruled out Some initial report of Fairly large amount of maroon-coffee ground output concerning for upper GI bleeding, however, no GI bleed observed after admission.   Flaccid neuropathic bladder, with chronic suprapubic cath UTI Baseline SP cath in place s/p replacement within the past 24 hours  Noted concern for UTI.  WBC 17  UA indicative of infection, started on abx, however, urine cx was not ordered on admission. --received aztreonam x1 day f/b  ceftriaxone x4 days.   Proctitis + proctitis on imaging, possibly due to severe constipation.   --flagyl started on presentation, since d/c'ed.   Chronic constipation --s/p 4 enemas --started on scheduled miralax once able to take oral.   Chronic Hyponatremia --Na low 130's.   Severe Sepsis (HCC) probably 2/2 UTI Meeting sepsis criteria based on HR 100s, WBC 17, lactic acid elevation   MS (multiple sclerosis) (HCC) Baseline History of primary progressive MS, currently bedbound. --cont home oxy   Stage IV decubitus ulcer, ruled out --ulcer had already healed   Hypokalemia Hypophos --monitored and supplemented PRN   Discharge Diagnoses:  Principal Problem:   SBO (small bowel obstruction) (HCC) Active Problems:   Flaccid neuropathic bladder, not elsewhere classified   Hyponatremia   Proctitis   Stage IV decubitus ulcer (HCC)   MS (multiple sclerosis) (HCC)   Sepsis (HCC)   Small bowel obstruction (HCC)   Protein-calorie malnutrition, severe   30 Day Unplanned Readmission Risk Score    Flowsheet Row ED to Hosp-Admission (Current) from 06/02/2023 in [facility] 1C MEDICAL TELEMETRY  30 Day Unplanned Readmission Risk Score (%) 17.53 Filed at 06/11/2023 0801       This score is the patient's risk of an unplanned readmission within 30 days of being discharged (0 -100%). The score is based on dignosis, age, lab data, medications, orders, and past utilization.   Low:  0-14.9   Medium: 15-21.9   High: 22-29.9   Extreme: 30 and above         Discharge Instructions:  Allergies as of 06/11/2023       Reactions   Cephalexin Anaphylaxis, Hives   Pt tolerated several doses of cefepime during hospitalization in 03/2021. Also received ceftriaxone, ampicillin/sulbactam and amoxicillin/clavulanate in past        Medication List     STOP taking these medications    ibuprofen 200 MG tablet Commonly known as: ADVIL   Pumpkin Seed 500 MG Caps        TAKE these medications    baclofen 20 MG tablet Commonly known as: LIORESAL Take 20-40 mg by mouth 4 (four) times daily.   bisacodyl 5 MG EC tablet Commonly known as: DULCOLAX Take 5 mg by mouth daily as needed for moderate constipation.   cetirizine 10 MG tablet Commonly known as: ZYRTEC Take 10 mg by mouth every other day. Rotates this out with Claritin every other day   clonazePAM 1 MG tablet Commonly known as: KLONOPIN Take 1 tablet (1 mg total) by mouth at bedtime as needed. Home med. What changed:  when to take this additional instructions   D3-1000 PO Take 1,000 Units by mouth daily.   docusate sodium 100 MG capsule Commonly known as: COLACE Take 400 mg by mouth at bedtime.   feeding supplement Liqd Take 237 mLs by mouth 2 (two) times daily between meals.   gabapentin 600 MG tablet Commonly known as: NEURONTIN Take 1,200 mg by mouth 3 (three) times daily.   guaiFENesin 600 MG 12 hr tablet Commonly known as: MUCINEX Take 600 mg by mouth 2 (two) times daily as needed for cough or to loosen phlegm.   loratadine 10 MG tablet Commonly known as: CLARITIN Take 10 mg by mouth every other day.   magnesium oxide 400 MG tablet Commonly known as: MAG-OX Take 1 tablet by mouth daily.   Melatonin 10 MG Tabs Take 10 mg by mouth daily as needed.   Movantik 25 MG Tabs tablet Generic drug: naloxegol oxalate Take 25 mg by mouth daily.   multivitamin with minerals Tabs tablet Take 1 tablet by mouth daily.   nystatin powder Commonly known as: MYCOSTATIN/NYSTOP SMARTSIG:1 Application Topical 2-3 Times Daily   oxybutynin 10 MG 24 hr tablet Commonly known as: DITROPAN-XL Take 1 tablet (10 mg total) by mouth 3 (three) times daily.   oxyCODONE 5 MG immediate release tablet Commonly known as: Oxy IR/ROXICODONE Take 5 mg by mouth every 4 (four) hours as needed for moderate pain (pain score 4-6) or severe pain (pain score 7-10).   phenylephrine 10 MG Tabs  tablet Commonly known as: SUDAFED PE Take 10 mg by mouth every 4 (four) hours as needed.   polyethylene glycol 17 g packet Commonly known as: MIRALAX / GLYCOLAX Take 17 g by mouth 2 (two) times daily.   PROBIOTIC-10 PO Take 1 capsule by mouth daily.   ZINC PLUS VITAMIN C PO Take 1 tablet by mouth 2 (two) times daily.               Discharge Care Instructions  (From admission, onward)           Start     Ordered   06/11/23 0000  Discharge wound care:       Comments: Cleanse leg wounds with water, pat dry Cover with single layer of xeroform gauze, top with foam Change every other day - -   06/11/23 1052             Follow-up Information     Mick Sell, MD  Follow up in 1 week(s).   Specialty: Infectious Diseases Why: Hospital follow up Contact information: [address] [place] [address] [phone]         Mick Sell, MD Follow up in 1 week(s).   Specialty: Infectious Diseases Contact information: [address] [place] [address] [phone]                 Allergies  Allergen Reactions   Cephalexin Anaphylaxis and Hives    Pt tolerated several doses of cefepime during hospitalization in 03/2021. Also received ceftriaxone, ampicillin/sulbactam and amoxicillin/clavulanate in past     The results of significant diagnostics from this hospitalization (including imaging, microbiology, ancillary and laboratory) are listed below for reference.   Consultations:   Procedures/Studies: [place] EKG SITE RITE Result Date: 06/05/2023 If Site Rite image not attached, placement could not be confirmed due to current cardiac rhythm.  DG Abd Portable 2V Result Date: 06/05/2023 CLINICAL DATA:  Small-bowel obstruction.  Follow-up study. EXAM: PORTABLE ABDOMEN - 2 VIEW COMPARISON:  06/04/2023 and older exams. CT head 06/02/2023 FINDINGS: Small amount of contrast noted in the stomach. NG tube is well positioned,  tip region of the gastric fundus. No visualized small bowel or colonic contrast. Small bowel remains dilated similar to the previous day's exam. There is no colonic dilation. IMPRESSION: 1. No significant change from the previous day's study. 2. Persistent small bowel dilation consistent with small bowel obstruction. No colonic dilation. Electronically Signed   By: Amie Portland M.D.   On: 06/05/2023 11:01   [place] EKG SITE RITE Result Date: 06/04/2023 If Site Rite image not attached, placement could not be confirmed due to current cardiac rhythm.  DG Abd Portable 2V Result Date: 06/04/2023 CLINICAL DATA:  Small bowel obstruction. EXAM: PORTABLE ABDOMEN - 2 VIEW COMPARISON:  06/03/2023 FINDINGS: Nasogastric tube tip is in the stomach. Again noted is contrast in the stomach. Persistent dilated loops of small bowel throughout the abdomen. The small bowel gaseous distention has minimally changed. Again noted are cholecystectomy clips. Gas and stool in the colon. IMPRESSION: 1. Dilated loops of small bowel are not significantly changed. Findings are suggestive for persistent bowel obstruction or ileus. Electronically Signed   By: Richarda Overlie M.D.   On: 06/04/2023 10:29   DG Abd Portable 2V Result Date: 06/03/2023 CLINICAL DATA:  Small bowel obstruction EXAM: PORTABLE ABDOMEN - 2 VIEW COMPARISON:  Yesterday FINDINGS: Unchanged diffuse gaseous dilatation of small bowel. An enteric tube tip and side-port is at the stomach. No contrast progression seen beyond the stomach. No concerning mass effect or gas collection. IMPRESSION: Unchanged gas dilated small bowel. No significant progression of contrast from the stomach. Electronically Signed   By: Tiburcio Pea M.D.   On: 06/03/2023 08:12   DG Abd Portable 1V-Small Bowel Obstruction Protocol-initial, 8 hr delay Result Date: 06/02/2023 CLINICAL DATA:  Small bowel obstruction, 8 hour delay. EXAM: PORTABLE ABDOMEN - 1 VIEW COMPARISON:  Radiograph earlier today.  CT  earlier today FINDINGS: There is no enteric contrast in the colon. Contrast remains in the stomach. There is persistent prominent gas-filled small bowel in the central abdomen. Formed stool in the left colon. IMPRESSION: No enteric contrast in the colon. Persistent prominent gas-filled small bowel in the central abdomen. Recommend 24 hour delayed film. Electronically Signed   By: Narda Rutherford M.D.   On: 06/02/2023 22:46   DG Abdomen 1 View Result Date: 06/02/2023 CLINICAL DATA:  NG tube placement. EXAM: ABDOMEN - 1  VIEW COMPARISON:  Radiograph earlier today FINDINGS: The enteric tube is now looped in the distal esophagus, the side port is near the gastroesophageal junction, the tip remains directed cranially. There is marked gaseous gastric distension is well as small bowel dilatation in the upper abdomen. IMPRESSION: 1. Enteric tube is now looped in the distal esophagus, the side port is near the gastroesophageal junction, the tip remains directed cranially. Recommend repositioning. 2. Marked gaseous gastric distension and small bowel dilatation in the upper abdomen. Electronically Signed   By: Narda Rutherford M.D.   On: 06/02/2023 10:51   DG Abdomen 1 View Addendum Date: 06/02/2023 ADDENDUM REPORT: 06/02/2023 07:34 ADDENDUM: These results were called by telephone at the time of interpretation on 06/02/2023 at 7:33 am to provider Dr. Tonette Lederer, who verbally acknowledged these results. Electronically Signed   By: Signa Kell M.D.   On: 06/02/2023 07:34   Result Date: 06/02/2023 CLINICAL DATA:  Status post NG tube placement. EXAM: ABDOMEN - 1 VIEW COMPARISON:  CT from earlier today FINDINGS: The enteric tube is looped within the esophagus. The tip and side port are oriented towards the mouth within the proximal and mid esophagus. Recommend repositioning. Lung volumes are low. There is diffuse gaseous distension of the stomach and small bowel loops compatible with small bowel obstruction. Visualized  osseous structures are normal. Cholecystectomy clips. IMPRESSION: 1. The enteric tube is looped within the esophagus. The tip and side port are oriented towards the mouth within the proximal and mid esophagus. Recommend repositioning. 2. Diffuse gaseous distension of the stomach and small bowel loops compatible with small bowel obstruction. Electronically Signed: By: Signa Kell M.D. On: 06/02/2023 07:02   CT ABDOMEN PELVIS W CONTRAST Result Date: 06/02/2023 CLINICAL DATA:  Abdominal pain with nausea and vomiting EXAM: CT ABDOMEN AND PELVIS WITH CONTRAST TECHNIQUE: Multidetector CT imaging of the abdomen and pelvis was performed using the standard protocol following bolus administration of intravenous contrast. RADIATION DOSE REDUCTION: This exam was performed according to the departmental dose-optimization program which includes automated exposure control, adjustment of the mA and/or kV according to patient size and/or use of iterative reconstruction technique. CONTRAST:  OMNIPAQUE IOHEXOL 300 MG/ML  SOLN COMPARISON:  None Available. FINDINGS: Lower Chest: Bibasilar atelectasis Hepatobiliary: Multiple hypodensities within the liver, most consistent with hepatic cysts. Status post cholecystectomy. Pancreas: Normal pancreas. No ductal dilatation or peripancreatic fluid collection. Spleen: Multiple cystic densities within the spleen. Adrenals/Urinary Tract: The adrenal glands are normal. No hydronephrosis, nephroureterolithiasis or solid renal mass. Stomach/Bowel: There is no hiatal hernia. Normal duodenal course and caliber. Diffusely dilated and fluid-filled small bowel. Suspected transition point at the terminal ileum. There is mild rectal wall thickening. There is a large amount of distal colonic stool. Vascular/Lymphatic: There is calcific atherosclerosis of the abdominal aorta. No lymphadenopathy. Reproductive: Suprapubic catheter.  Normal sized prostate. Other: None. Musculoskeletal: No bony spinal  canal stenosis or focal osseous abnormality. IMPRESSION: 1. Diffusely dilated and fluid-filled small bowel with suspected transition point at the terminal ileum. Findings are consistent with small bowel obstruction. 2. Mild rectal wall thickening, which may indicate proctitis. 3. Large amount of distal colonic stool. Electronically Signed   By: Deatra Robinson M.D.   On: 06/02/2023 04:02      Labs: BNP (last 3 results) No results for input(s): "BNP" in the last 8760 hours. Basic Metabolic Panel: Recent Labs  Lab 06/06/23 0500 06/07/23 0235 06/07/23 1824 06/08/23 0445 06/09/23 0336 06/10/23 0538  NA 139 130* 133* 132* 129* 131*  K 3.6 3.6 4.8 4.0 4.4 4.2  CL 108 101  --  104 104 101  CO2 27 22  --  22 22 23   GLUCOSE 131* 126*  --  142* 148* 129*  BUN 12 12  --  15 21 21   CREATININE 0.35* 0.34*  --  0.34* 0.40* 0.34*  CALCIUM 8.0* 8.1*  --  8.6* 8.3* 8.6*  MG 2.1 2.0  --  2.2 1.9 1.9  PHOS 1.5* 1.6* 3.8 2.5 2.8 3.0   Liver Function Tests: Recent Labs  Lab 06/05/23 0434 06/10/23 0538  AST 31 23  ALT 32 55*  ALKPHOS 39 45  BILITOT 0.9 0.4  PROT 6.6 7.0  ALBUMIN 2.8* 3.1*   No results for input(s): "LIPASE", "AMYLASE" in the last 168 hours. No results for input(s): "AMMONIA" in the last 168 hours. CBC: Recent Labs  Lab 06/05/23 0434 06/06/23 1730 06/07/23 0235 06/08/23 0445  WBC 7.5 7.5 7.4 5.8  HGB 11.0* 11.6* 11.1* 12.0*  HCT 33.1* 33.9* 31.6* 34.1*  MCV 93.8 91.6 91.3 90.9  PLT 201 195 212 197   Cardiac Enzymes: No results for input(s): "CKTOTAL", "CKMB", "CKMBINDEX", "TROPONINI" in the last 168 hours. BNP: Invalid input(s): "POCBNP" CBG: Recent Labs  Lab 06/10/23 0023 06/10/23 0550 06/10/23 1148 06/10/23 1803 06/11/23 0434  GLUCAP 150* 125* 145* 144* 112*   D-Dimer No results for input(s): "DDIMER" in the last 72 hours. Hgb A1c No results for input(s): "HGBA1C" in the last 72 hours. Lipid Profile Recent Labs    06/09/23 0336  TRIG 129    Thyroid function studies No results for input(s): "TSH", "T4TOTAL", "T3FREE", "THYROIDAB" in the last 72 hours.  Invalid input(s): "FREET3" Anemia work up No results for input(s): "VITAMINB12", "FOLATE", "FERRITIN", "TIBC", "IRON", "RETICCTPCT" in the last 72 hours. Urinalysis    Component Value Date/Time   COLORURINE AMBER (A) 06/02/2023 0155   APPEARANCEUR TURBID (A) 06/02/2023 0155   LABSPEC 1.015 06/02/2023 0155   PHURINE 5.0 06/02/2023 0155   GLUCOSEU NEGATIVE 06/02/2023 0155   HGBUR NEGATIVE 06/02/2023 0155   BILIRUBINUR NEGATIVE 06/02/2023 0155   KETONESUR NEGATIVE 06/02/2023 0155   PROTEINUR 30 (A) 06/02/2023 0155   NITRITE NEGATIVE 06/02/2023 0155   LEUKOCYTESUR LARGE (A) 06/02/2023 0155   Sepsis Labs Recent Labs  Lab 06/05/23 0434 06/06/23 1730 06/07/23 0235 06/08/23 0445  WBC 7.5 7.5 7.4 5.8   Microbiology Recent Results (from the past 240 hours)  Blood culture (routine x 2)     Status: None   Collection Time: 06/02/23  3:22 AM   Specimen: BLOOD  Result Value Ref Range Status   Specimen Description BLOOD LEFT HAND  Final   Special Requests   Final    BOTTLES DRAWN AEROBIC AND ANAEROBIC Blood Culture results may not be optimal due to an inadequate volume of blood received in culture bottles   Culture   Final    NO GROWTH 5 DAYS Performed at [facility], [address] [place], [place] [address]    Report Status 06/07/2023 FINAL  Final  Blood culture (routine x 2)     Status: None   Collection Time: 06/02/23  3:29 AM   Specimen: BLOOD  Result Value Ref Range Status   Specimen Description BLOOD RIGHT WRIST  Final   Special Requests   Final    BOTTLES DRAWN AEROBIC AND ANAEROBIC Blood Culture results may not be optimal due to an inadequate volume of blood received in culture bottles   Culture  Final    NO GROWTH 5 DAYS Performed at [facility], [address] [place], [place] [address]    Report Status 06/07/2023 FINAL  Final      Total time spend on discharging this patient, including the last patient exam, discussing the hospital stay, instructions for ongoing care as it relates to all pertinent caregivers, as well as preparing the medical discharge records, prescriptions, and/or referrals as applicable, is 35 minutes.    Darlin Priestly, MD  [facility] 06/11/2023, 10:53 AM

## 2023-07-12 ENCOUNTER — Telehealth: Payer: Self-pay | Admitting: Urology

## 2023-09-20 ENCOUNTER — Telehealth: Admitting: Urology

## 2023-11-24 ENCOUNTER — Telehealth: Payer: Self-pay

## 2023-11-24 DIAGNOSIS — R339 Retention of urine, unspecified: Secondary | ICD-10-CM

## 2023-11-24 MED ORDER — OXYBUTYNIN CHLORIDE ER 15 MG PO TB24: 15.0000 mg | ORAL_TABLET | Freq: Every day | ORAL | 0 refills | Status: AC

## 2023-11-24 NOTE — Telephone Encounter (Signed)
 Refilled pts Oxybutynin  for 30 days to get him to his video visit on 9/15.

## 2023-12-06 ENCOUNTER — Telehealth: Admitting: Urology

## 2023-12-06 DIAGNOSIS — Z960 Presence of urogenital implants: Secondary | ICD-10-CM | POA: Diagnosis not present

## 2023-12-06 DIAGNOSIS — N3941 Urge incontinence: Secondary | ICD-10-CM | POA: Diagnosis not present

## 2023-12-06 MED ORDER — OXYBUTYNIN CHLORIDE ER 15 MG PO TB24: 15.0000 mg | ORAL_TABLET | Freq: Every day | ORAL | 3 refills | Status: AC

## 2023-12-06 NOTE — Progress Notes (Addendum)
 12/06/2023 1:17 PM   Ronald Santiago Sep 12, 1945 [id number]  Referring provider: Epifanio Ronald SQUIBB, MD [address] [place],  [place] [address]  No chief complaint on file.   HPI: This was a telehealth visit I was consulted to assess the patient who has a chronic suprapubic tube since December 2020 placed in [place].  Home health changes it uneventfully monthly.  On March 29 when it was changed the patient had blood from the penis blood in the urine and pain.  The bleeding gradually settle.  Bleeding came back April 4 and went to the emergency room.  He had a CT scan was placed on Levaquin  which is now finished.  With certain movements or deep breath he feels some burning at the tip of the penis.  Otherwise he is back to baseline.  Sometimes his abdomen gets distended and then it goes down.  The catheter may or may not be draining well at the time but overall it appears to be draining well.   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  He has chronic sacral wounds.  He went to the emergency room with abdominal pain and blood from the penis.  CT scan demonstrated suprapubic catheter in place.  Mild bladder wall thickening.  No overdistended.  He had a positive urine culture sensitive to ciprofloxacin  and no sensitivity done on Levaquin .  No other oral antibiotics sensitive to     Patient is doing well and should continue with suprapubic catheter changes.  Discomfort at tip of penis may be from residual cystitis with referred discomfort.  Because of the sensitivity I gave ciprofloxacin  for another week.  We sent a urine for culture recognizing he could be colonized.  Recheck patient again in 6 months.   Today On January 24, 2021 he went to the emergency room with a clogged suprapubic catheter.  This occurs soon after nurse to change the tube. Wife irrigated unsuccessfully.  Clinically never had UTI symptoms.  Had a positive culture.  Is just finishing Augmentin .   Currently is asymptomatic in regards to any infection.  Tube is draining well.  Still on oxybutynin .  Had a normal CT scan April 2022 and [facility] recommended annual ultrasound   I did not want to retreat the positive culture to cause resistance.  He is asymptomatic.  I will see him in 6 months with a annual renal ultrasound.  90x3 oxybutynin  sent to pharmacy.  Continue to change suprapubic tube monthly and irrigate at least once a week by his wife     Patient saw Dr. Carolee May 11, 2021.  I read his note and he was able to negotiate a suprapubic tube into the bladder.  He placed a 16 French silicone catheter into the bladder.  It was secured with nylon stitch.   The patient has a home health nurse to look after his sacral ulcers once a week and change the suprapubic once a month.  It clogged up 2 weeks ago and his wife changed it   He had a CT scan April 10, 2021 and his kidneys were within normal limits  Continue with monthly protocol. He agreed. I will see in a year.   Today I last saw the patient in 2023.  Patient had a CT scan March 2025 within normal limits.  Patient had 1-2 bladder infections in the last year.  One of them was when he was in the hospital and I am not convinced he was symptomatic.  He  is on oxybutynin  ER 15 mg once a day.  From [place] he was on 10 mg 3 times a day and we spoke about this today.  Clinically not infected today.  Gets suprapubic tube change once a month by home health care.  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  Overall stable.   PMH: Past Medical History:  Diagnosis Date   Acute hypoxic respiratory failure (HCC) 11/30/2022   Chronic kidney disease    Leukocytosis 11/30/2022   Multiple sclerosis, primary progressive (HCC)    Sepsis (HCC) 12/02/2022    Surgical History: Past Surgical History:  Procedure Laterality Date   BACK SURGERY     CHOLECYSTECTOMY     EYE SURGERY      Home Medications:  Allergies as of 12/06/2023       Reactions   Cephalexin   Anaphylaxis, Hives   Pt tolerated several doses of cefepime  during hospitalization in 03/2021. Also received ceftriaxone , ampicillin /sulbactam and amoxicillin /clavulanate in past        Medication List        Accurate as of December 06, 2023  1:17 PM. If you have any questions, ask your nurse or doctor.          baclofen  20 MG tablet Commonly known as: LIORESAL  Take 20-40 mg by mouth 4 (four) times daily.   bisacodyl  5 MG EC tablet Commonly known as: DULCOLAX Take 5 mg by mouth daily as needed for moderate constipation.   cetirizine 10 MG tablet Commonly known as: ZYRTEC Take 10 mg by mouth every other day. Rotates this out with Claritin  every other day   clonazePAM  1 MG tablet Commonly known as: KLONOPIN  Take 1 tablet (1 mg total) by mouth at bedtime as needed. Home med.   D3-1000 PO Take 1,000 Units by mouth daily.   docusate sodium  100 MG capsule Commonly known as: COLACE Take 400 mg by mouth at bedtime.   feeding supplement Liqd Take 237 mLs by mouth 2 (two) times daily between meals.   gabapentin  600 MG tablet Commonly known as: NEURONTIN  Take 1,200 mg by mouth 3 (three) times daily.   guaiFENesin  600 MG 12 hr tablet Commonly known as: MUCINEX  Take 600 mg by mouth 2 (two) times daily as needed for cough or to loosen phlegm.   loratadine  10 MG tablet Commonly known as: CLARITIN  Take 10 mg by mouth every other day.   magnesium  oxide 400 MG tablet Commonly known as: MAG-OX Take 1 tablet by mouth daily.   Melatonin 10 MG Tabs Take 10 mg by mouth daily as needed.   Movantik  25 MG Tabs tablet Generic drug: naloxegol  oxalate Take 25 mg by mouth daily.   multivitamin with minerals Tabs tablet Take 1 tablet by mouth daily.   nystatin  powder Commonly known as: MYCOSTATIN /NYSTOP  SMARTSIG:1 Application Topical 2-3 Times Daily   oxybutynin  15 MG 24 hr tablet Commonly known as: DITROPAN  XL Take 1 tablet (15 mg total) by mouth daily.   oxyCODONE  5 MG  immediate release tablet Commonly known as: Oxy IR/ROXICODONE  Take 5 mg by mouth every 4 (four) hours as needed for moderate pain (pain score 4-6) or severe pain (pain score 7-10).   phenylephrine 10 MG Tabs tablet Commonly known as: SUDAFED PE Take 10 mg by mouth every 4 (four) hours as needed.   polyethylene glycol 17 g packet Commonly known as: MIRALAX  / GLYCOLAX  Take 17 g by mouth 2 (two) times daily.   PROBIOTIC-10 PO Take 1 capsule by mouth daily.   ZINC  PLUS  VITAMIN C  PO Take 1 tablet by mouth 2 (two) times daily.        Allergies:  Allergies  Allergen Reactions   Cephalexin  Anaphylaxis and Hives    Pt tolerated several doses of cefepime  during hospitalization in 03/2021. Also received ceftriaxone , ampicillin /sulbactam and amoxicillin /clavulanate in past    Family History: No family history on file.  Social History:  reports that he has never smoked. He has never been exposed to tobacco smoke. He has never used smokeless tobacco. He reports that he does not drink alcohol and does not use drugs.  ROS:                                        Physical Exam: There were no vitals taken for this visit.  Constitutional:  Alert and oriented, No acute distress. HEENT: [place] AT, moist mucus membranes.  Trachea midline, no masses.   Laboratory Data: Lab Results  Component Value Date   WBC 5.8 06/08/2023   HGB 12.0 (L) 06/08/2023   HCT 34.1 (L) 06/08/2023   MCV 90.9 06/08/2023   PLT 197 06/08/2023    Lab Results  Component Value Date   CREATININE 0.34 (L) 06/10/2023    No results found for: PSA  No results found for: TESTOSTERONE  Lab Results  Component Value Date   HGBA1C 5.2 06/06/2023    Urinalysis    Component Value Date/Time   COLORURINE AMBER (A) 06/02/2023 0155   APPEARANCEUR TURBID (A) 06/02/2023 0155   LABSPEC 1.015 06/02/2023 0155   PHURINE 5.0 06/02/2023 0155   GLUCOSEU NEGATIVE 06/02/2023 0155   HGBUR NEGATIVE  06/02/2023 0155   BILIRUBINUR NEGATIVE 06/02/2023 0155   KETONESUR NEGATIVE 06/02/2023 0155   PROTEINUR 30 (A) 06/02/2023 0155   NITRITE NEGATIVE 06/02/2023 0155   LEUKOCYTESUR LARGE (A) 06/02/2023 0155    Pertinent Imaging:   Assessment & Plan: Oxybutynin  ER 15 mg 90 x 3 sent to pharmacy and I will see in a year  The telemetry health visit was done with the patient home.  At that time he was bed ridden.  I was in my office in [place].  The method of the telehealth was MyChart and electronic medical records at [facility]  There are no diagnoses linked to this encounter.  No follow-ups on file.  Glendia DELENA Elizabeth, MD  [facility] [address], [address] [place], [place] [address] [phone]

## 2024-12-04 ENCOUNTER — Telehealth: Admitting: Urology
